# Patient Record
Sex: Female | Born: 1965 | Race: White | Hispanic: No | Smoking: Never smoker
Health system: Southern US, Community
[De-identification: ages and names within clinical notes are randomized; demographics above are authoritative.]

## PROBLEM LIST (undated history)

## (undated) ENCOUNTER — Ambulatory Visit

## (undated) DIAGNOSIS — E119 Type 2 diabetes mellitus without complications: Secondary | ICD-10-CM

## (undated) DIAGNOSIS — E079 Disorder of thyroid, unspecified: Secondary | ICD-10-CM

## (undated) DIAGNOSIS — D649 Anemia, unspecified: Secondary | ICD-10-CM

## (undated) DIAGNOSIS — K5900 Constipation, unspecified: Secondary | ICD-10-CM

## (undated) DIAGNOSIS — R232 Flushing: Secondary | ICD-10-CM

## (undated) DIAGNOSIS — E039 Hypothyroidism, unspecified: Secondary | ICD-10-CM

## (undated) DIAGNOSIS — F4310 Post-traumatic stress disorder, unspecified: Secondary | ICD-10-CM

## (undated) DIAGNOSIS — D251 Intramural leiomyoma of uterus: Secondary | ICD-10-CM

## (undated) DIAGNOSIS — K859 Acute pancreatitis without necrosis or infection, unspecified: Secondary | ICD-10-CM

## (undated) DIAGNOSIS — I10 Essential (primary) hypertension: Secondary | ICD-10-CM

## (undated) DIAGNOSIS — F32A Depression, unspecified: Secondary | ICD-10-CM

## (undated) DIAGNOSIS — R12 Heartburn: Secondary | ICD-10-CM

## (undated) DIAGNOSIS — N3941 Urge incontinence: Secondary | ICD-10-CM

## (undated) DIAGNOSIS — K219 Gastro-esophageal reflux disease without esophagitis: Secondary | ICD-10-CM

## (undated) DIAGNOSIS — R112 Nausea with vomiting, unspecified: Secondary | ICD-10-CM

## (undated) DIAGNOSIS — Z9889 Other specified postprocedural states: Secondary | ICD-10-CM

## (undated) DIAGNOSIS — F419 Anxiety disorder, unspecified: Secondary | ICD-10-CM

## (undated) DIAGNOSIS — G35 Multiple sclerosis: Secondary | ICD-10-CM

## (undated) DIAGNOSIS — N3946 Mixed incontinence: Secondary | ICD-10-CM

## (undated) DIAGNOSIS — N939 Abnormal uterine and vaginal bleeding, unspecified: Secondary | ICD-10-CM

## (undated) DIAGNOSIS — T1490XA Injury, unspecified, initial encounter: Secondary | ICD-10-CM

## (undated) DIAGNOSIS — F329 Major depressive disorder, single episode, unspecified: Secondary | ICD-10-CM

## (undated) DIAGNOSIS — R7303 Prediabetes: Secondary | ICD-10-CM

## (undated) HISTORY — PX: TUBAL LIGATION: SHX77

## (undated) HISTORY — DX: Heartburn: R12

## (undated) HISTORY — DX: Depression, unspecified: F32.A

## (undated) HISTORY — DX: Type 2 diabetes mellitus without complications: E11.9

## (undated) HISTORY — DX: Mixed incontinence: N39.46

## (undated) HISTORY — DX: Anxiety disorder, unspecified: F41.9

## (undated) HISTORY — DX: Flushing: R23.2

## (undated) HISTORY — DX: Gastro-esophageal reflux disease without esophagitis: K21.9

## (undated) HISTORY — DX: Acute pancreatitis without necrosis or infection, unspecified: K85.90

## (undated) HISTORY — DX: Injury, unspecified, initial encounter: T14.90XA

## (undated) HISTORY — DX: Abnormal uterine and vaginal bleeding, unspecified: N93.9

## (undated) HISTORY — DX: Disorder of thyroid, unspecified: E07.9

## (undated) HISTORY — DX: Major depressive disorder, single episode, unspecified: F32.9

## (undated) HISTORY — PX: ENDOMETRIAL ABLATION: SHX621

## (undated) HISTORY — DX: Urge incontinence: N39.41

## (undated) HISTORY — DX: Post-traumatic stress disorder, unspecified: F43.10

## (undated) HISTORY — DX: Constipation, unspecified: K59.00

## (undated) HISTORY — PX: INCONTINENCE SURGERY: SHX676

## (undated) HISTORY — DX: Intramural leiomyoma of uterus: D25.1

## (undated) HISTORY — DX: Multiple sclerosis: G35

---

## 2008-04-03 ENCOUNTER — Other Ambulatory Visit: Admission: RE | Admit: 2008-04-03 | Discharge: 2008-04-03 | Payer: Self-pay | Admitting: Obstetrics & Gynecology

## 2008-05-04 ENCOUNTER — Ambulatory Visit (HOSPITAL_COMMUNITY): Admission: RE | Admit: 2008-05-04 | Discharge: 2008-05-04 | Payer: Self-pay | Admitting: Internal Medicine

## 2008-06-17 ENCOUNTER — Ambulatory Visit (HOSPITAL_COMMUNITY): Admission: RE | Admit: 2008-06-17 | Discharge: 2008-06-17 | Payer: Self-pay | Admitting: Obstetrics & Gynecology

## 2008-06-17 ENCOUNTER — Encounter: Payer: Self-pay | Admitting: Obstetrics & Gynecology

## 2008-10-13 ENCOUNTER — Ambulatory Visit (HOSPITAL_COMMUNITY): Admission: RE | Admit: 2008-10-13 | Discharge: 2008-10-13 | Payer: Self-pay | Admitting: Urology

## 2008-10-13 ENCOUNTER — Encounter (INDEPENDENT_AMBULATORY_CARE_PROVIDER_SITE_OTHER): Payer: Self-pay | Admitting: Urology

## 2009-01-14 ENCOUNTER — Ambulatory Visit (HOSPITAL_COMMUNITY): Admission: RE | Admit: 2009-01-14 | Discharge: 2009-01-14 | Payer: Self-pay | Admitting: Internal Medicine

## 2009-02-05 ENCOUNTER — Emergency Department (HOSPITAL_COMMUNITY): Admission: EM | Admit: 2009-02-05 | Discharge: 2009-02-05 | Payer: Self-pay | Admitting: Emergency Medicine

## 2009-02-24 ENCOUNTER — Ambulatory Visit (HOSPITAL_COMMUNITY): Admission: RE | Admit: 2009-02-24 | Discharge: 2009-02-24 | Payer: Self-pay | Admitting: Internal Medicine

## 2010-02-22 ENCOUNTER — Ambulatory Visit (HOSPITAL_COMMUNITY): Admission: RE | Admit: 2010-02-22 | Discharge: 2010-02-22 | Payer: Self-pay | Admitting: Urology

## 2010-03-03 ENCOUNTER — Emergency Department (HOSPITAL_COMMUNITY): Admission: EM | Admit: 2010-03-03 | Discharge: 2010-03-03 | Payer: Self-pay | Admitting: Emergency Medicine

## 2010-06-10 ENCOUNTER — Other Ambulatory Visit: Admission: RE | Admit: 2010-06-10 | Discharge: 2010-06-10 | Payer: Self-pay | Admitting: Obstetrics & Gynecology

## 2010-06-17 ENCOUNTER — Ambulatory Visit (HOSPITAL_COMMUNITY): Admission: RE | Admit: 2010-06-17 | Discharge: 2010-06-17 | Payer: Self-pay | Admitting: Internal Medicine

## 2010-10-14 LAB — DIFFERENTIAL
Lymphocytes Relative: 27 % (ref 12–46)
Lymphs Abs: 1.8 10*3/uL (ref 0.7–4.0)
Monocytes Absolute: 0.5 10*3/uL (ref 0.1–1.0)
Monocytes Relative: 7 % (ref 3–12)
Neutro Abs: 4.4 10*3/uL (ref 1.7–7.7)
Neutrophils Relative %: 64 % (ref 43–77)

## 2010-10-14 LAB — BASIC METABOLIC PANEL
Calcium: 8.8 mg/dL (ref 8.4–10.5)
GFR calc Af Amer: >60 mL/min (ref >60)
GFR calc non Af Amer: >60 mL/min (ref >60)
Glucose, Bld: 97 mg/dL (ref 70–99)
Potassium: 3.5 mEq/L (ref 3.5–5.1)
Sodium: 139 mEq/L (ref 135–145)

## 2010-10-14 LAB — URINALYSIS, ROUTINE W REFLEX MICROSCOPIC
Glucose, UA: NEGATIVE mg/dL
Leukocytes, UA: NEGATIVE
Specific Gravity, Urine: >1.03 — ABNORMAL HIGH (ref 1.005–1.030)
Urobilinogen, UA: 0.2 mg/dL (ref 0.0–1.0)

## 2010-10-14 LAB — CBC
HCT: 31.9 % — ABNORMAL LOW (ref 36.0–46.0)
Hemoglobin: 10.9 g/dL — ABNORMAL LOW (ref 12.0–15.0)
RBC: 3.57 MIL/uL — ABNORMAL LOW (ref 3.87–5.11)
WBC: 6.8 10*3/uL (ref 4.0–10.5)

## 2010-10-14 LAB — URINE CULTURE

## 2010-10-14 LAB — URINE MICROSCOPIC-ADD ON

## 2010-10-15 LAB — CBC
HCT: 33.8 % — ABNORMAL LOW (ref 36.0–46.0)
Hemoglobin: 11.4 g/dL — ABNORMAL LOW (ref 12.0–15.0)
MCHC: 33.6 g/dL (ref 30.0–36.0)
MCV: 89.6 fL (ref 78.0–100.0)
RDW: 14.1 % (ref 11.5–15.5)

## 2010-10-15 LAB — BASIC METABOLIC PANEL
BUN: 11 mg/dL (ref 6–23)
Chloride: 107 mEq/L (ref 96–112)
GFR calc non Af Amer: >60 mL/min (ref >60)
Glucose, Bld: 105 mg/dL — ABNORMAL HIGH (ref 70–99)
Potassium: 4 mEq/L (ref 3.5–5.1)
Sodium: 139 mEq/L (ref 135–145)

## 2010-10-15 LAB — SURGICAL PCR SCREEN: MRSA, PCR: NEGATIVE

## 2010-11-06 LAB — CBC
Platelets: 224 10*3/uL (ref 150–400)
RDW: 12.8 % (ref 11.5–15.5)
WBC: 6.4 10*3/uL (ref 4.0–10.5)

## 2010-11-06 LAB — URINE MICROSCOPIC-ADD ON

## 2010-11-06 LAB — URINALYSIS, ROUTINE W REFLEX MICROSCOPIC
Bilirubin Urine: NEGATIVE
Glucose, UA: NEGATIVE mg/dL
Ketones, ur: NEGATIVE mg/dL
Nitrite: NEGATIVE
Specific Gravity, Urine: >1.03 — ABNORMAL HIGH (ref 1.005–1.030)
pH: 5.5 (ref 5.0–8.0)

## 2010-11-06 LAB — BASIC METABOLIC PANEL
BUN: 8 mg/dL (ref 6–23)
Calcium: 9.5 mg/dL (ref 8.4–10.5)
Creatinine, Ser: 0.75 mg/dL (ref 0.4–1.2)
GFR calc non Af Amer: >60 mL/min (ref >60)
Glucose, Bld: 90 mg/dL (ref 70–99)

## 2010-11-06 LAB — DIFFERENTIAL
Basophils Absolute: 0 10*3/uL (ref 0.0–0.1)
Eosinophils Relative: 1 % (ref 0–5)
Lymphocytes Relative: 26 % (ref 12–46)
Lymphs Abs: 1.7 10*3/uL (ref 0.7–4.0)
Neutro Abs: 4.2 10*3/uL (ref 1.7–7.7)
Neutrophils Relative %: 66 % (ref 43–77)

## 2010-11-06 LAB — URINE CULTURE: Colony Count: 70000

## 2010-11-06 LAB — GLUCOSE, CAPILLARY: Glucose-Capillary: 89 mg/dL (ref 70–99)

## 2010-11-10 LAB — BASIC METABOLIC PANEL
BUN: 12 mg/dL (ref 6–23)
CO2: 28 mEq/L (ref 19–32)
Calcium: 9.5 mg/dL (ref 8.4–10.5)
Glucose, Bld: 87 mg/dL (ref 70–99)
Sodium: 137 mEq/L (ref 135–145)

## 2010-12-13 NOTE — Op Note (Signed)
Dawn Barnes, Dawn Barnes                  ACCOUNT NO.:  [id number]   MEDICAL RECORD NO.:  [id number]          PATIENT TYPE:  AMB   LOCATION:  DAY                           FACILITY:  [facility]   PHYSICIAN:  Ky Barban, M.D.DATE OF BIRTH:  1966/05/11   DATE OF PROCEDURE:  10/13/2008  DATE OF DISCHARGE:                               OPERATIVE REPORT   PREOPERATIVE DIAGNOSES:  Periurethral cyst and stress urinary  incontinence.   POSTOPERATIVE DIAGNOSES:  Periurethral cyst and stress urinary  incontinence.   PROCEDURE:  Excision of periurethral cyst and MiniArc.   ANESTHESIA:  General.   PROCEDURE IN DETAIL:  The patient under general endotracheal anesthesia  in lithotomy position after usual prep and drape, #20 Foley catheter  introduced into the bladder and the periurethral cyst, which was about 1  cm in size was located alongside the distal urethra next to the meatus  on the right side and a small 0.5-cm incision on top of the cyst is  made.  The cyst was dissected with blunt and sharp dissection, was  removed from the tissues.  The cyst was removed and then the bleeders  were coagulated and the periurethral tissue was closed with a couple of  stitches of 3-0 Vicryl to get hemostasis.  Then the mucosa was closed  with a couple of stitches of 3-0 Vicryl.  Then I proceeded to the  MiniArc procedure.  A 1-cm incision on top of the mid urethra was made  and periurethral tissues developed on the right and left side of the  urethra and through this periurethral area, first I introduced the  special curved needle with MiniArc, tape on it on the right side and  pushed it down alongside the pubic ramus going up into the upper part  near the obturator fossa and pushed into the obturator fossa just  hugging the lower end of the fossa.  The tape was in place and the  needle was removed and the other end of the tape was introduced on the  left side in the same fashion, nicely snugged tape to the  urethra was  obtained.  At this point, the wound was irrigated and the urethral  incision was closed with a couple of stitches of 3-0 Vicryl.  Foley  catheter was removed.  The patient left the operating room in  satisfactory condition.      Ky Barban, M.D.  Electronically Signed     MIJ/MEDQ  D:  10/13/2008  T:  10/14/2008  Job:  [id number]

## 2010-12-13 NOTE — H&P (Signed)
NAMEMORENIKE, CUFF                  ACCOUNT NO.:  [id number]   MEDICAL RECORD NO.:  [id number]          PATIENT TYPE:  AMB   LOCATION:  DAY                           FACILITY:  [facility]   PHYSICIAN:  Ky Barban, M.D.DATE OF BIRTH:  [birth year]   DATE OF ADMISSION:  DATE OF DISCHARGE:  LH                              HISTORY & PHYSICAL   CHIEF COMPLAINT:  Stress urinary incontinence.   A 45 year old female has been followed by me since 2008.  She was having  mostly urge incontinence treated well with anticholinergic drugs but  lately she has developed worsening stress incontinence.  Recently I  worked her up.  She has marked stress incontinence which can go away  with the elevation of the bladder neck.  She also has a small  periurethral cyst so I told her that we need to remove the cyst and do a  MiniArc procedure.  It will correct her stress incontinence but not urge  incontinence for which she will keep taking anticholinergic drugs okay  and I described the procedure limitation complications, no guarantees  about the results.  She understands and wanted me to go and proceed.  She is coming as outpatient.  We will do the procedure tomorrow as  outpatient.   PAST MEDICAL HISTORY:  History of having tubal ligation several years  ago.  No diabetes or hypertension.  She takes medication for  hypothyroidism and GERD.   PERSONAL HISTORY:  She does not smoke or drink.   REVIEW OF SYSTEMS:  Unremarkable.   EXAMINATION:  Blood pressure 120/80.  Temperature is normal.  CENTRAL NERVOUS SYSTEM:  Negative.  HEAD, NECK, ENT:  Negative.  CHEST:  Symmetrical.  HEART:  Regular sinus rhythm.  No murmur.  ABDOMEN:  Soft, flat.  Liver, spleen, kidneys are not palpable.  No CVA  tenderness.  PELVIC EXAM:  No adnexal mass.  She has a small cyst below the meatus.  It is nontender.  It is not a urethral diverticulum.   IMPRESSION:  1. Periurethral cyst.  2. Stress incontinence.   PLAN:   Removal of periurethral cyst and insertion of MiniArc under  anesthesia as outpatient.  She will be given 200 mg of Cipro IV on  arrival.      Ky Barban, M.D.  Electronically Signed     MIJ/MEDQ  D:  10/12/2008  T:  10/12/2008  Job:  [id number]

## 2010-12-13 NOTE — Op Note (Signed)
[name], Dawn Barnes                  ACCOUNT NO.:  [id number]   MEDICAL RECORD NO.:  [id number]          PATIENT TYPE:  AMB   LOCATION:  DAY                           FACILITY:  [facility]   PHYSICIAN:  Lazaro Arms, M.D.   DATE OF BIRTH:  [birth year]   DATE OF PROCEDURE:  06/17/2008  DATE OF DISCHARGE:                               OPERATIVE REPORT   PREOPERATIVE DIAGNOSES:  1. Menometrorrhagia.  2. Dysmenorrhea.  3. Anemia.   POSTOPERATIVE DIAGNOSES:  1. Menometrorrhagia.  2. Dysmenorrhea.  3. Anemia.   PROCEDURE:  Hysteroscopy, D and C, and endometrial ablation.   SURGEON:  Lazaro Arms, MD   ANESTHESIA:  General endotracheal.   FINDINGS:  The patient had normal endometrium except for what looked  like to be a necrotic degenerating polyp.  It could have just been a  shaggy endometrium.   DESCRIPTION OF OPERATION:  The patient was taken to the operating room,  placed in the supine position, where she underwent general endotracheal  anesthesia placed in dorsal supine position prepped and draped in usual  sterile fashion.  She was noted to have __________her bottom at the end  of the procedure.  Graves speculum was placed.  Cervix was grasped with  a dilated sleeve to allow passes of hysteroscope.  The hysteroscopy was  performed and found to be the above noted findings.  A vigorous uterine  curettage was then performed.  A ThermaChoice III endometrial ablation  balloon was used.  A 26 mL of D5W was required to maintain a pressure of  greater than 200 mmHg.  It was heated to 87 degrees Celsius.  Total  therapy time was 9 minutes and 30 seconds.  All the fluid was returned  at the end of the procedure.  The patient appeared well __________.  She  was awakened from anesthesia and taken to recovery room in good and  stable condition.  All counts were correct.  She received Ancef and  Toradol prophylactically.  __________was placed at the bottom in the  end.     Lazaro Arms, M.D.  Electronically Signed    LHE/MEDQ  D:  06/17/2008  T:  06/17/2008  Job:  [id number]

## 2010-12-30 IMAGING — CR DG TOE GREAT 2+V*R*
2 series · 2 of 2 positions shown · non-contrast
Comparison: none

CLINICAL DATA: Right great toe injury 2 days ago.  Toe pain and
bruising.

RIGHT TOE - 2+ VIEW

[view not recorded (1 of 2)]
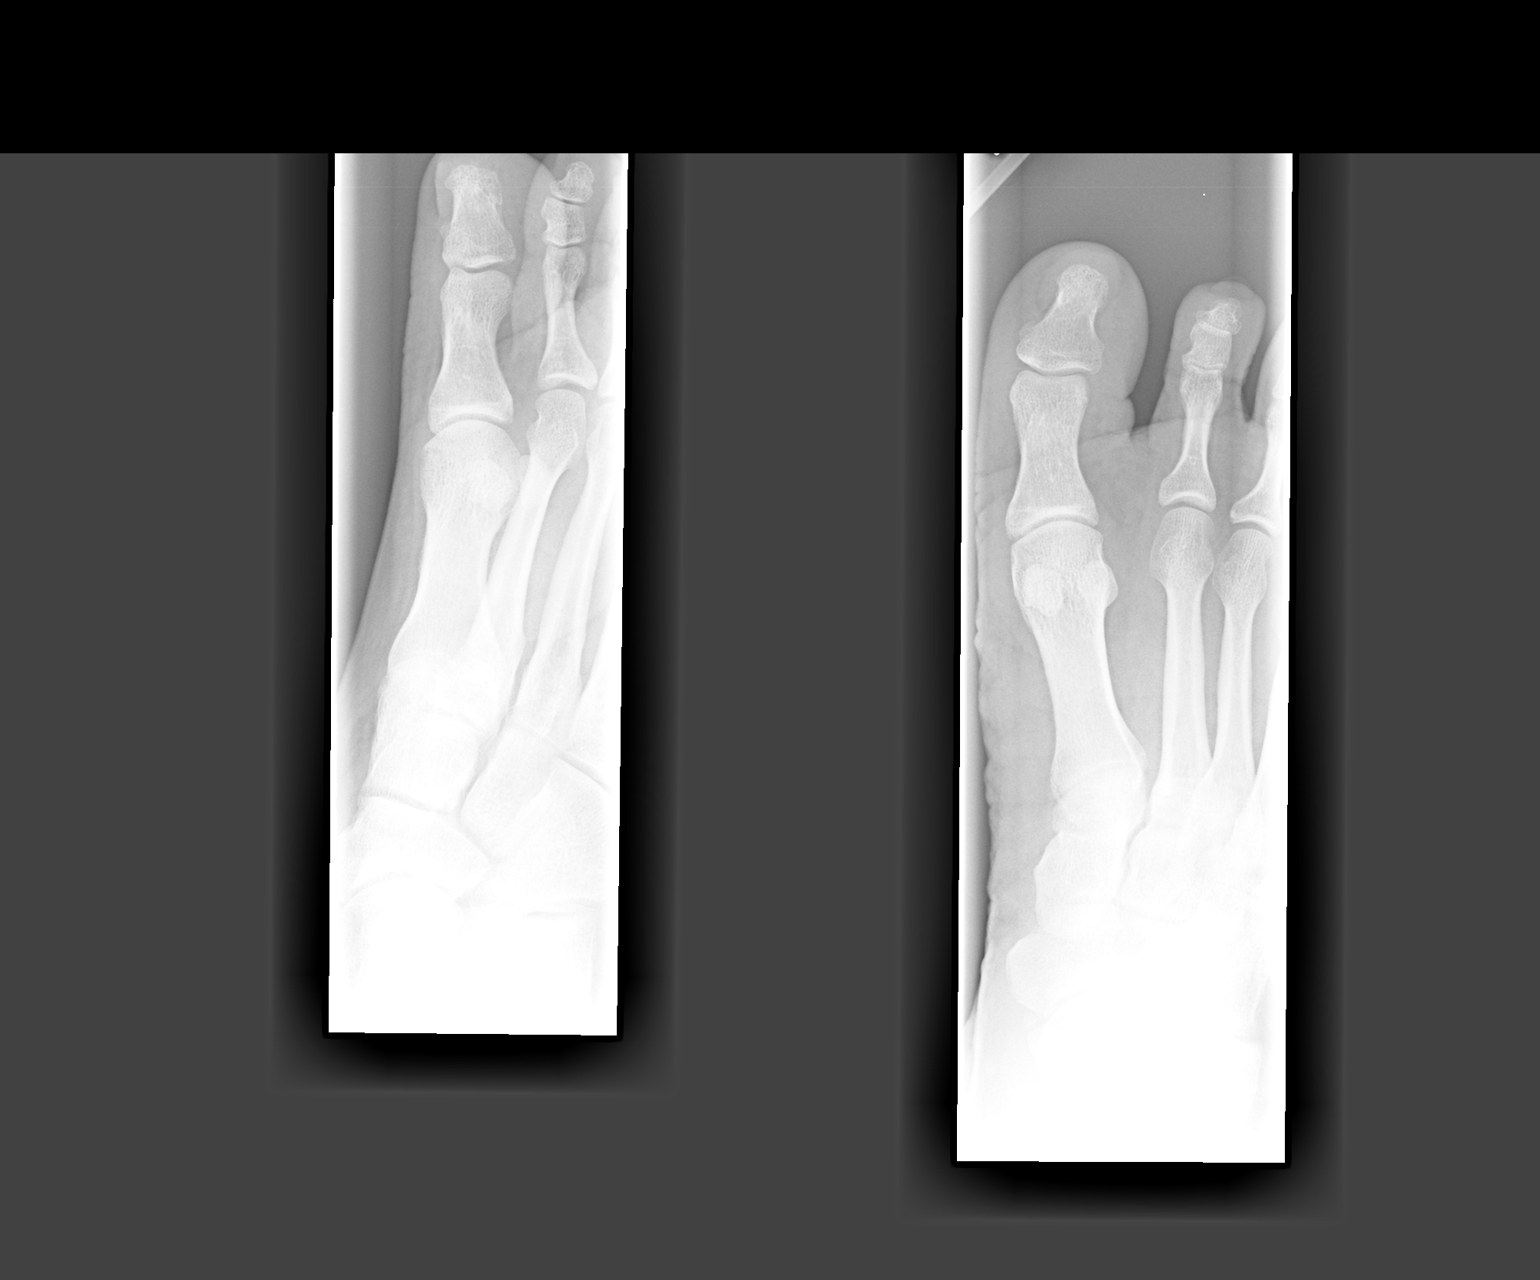

[view not recorded (2 of 2)]
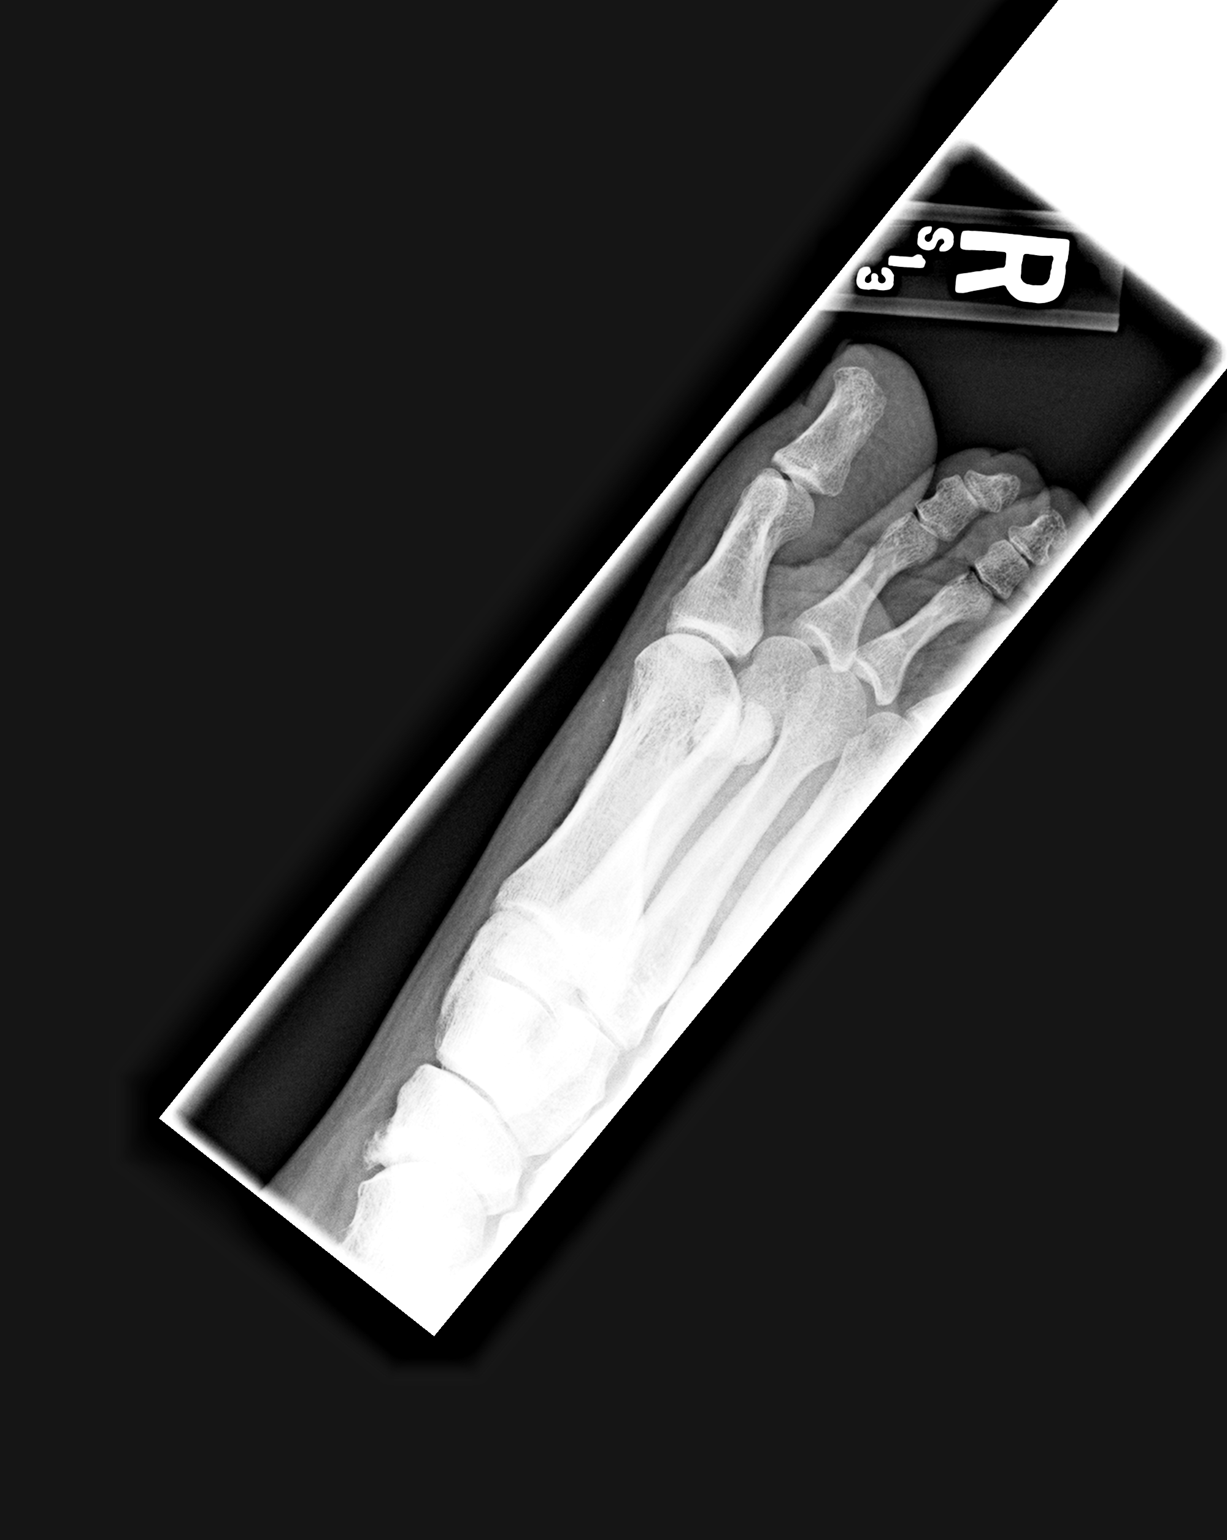

[2 of 2 positions shown; findings below may reference images not displayed]

FINDINGS: No fracture, foreign body, or acute bony findings are
identified. Accessory navicular noted.
IMPRESSION: 1.  No discrete fracture or dislocation is identified.

## 2011-03-21 ENCOUNTER — Other Ambulatory Visit (HOSPITAL_COMMUNITY): Payer: Self-pay | Admitting: Internal Medicine

## 2011-03-21 ENCOUNTER — Ambulatory Visit (HOSPITAL_COMMUNITY)
Admission: RE | Admit: 2011-03-21 | Discharge: 2011-03-21 | Disposition: A | Discharge status: Home / Self Care | Payer: Self-pay | Source: Ambulatory Visit | Attending: Internal Medicine | Admitting: Internal Medicine

## 2011-03-21 DIAGNOSIS — M79672 Pain in left foot: Secondary | ICD-10-CM

## 2011-03-21 DIAGNOSIS — M25579 Pain in unspecified ankle and joints of unspecified foot: Secondary | ICD-10-CM | POA: Insufficient documentation

## 2011-03-21 DIAGNOSIS — M898X9 Other specified disorders of bone, unspecified site: Secondary | ICD-10-CM | POA: Insufficient documentation

## 2011-05-02 LAB — CBC
Hemoglobin: 11.1 — ABNORMAL LOW
MCHC: 34.1
MCV: 86.6
RBC: 3.75 — ABNORMAL LOW
RDW: 14.7

## 2011-05-02 LAB — BASIC METABOLIC PANEL
CO2: 25
Calcium: 9.3
Chloride: 110
Creatinine, Ser: 0.67
GFR calc Af Amer: >60
Glucose, Bld: 97

## 2011-05-02 LAB — URINALYSIS, DIPSTICK ONLY
Bilirubin Urine: NEGATIVE
Nitrite: NEGATIVE
Protein, ur: NEGATIVE
Urobilinogen, UA: 0.2

## 2011-07-06 ENCOUNTER — Other Ambulatory Visit (HOSPITAL_COMMUNITY): Payer: Self-pay | Admitting: Internal Medicine

## 2011-07-06 DIAGNOSIS — Z139 Encounter for screening, unspecified: Secondary | ICD-10-CM

## 2011-07-14 ENCOUNTER — Ambulatory Visit (HOSPITAL_COMMUNITY): Payer: Medicaid Other

## 2011-08-07 ENCOUNTER — Ambulatory Visit (HOSPITAL_COMMUNITY): Payer: Medicaid Other

## 2011-09-25 ENCOUNTER — Ambulatory Visit (HOSPITAL_COMMUNITY): Payer: Medicaid Other

## 2011-10-03 ENCOUNTER — Ambulatory Visit (HOSPITAL_COMMUNITY)
Admission: RE | Admit: 2011-10-03 | Discharge: 2011-10-03 | Disposition: A | Discharge status: Home / Self Care | Payer: Medicaid Other | Source: Ambulatory Visit | Attending: Internal Medicine | Admitting: Internal Medicine

## 2011-10-03 DIAGNOSIS — Z139 Encounter for screening, unspecified: Secondary | ICD-10-CM

## 2011-10-03 DIAGNOSIS — Z1231 Encounter for screening mammogram for malignant neoplasm of breast: Secondary | ICD-10-CM | POA: Insufficient documentation

## 2011-11-09 ENCOUNTER — Other Ambulatory Visit: Payer: Self-pay | Admitting: Adult Health

## 2011-11-09 ENCOUNTER — Other Ambulatory Visit (HOSPITAL_COMMUNITY)
Admission: RE | Admit: 2011-11-09 | Discharge: 2011-11-09 | Disposition: A | Discharge status: Home / Self Care | Payer: Medicaid Other | Source: Ambulatory Visit | Attending: Obstetrics and Gynecology | Admitting: Obstetrics and Gynecology

## 2011-11-09 DIAGNOSIS — Z1159 Encounter for screening for other viral diseases: Secondary | ICD-10-CM | POA: Insufficient documentation

## 2011-11-09 DIAGNOSIS — Z01419 Encounter for gynecological examination (general) (routine) without abnormal findings: Secondary | ICD-10-CM | POA: Insufficient documentation

## 2012-03-12 ENCOUNTER — Other Ambulatory Visit (HOSPITAL_COMMUNITY): Payer: Self-pay | Admitting: Internal Medicine

## 2012-03-12 ENCOUNTER — Ambulatory Visit (HOSPITAL_COMMUNITY)
Admission: RE | Admit: 2012-03-12 | Discharge: 2012-03-12 | Disposition: A | Discharge status: Home / Self Care | Payer: Medicaid Other | Source: Ambulatory Visit | Attending: Internal Medicine | Admitting: Internal Medicine

## 2012-03-12 DIAGNOSIS — M79644 Pain in right finger(s): Secondary | ICD-10-CM

## 2012-03-12 DIAGNOSIS — M79609 Pain in unspecified limb: Secondary | ICD-10-CM | POA: Insufficient documentation

## 2012-12-25 ENCOUNTER — Other Ambulatory Visit (HOSPITAL_COMMUNITY): Payer: Self-pay | Admitting: Internal Medicine

## 2012-12-25 DIAGNOSIS — Z139 Encounter for screening, unspecified: Secondary | ICD-10-CM

## 2013-01-07 ENCOUNTER — Ambulatory Visit (HOSPITAL_COMMUNITY)
Admission: RE | Admit: 2013-01-07 | Discharge: 2013-01-07 | Disposition: A | Discharge status: Home / Self Care | Payer: Medicaid Other | Source: Ambulatory Visit | Attending: Internal Medicine | Admitting: Internal Medicine

## 2013-01-07 DIAGNOSIS — Z139 Encounter for screening, unspecified: Secondary | ICD-10-CM

## 2013-01-07 DIAGNOSIS — Z1231 Encounter for screening mammogram for malignant neoplasm of breast: Secondary | ICD-10-CM | POA: Insufficient documentation

## 2013-02-20 ENCOUNTER — Other Ambulatory Visit: Payer: Self-pay | Admitting: Adult Health

## 2013-04-25 ENCOUNTER — Other Ambulatory Visit (HOSPITAL_COMMUNITY): Payer: Self-pay | Admitting: Internal Medicine

## 2013-04-25 ENCOUNTER — Ambulatory Visit (HOSPITAL_COMMUNITY)
Admission: RE | Admit: 2013-04-25 | Discharge: 2013-04-25 | Disposition: A | Discharge status: Home / Self Care | Payer: Medicaid Other | Source: Ambulatory Visit | Attending: Internal Medicine | Admitting: Internal Medicine

## 2013-04-25 DIAGNOSIS — M549 Dorsalgia, unspecified: Secondary | ICD-10-CM

## 2013-04-25 DIAGNOSIS — R209 Unspecified disturbances of skin sensation: Secondary | ICD-10-CM | POA: Insufficient documentation

## 2013-04-25 DIAGNOSIS — M545 Low back pain, unspecified: Secondary | ICD-10-CM | POA: Insufficient documentation

## 2013-04-28 ENCOUNTER — Other Ambulatory Visit: Payer: Self-pay | Admitting: Adult Health

## 2013-04-30 ENCOUNTER — Encounter: Payer: Self-pay | Admitting: Adult Health

## 2013-04-30 ENCOUNTER — Ambulatory Visit (INDEPENDENT_AMBULATORY_CARE_PROVIDER_SITE_OTHER): Payer: Medicaid Other | Admitting: Adult Health

## 2013-04-30 VITALS — BP 130/80 | HR 74 | Ht 64.5 in | Wt 196.0 lb

## 2013-04-30 DIAGNOSIS — F32A Depression, unspecified: Secondary | ICD-10-CM | POA: Insufficient documentation

## 2013-04-30 DIAGNOSIS — F329 Major depressive disorder, single episode, unspecified: Secondary | ICD-10-CM | POA: Insufficient documentation

## 2013-04-30 DIAGNOSIS — Z0000 Encounter for general adult medical examination without abnormal findings: Secondary | ICD-10-CM

## 2013-04-30 DIAGNOSIS — E039 Hypothyroidism, unspecified: Secondary | ICD-10-CM | POA: Insufficient documentation

## 2013-04-30 DIAGNOSIS — K5900 Constipation, unspecified: Secondary | ICD-10-CM

## 2013-04-30 DIAGNOSIS — Z1212 Encounter for screening for malignant neoplasm of rectum: Secondary | ICD-10-CM

## 2013-04-30 DIAGNOSIS — Z01419 Encounter for gynecological examination (general) (routine) without abnormal findings: Secondary | ICD-10-CM

## 2013-04-30 HISTORY — DX: Constipation, unspecified: K59.00

## 2013-04-30 LAB — HEMOCCULT GUIAC POC 1CARD (OFFICE)
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
## 2013-04-30 NOTE — Patient Instructions (Addendum)
Physical in 1 year Mammogram yearly Labs with PCP Colonoscopy at 51  Try prune juice Constipation, Adult Constipation is when a person has fewer than 3 bowel movements a week; has difficulty having a bowel movement; or has stools that are dry, hard, or larger than normal. As people grow older, constipation is more common. If you try to fix constipation with medicines that make you have a bowel movement (laxatives), the problem may get worse. Long-term laxative use may cause the muscles of the colon to become weak. A low-fiber diet, not taking in enough fluids, and taking certain medicines may make constipation worse. CAUSES   Certain medicines, such as antidepressants, pain medicine, iron supplements, antacids, and water pills.   Certain diseases, such as diabetes, irritable bowel syndrome (IBS), thyroid disease, or depression.   Not drinking enough water.   Not eating enough fiber-rich foods.   Stress or travel.  Lack of physical activity or exercise.  Not going to the restroom when there is the urge to have a bowel movement.  Ignoring the urge to have a bowel movement.  Using laxatives too much. SYMPTOMS   Having fewer than 3 bowel movements a week.   Straining to have a bowel movement.   Having hard, dry, or larger than normal stools.   Feeling full or bloated.   Pain in the lower abdomen.  Not feeling relief after having a bowel movement. DIAGNOSIS  Your caregiver will take a medical history and perform a physical exam. Further testing may be done for severe constipation. Some tests may include:   A barium enema X-ray to examine your rectum, colon, and sometimes, your small intestine.  A sigmoidoscopy to examine your lower colon.  A colonoscopy to examine your entire colon. TREATMENT  Treatment will depend on the severity of your constipation and what is causing it. Some dietary treatments include drinking more fluids and eating more fiber-rich foods.  Lifestyle treatments may include regular exercise. If these diet and lifestyle recommendations do not help, your caregiver may recommend taking over-the-counter laxative medicines to help you have bowel movements. Prescription medicines may be prescribed if over-the-counter medicines do not work.  HOME CARE INSTRUCTIONS   Increase dietary fiber in your diet, such as fruits, vegetables, whole grains, and beans. Limit high-fat and processed sugars in your diet, such as [place] fries, hamburgers, cookies, candies, and soda.   A fiber supplement may be added to your diet if you cannot get enough fiber from foods.   Drink enough fluids to keep your urine clear or pale yellow.   Exercise regularly or as directed by your caregiver.   Go to the restroom when you have the urge to go. Do not hold it.  Only take medicines as directed by your caregiver. Do not take other medicines for constipation without talking to your caregiver first. SEEK IMMEDIATE MEDICAL CARE IF:   You have bright red blood in your stool.   Your constipation lasts for more than 4 days or gets worse.   You have abdominal or rectal pain.   You have thin, pencil-like stools.  You have unexplained weight loss. MAKE SURE YOU:   Understand these instructions.  Will watch your condition.  Will get help right away if you are not doing well or get worse. Document Released: 04/14/2004 Document Revised: 10/09/2011 Document Reviewed: 06/20/2011 [facility] Patient Information 2014 [place], [place].

## 2013-10-14 ENCOUNTER — Ambulatory Visit (HOSPITAL_COMMUNITY): Payer: Medicaid Other | Attending: *Deleted | Admitting: Physical Therapy

## 2013-10-23 ENCOUNTER — Ambulatory Visit (HOSPITAL_COMMUNITY)
Admission: RE | Admit: 2013-10-23 | Discharge: 2013-10-23 | Disposition: A | Discharge status: Home / Self Care | Payer: Medicaid Other | Source: Ambulatory Visit | Attending: Internal Medicine | Admitting: Internal Medicine

## 2013-10-23 DIAGNOSIS — M545 Low back pain, unspecified: Secondary | ICD-10-CM | POA: Insufficient documentation

## 2013-10-23 DIAGNOSIS — M6281 Muscle weakness (generalized): Secondary | ICD-10-CM | POA: Insufficient documentation

## 2013-10-23 DIAGNOSIS — IMO0001 Reserved for inherently not codable concepts without codable children: Secondary | ICD-10-CM | POA: Insufficient documentation

## 2013-10-23 DIAGNOSIS — M5410 Radiculopathy, site unspecified: Secondary | ICD-10-CM | POA: Insufficient documentation

## 2013-10-23 NOTE — Evaluation (Signed)
Physical Therapy Evaluation  Patient Details  Name: Dawn Barnes MRN: [id number] Date of Birth: Nov 01, 1965  Today's Date: 10/23/2013 Time: 0940-1010 PT Time Calculation (min): 30 min Charge:  evaluation             Visit#: 1 of 1  Re-eval:   Assessment Diagnosis: back pain  Authorization: medicaid    Past Medical History:  Past Medical History  Diagnosis Date  . Depression   . Anxiety   . Thyroid disease   . Heartburn   . Mixed incontinence   . Constipation 04/30/2013   Past Surgical History:  Past Surgical History  Procedure Laterality Date  . Incontinence surgery    . Tubal ligation    . Endometrial ablation      Subjective Symptoms/Limitations Symptoms: Pt has a one year hx of back pain that radiates to the Rt LE  How long can you sit comfortably?: able to sit an hour How long can you stand comfortably?: able to stand for 10-15 minutes How long can you walk comfortably?: walking helps Pain Assessment Currently in Pain?: Yes Pain Score: 3  Pain Location: Back Pain Orientation: Right Flexibility 90/90: Negative  Assessment RLE Strength Right Hip Flexion: 3+/5 Right Hip Extension: 3+/5 Right Hip ABduction: 3+/5 Right Knee Flexion: 3+/5 Right Knee Extension: 3+/5 Right Ankle Dorsiflexion: 5/5 LLE Strength Left Hip Flexion: 3+/5 Left Hip Extension: 3+/5 Left Hip ABduction: 3+/5 Left Knee Flexion: 5/5 Left Knee Extension: 5/5 Left Ankle Dorsiflexion: 5/5 Lumbar AROM Lumbar Flexion: wnl Lumbar Extension: wnl reps no change Lumbar - Right Side Bend: wnl Lumbar - Left Side Bend: wnl Lumbar - Right Rotation: wnl Lumbar - Left Rotation: wnl  Exercise/Treatments      Stretches Single Knee to Chest Stretch: 3 reps;30 seconds Press Ups: 5 reps Supine Ab Set: 5 reps Bent Knee Raise: 5 reps Bridge: 5 reps Sidelying Hip Abduction: 5 reps Prone  Straight Leg Raise: 5 reps     Physical Therapy Assessment and Plan PT Assessment and Plan Clinical  Impression Statement: Pt referred for Low back pain with radiculopathy.  Pt has decreased core and lower extremity strength B.  Pt was seen for a one time treatment due to insurance to educate in core strengthening and body mechanics.  PT Plan: D/C to [facility]    Goals Home Exercise Program Pt/caregiver will Perform Home Exercise Program: For increased strengthening PT Goal: Perform Home Exercise Program - Progress: Goal set today  Problem List Patient Active Problem List   Diagnosis Date Noted  . Radicular low back pain 10/23/2013  . Depression 04/30/2013  . Hypothyroid 04/30/2013  . Constipation 04/30/2013    PT Plan of Care PT Home Exercise Plan: given  GP    RUSSELL,CINDY 10/23/2013, 10:16 AM  Physician Documentation Your signature is required to indicate approval of the treatment plan as stated above.  Please sign and either send electronically or make a copy of this report for your files and return this physician signed original.   Please mark one 1.__approve of plan  2. ___approve of plan with the following conditions.   ______________________________                                                          _____________________ Physician Signature  Date  

## 2014-01-21 ENCOUNTER — Other Ambulatory Visit (HOSPITAL_COMMUNITY): Payer: Self-pay | Admitting: Internal Medicine

## 2014-01-21 DIAGNOSIS — Z1231 Encounter for screening mammogram for malignant neoplasm of breast: Secondary | ICD-10-CM

## 2014-02-02 ENCOUNTER — Ambulatory Visit (HOSPITAL_COMMUNITY)
Admission: RE | Admit: 2014-02-02 | Discharge: 2014-02-02 | Disposition: A | Discharge status: Home / Self Care | Payer: Medicaid Other | Source: Ambulatory Visit | Attending: Internal Medicine | Admitting: Internal Medicine

## 2014-02-02 DIAGNOSIS — Z1231 Encounter for screening mammogram for malignant neoplasm of breast: Secondary | ICD-10-CM | POA: Diagnosis not present

## 2014-05-04 ENCOUNTER — Encounter: Payer: Self-pay | Admitting: Adult Health

## 2014-05-04 ENCOUNTER — Other Ambulatory Visit (HOSPITAL_COMMUNITY)
Admission: RE | Admit: 2014-05-04 | Discharge: 2014-05-04 | Disposition: A | Discharge status: Home / Self Care | Payer: Medicaid Other | Source: Ambulatory Visit | Attending: Adult Health | Admitting: Adult Health

## 2014-05-04 ENCOUNTER — Ambulatory Visit (INDEPENDENT_AMBULATORY_CARE_PROVIDER_SITE_OTHER): Payer: Medicaid Other | Admitting: Adult Health

## 2014-05-04 VITALS — BP 116/74 | HR 76 | Ht 64.0 in | Wt 188.0 lb

## 2014-05-04 DIAGNOSIS — Z01419 Encounter for gynecological examination (general) (routine) without abnormal findings: Secondary | ICD-10-CM | POA: Insufficient documentation

## 2014-05-04 DIAGNOSIS — Z1212 Encounter for screening for malignant neoplasm of rectum: Secondary | ICD-10-CM

## 2014-05-04 DIAGNOSIS — Z1151 Encounter for screening for human papillomavirus (HPV): Secondary | ICD-10-CM | POA: Insufficient documentation

## 2014-05-04 DIAGNOSIS — Z0000 Encounter for general adult medical examination without abnormal findings: Secondary | ICD-10-CM

## 2014-05-04 LAB — HEMOCCULT GUIAC POC 1CARD (OFFICE): Fecal Occult Blood, POC: NEGATIVE

## 2014-05-04 NOTE — Patient Instructions (Signed)
Physical in 1 year Mammogram yearly  Colonoscopy at 25 Labs PCP
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
## 2014-05-05 LAB — CYTOLOGY - PAP

## 2014-06-01 ENCOUNTER — Encounter: Payer: Self-pay | Admitting: Adult Health

## 2014-09-16 ENCOUNTER — Ambulatory Visit (HOSPITAL_COMMUNITY)
Admission: RE | Admit: 2014-09-16 | Discharge: 2014-09-16 | Disposition: A | Discharge status: Home / Self Care | Payer: Medicaid Other | Source: Ambulatory Visit | Attending: Internal Medicine | Admitting: Internal Medicine

## 2014-09-16 ENCOUNTER — Other Ambulatory Visit (HOSPITAL_COMMUNITY): Payer: Self-pay | Admitting: Internal Medicine

## 2014-09-16 DIAGNOSIS — M542 Cervicalgia: Secondary | ICD-10-CM

## 2014-09-16 DIAGNOSIS — M79602 Pain in left arm: Secondary | ICD-10-CM | POA: Diagnosis not present

## 2014-09-16 DIAGNOSIS — M5032 Other cervical disc degeneration, mid-cervical region: Secondary | ICD-10-CM | POA: Insufficient documentation

## 2014-12-07 ENCOUNTER — Other Ambulatory Visit: Payer: Self-pay | Admitting: Neurology

## 2014-12-07 DIAGNOSIS — G35 Multiple sclerosis: Secondary | ICD-10-CM

## 2014-12-21 ENCOUNTER — Ambulatory Visit (HOSPITAL_COMMUNITY)
Admission: RE | Admit: 2014-12-21 | Discharge: 2014-12-21 | Disposition: A | Discharge status: Home / Self Care | Payer: Medicaid Other | Source: Ambulatory Visit | Attending: Neurology | Admitting: Neurology

## 2014-12-21 DIAGNOSIS — R9089 Other abnormal findings on diagnostic imaging of central nervous system: Secondary | ICD-10-CM | POA: Diagnosis not present

## 2014-12-21 DIAGNOSIS — R200 Anesthesia of skin: Secondary | ICD-10-CM | POA: Diagnosis present

## 2014-12-21 DIAGNOSIS — R51 Headache: Secondary | ICD-10-CM | POA: Insufficient documentation

## 2014-12-21 DIAGNOSIS — G35 Multiple sclerosis: Secondary | ICD-10-CM

## 2014-12-21 MED ORDER — GADOBENATE DIMEGLUMINE 529 MG/ML IV SOLN
18.0000 mL | Freq: Once | INTRAVENOUS | Status: AC | PRN
  Administered 2014-12-21: 18 mL via INTRAVENOUS

## 2015-01-06 ENCOUNTER — Other Ambulatory Visit (HOSPITAL_COMMUNITY): Payer: Self-pay | Admitting: Internal Medicine

## 2015-01-06 DIAGNOSIS — Z1231 Encounter for screening mammogram for malignant neoplasm of breast: Secondary | ICD-10-CM

## 2015-01-18 ENCOUNTER — Encounter (HOSPITAL_COMMUNITY)
Admission: RE | Admit: 2015-01-18 | Discharge: 2015-01-18 | Disposition: A | Discharge status: Home / Self Care | Payer: Medicaid Other | Source: Ambulatory Visit | Attending: Neurology | Admitting: Neurology

## 2015-01-18 DIAGNOSIS — G35 Multiple sclerosis: Secondary | ICD-10-CM | POA: Insufficient documentation

## 2015-01-18 MED ORDER — SODIUM CHLORIDE 0.9 % IV SOLN
Freq: Once | INTRAVENOUS | Status: DC
  Filled 2015-01-18: qty 16

## 2015-01-18 MED ORDER — METHYLPREDNISOLONE SODIUM SUCC 125 MG IJ SOLR: 1000.0000 mg | Freq: Once | INTRAMUSCULAR | Status: DC

## 2015-01-18 MED ORDER — METHYLPREDNISOLONE SODIUM SUCC 1000 MG IJ SOLR
1000.0000 mg | Freq: Once | INTRAMUSCULAR | Status: DC
  Filled 2015-01-18: qty 8

## 2015-01-19 ENCOUNTER — Encounter (HOSPITAL_COMMUNITY)
Admission: RE | Admit: 2015-01-19 | Discharge: 2015-01-19 | Disposition: A | Discharge status: Home / Self Care | Payer: Medicaid Other | Source: Ambulatory Visit | Attending: Neurology | Admitting: Neurology

## 2015-01-19 DIAGNOSIS — G35 Multiple sclerosis: Secondary | ICD-10-CM | POA: Diagnosis not present

## 2015-01-19 MED ORDER — METHYLPREDNISOLONE SODIUM SUCC 125 MG IJ SOLR: 1000.0000 mg | Freq: Once | INTRAMUSCULAR | Status: DC

## 2015-01-19 MED ORDER — METHYLPREDNISOLONE SODIUM SUCC 1000 MG IJ SOLR
1000.0000 mg | Freq: Once | INTRAMUSCULAR | Status: AC
  Administered 2015-01-19: 1000 mg via INTRAVENOUS
  Filled 2015-01-19: qty 8

## 2015-01-20 ENCOUNTER — Encounter (HOSPITAL_COMMUNITY)
Admission: RE | Admit: 2015-01-20 | Discharge: 2015-01-20 | Disposition: A | Discharge status: Home / Self Care | Payer: Medicaid Other | Source: Ambulatory Visit | Attending: Neurology | Admitting: Neurology

## 2015-01-20 DIAGNOSIS — G35 Multiple sclerosis: Secondary | ICD-10-CM | POA: Diagnosis not present

## 2015-01-20 MED ORDER — SODIUM CHLORIDE 0.9 % IV SOLN
1000.0000 mg | Freq: Once | INTRAVENOUS | Status: AC
  Administered 2015-01-20: 1000 mg via INTRAVENOUS
  Filled 2015-01-20: qty 8

## 2015-01-20 MED ORDER — SODIUM CHLORIDE 0.9 % IV SOLN
INTRAVENOUS | Status: DC
  Administered 2015-01-20: 200 mL via INTRAVENOUS

## 2015-02-08 ENCOUNTER — Ambulatory Visit (HOSPITAL_COMMUNITY): Payer: Medicaid Other

## 2015-02-12 ENCOUNTER — Ambulatory Visit (HOSPITAL_COMMUNITY)
Admission: RE | Admit: 2015-02-12 | Discharge: 2015-02-12 | Disposition: A | Discharge status: Home / Self Care | Payer: Medicaid Other | Source: Ambulatory Visit | Attending: Internal Medicine | Admitting: Internal Medicine

## 2015-02-12 DIAGNOSIS — Z1231 Encounter for screening mammogram for malignant neoplasm of breast: Secondary | ICD-10-CM | POA: Diagnosis present

## 2015-05-10 ENCOUNTER — Ambulatory Visit (INDEPENDENT_AMBULATORY_CARE_PROVIDER_SITE_OTHER): Payer: Medicaid Other | Admitting: Adult Health

## 2015-05-10 ENCOUNTER — Encounter: Payer: Self-pay | Admitting: Adult Health

## 2015-05-10 VITALS — BP 108/60 | HR 68 | Ht 64.0 in | Wt 193.5 lb

## 2015-05-10 DIAGNOSIS — Z01419 Encounter for gynecological examination (general) (routine) without abnormal findings: Secondary | ICD-10-CM

## 2015-05-10 DIAGNOSIS — Z1212 Encounter for screening for malignant neoplasm of rectum: Secondary | ICD-10-CM | POA: Diagnosis not present

## 2015-05-10 DIAGNOSIS — Z0000 Encounter for general adult medical examination without abnormal findings: Secondary | ICD-10-CM | POA: Diagnosis not present

## 2015-05-10 DIAGNOSIS — N3941 Urge incontinence: Secondary | ICD-10-CM

## 2015-05-10 HISTORY — DX: Urge incontinence: N39.41

## 2015-05-10 LAB — HEMOCCULT GUIAC POC 1CARD (OFFICE): Fecal Occult Blood, POC: NEGATIVE
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
## 2015-05-10 NOTE — Patient Instructions (Signed)
Physical in 1 year Mammogram yearly Labs with PCP 

## 2015-05-30 ENCOUNTER — Encounter (HOSPITAL_COMMUNITY): Payer: Self-pay | Admitting: Emergency Medicine

## 2015-05-30 ENCOUNTER — Emergency Department (HOSPITAL_COMMUNITY)
Admission: EM | Admit: 2015-05-30 | Discharge: 2015-05-30 | Disposition: A | Discharge status: Home / Self Care | Payer: Medicaid Other | Attending: Emergency Medicine | Admitting: Emergency Medicine

## 2015-05-30 DIAGNOSIS — Z792 Long term (current) use of antibiotics: Secondary | ICD-10-CM | POA: Diagnosis not present

## 2015-05-30 DIAGNOSIS — R143 Flatulence: Secondary | ICD-10-CM | POA: Diagnosis not present

## 2015-05-30 DIAGNOSIS — R109 Unspecified abdominal pain: Secondary | ICD-10-CM | POA: Diagnosis not present

## 2015-05-30 DIAGNOSIS — N3941 Urge incontinence: Secondary | ICD-10-CM | POA: Insufficient documentation

## 2015-05-30 DIAGNOSIS — Z8719 Personal history of other diseases of the digestive system: Secondary | ICD-10-CM | POA: Diagnosis not present

## 2015-05-30 DIAGNOSIS — F329 Major depressive disorder, single episode, unspecified: Secondary | ICD-10-CM | POA: Diagnosis not present

## 2015-05-30 DIAGNOSIS — E039 Hypothyroidism, unspecified: Secondary | ICD-10-CM | POA: Insufficient documentation

## 2015-05-30 DIAGNOSIS — Z79899 Other long term (current) drug therapy: Secondary | ICD-10-CM | POA: Diagnosis not present

## 2015-05-30 DIAGNOSIS — E119 Type 2 diabetes mellitus without complications: Secondary | ICD-10-CM | POA: Diagnosis not present

## 2015-05-30 DIAGNOSIS — G35 Multiple sclerosis: Secondary | ICD-10-CM | POA: Diagnosis not present

## 2015-05-30 DIAGNOSIS — R002 Palpitations: Secondary | ICD-10-CM | POA: Insufficient documentation

## 2015-05-30 DIAGNOSIS — F419 Anxiety disorder, unspecified: Secondary | ICD-10-CM | POA: Insufficient documentation

## 2015-05-30 LAB — URINALYSIS, ROUTINE W REFLEX MICROSCOPIC
BILIRUBIN URINE: NEGATIVE
Glucose, UA: NEGATIVE mg/dL
HGB URINE DIPSTICK: NEGATIVE
KETONES UR: NEGATIVE mg/dL
Leukocytes, UA: NEGATIVE
NITRITE: NEGATIVE
Protein, ur: NEGATIVE mg/dL
Specific Gravity, Urine: 1.01 (ref 1.005–1.030)
Urobilinogen, UA: 0.2 mg/dL (ref 0.0–1.0)
pH: 6.5 (ref 5.0–8.0)

## 2015-05-30 LAB — I-STAT CHEM 8, ED
BUN: 8 mg/dL (ref 6–20)
CALCIUM ION: 1.26 mmol/L — AB (ref 1.12–1.23)
Chloride: 102 mmol/L (ref 101–111)
Creatinine, Ser: 0.7 mg/dL (ref 0.44–1.00)
Glucose, Bld: 88 mg/dL (ref 65–99)
HCT: 34 % — ABNORMAL LOW (ref 36.0–46.0)
HEMOGLOBIN: 11.6 g/dL — AB (ref 12.0–15.0)
Potassium: 4.2 mmol/L (ref 3.5–5.1)
SODIUM: 139 mmol/L (ref 135–145)
TCO2: 25 mmol/L (ref 0–100)

## 2015-05-30 NOTE — ED Notes (Addendum)
Patient states "I just don't feel well. I've been having fluttering in my chest that started this evening." Denies pain. Patient also complaining of "some shortness of breath" that started same time as palpitations.

## 2015-05-30 NOTE — ED Notes (Signed)
Pt states feels like heart is fluttering, having pain in her feet also.

## 2015-05-30 NOTE — Discharge Instructions (Signed)
If you were given medicines take as directed.  If you are on coumadin or contraceptives realize their levels and effectiveness is altered by many different medicines.  If you have any reaction (rash, tongues swelling, other) to the medicines stop taking and see a physician.    If your blood pressure was elevated in the ER make sure you follow up for management with a primary doctor or return for chest pain, shortness of breath or stroke symptoms.  Please follow up as directed and return to the ER or see a physician for new or worsening symptoms.  Thank you. Filed Vitals:   05/30/15 2001 05/30/15 2130  BP: 142/86   Pulse: 91 74  Temp: 98 F (36.7 C)   TempSrc: Oral   Resp: 20 20  Height: 5\' 4"  (1.626 m)   Weight: 193 lb (87.544 kg)   SpO2: 100% 99%

## 2015-05-30 NOTE — ED Notes (Signed)
Pt alert & oriented x4, stable gait. Patient given discharge instructions, paperwork & prescription(s). Patient  instructed to stop at the registration desk to finish any additional paperwork. Patient verbalized understanding. Pt left department w/ no further questions. 

## 2015-05-30 NOTE — ED Provider Notes (Signed)
CSN: [id number]     Arrival date & time 05/30/15  1947 History  By signing my name below, I, Arianna Nassar, attest that this documentation has been prepared under the direction and in the presence of Elnora Morrison, MD. Electronically Signed: Julien Nordmann, ED Scribe. 05/30/2015. 9:47 PM.    Chief Complaint  Patient presents with  . Palpitations      The history is provided by the patient. No language interpreter was used.   HPI Comments: Dawn Barnes is a 49 y.o. female who has a hx of thyroid disease, GERD, MS, and DM presents to the Emergency Department complaining of constant, gradual worsening palpitations onset this evening. Pt has been having associated abdominal pain with gas. Pt began taking her new medication Nuvigil last week. She reports that her palpitations occur with movement. Pt denies abdominal surgeries, hematemesis, hematuria, and dysuria.  Past Medical History  Diagnosis Date  . Depression   . Anxiety   . Thyroid disease   . Heartburn   . Mixed incontinence   . Constipation 04/30/2013  . MS (multiple sclerosis) ([place])     comes and goes  . Diabetes mellitus without complication (HCC)     borderline  . Urge urinary incontinence 05/10/2015   Past Surgical History  Procedure Laterality Date  . Incontinence surgery    . Tubal ligation    . Endometrial ablation     Family History  Problem Relation Age of Onset  . Diabetes Father   . Heart disease Father   . Hypertension Paternal Aunt   . Heart disease Paternal Aunt   . Seizures Paternal Aunt   . Heart disease Paternal Grandmother   . Depression Daughter   . Hypertension Sister   . Heart disease Sister   . Depression Son   . ADD / ADHD Son   . Depression Daughter    Social History  Substance Use Topics  . Smoking status: Never Smoker   . Smokeless tobacco: Never Used  . Alcohol Use: No   OB History    Gravida Para Term Preterm AB TAB SAB Ectopic Multiple Living   3 3        3      Review of  Systems  Cardiovascular: Positive for palpitations. Negative for chest pain.  Gastrointestinal: Positive for abdominal pain. Negative for vomiting.  Genitourinary: Negative for dysuria and hematuria.  All other systems reviewed and are negative.     Allergies  Tuberculin tests  Home Medications   Prior to Admission medications   Medication Sig Start Date End Date Taking? Authorizing Provider  Armodafinil (NUVIGIL) 150 MG tablet Take 150 mg by mouth daily.   Yes Historical Provider, MD  cetirizine (ZYRTEC) 10 MG tablet Take 10 mg by mouth daily.   Yes Historical Provider, MD  escitalopram (LEXAPRO) 20 MG tablet Take 20 mg by mouth daily.   Yes Historical Provider, MD  fesoterodine (TOVIAZ) 4 MG TB24 tablet Take 4 mg by mouth daily.   Yes Historical Provider, MD  ibuprofen (ADVIL,MOTRIN) 800 MG tablet Take 800 mg by mouth every 8 (eight) hours as needed for mild pain or moderate pain.    Yes Historical Provider, MD  Interferon Beta-1a (REBIF REBIDOSE) 44 MCG/0.5ML SOAJ Inject into the skin every Monday, Wednesday, and Friday. Before bedtime   Yes Historical Provider, MD  levothyroxine (SYNTHROID, LEVOTHROID) 125 MCG tablet Take 125 mcg by mouth daily before breakfast.   Yes Historical Provider, MD  naproxen (NAPROSYN) 500 MG tablet  Take 500 mg by mouth 2 (two) times daily with a meal.   Yes Historical Provider, MD  omeprazole (PRILOSEC) 20 MG capsule Take 40 mg by mouth daily.    Yes Historical Provider, MD  acetaminophen-codeine (TYLENOL #3) 300-30 MG per tablet Take 1 tablet by mouth every 6 (six) hours as needed for moderate pain.    Historical Provider, MD  penicillin v potassium (VEETID) 500 MG tablet Take 500 mg by mouth 4 (four) times daily.    Historical Provider, MD   Triage vitals: BP 142/86 mmHg  Pulse 91  Temp(Src) 98 F (36.7 C) (Oral)  Resp 20  Ht 5\' 4"  (1.626 m)  Wt 193 lb (87.544 kg)  BMI 33.11 kg/m2  SpO2 100% Physical Exam  Constitutional: She is oriented to  person, place, and time. She appears well-developed and well-nourished. No distress.  HENT:  Head: Normocephalic and atraumatic.  Eyes: EOM are normal.  Neck: Normal range of motion.  Cardiovascular: Normal rate, regular rhythm and normal heart sounds.   No murmur heard. Pulmonary/Chest: Effort normal and breath sounds normal.  Lungs are clear  Abdominal: Soft. She exhibits no distension. There is no tenderness.  No focal tenderness  Musculoskeletal: Normal range of motion.  Neurological: She is alert and oriented to person, place, and time.  Skin: Skin is warm and dry.  Psychiatric: She has a normal mood and affect. Judgment normal.  Nursing note and vitals reviewed.   ED Course  Procedures  DIAGNOSTIC STUDIES: Oxygen Saturation is 100% on RA, normal by my interpretation.  COORDINATION OF CARE:  9:44 PM Discussed treatment plan which includes lab work, follow up with PCP with pt at bedside and pt agreed to plan.  Labs Review Labs Reviewed  I-STAT CHEM 8, ED - Abnormal; Notable for the following:    Calcium, Ion 1.26 (*)    Hemoglobin 11.6 (*)    HCT 34.0 (*)    All other components within normal limits  URINALYSIS, ROUTINE W REFLEX MICROSCOPIC (NOT AT [facility])    Imaging Review No results found. I have personally reviewed and evaluated these images and lab results as part of my medical decision-making.   EKG Interpretation   Date/Time:  Sunday May 30 2015 20:03:31 EDT Ventricular Rate:  82 PR Interval:  142 QRS Duration: 72 QT Interval:  374 QTC Calculation: 436 R Axis:   34 Text Interpretation:  Normal sinus rhythm Normal ECG Confirmed by Sherma Vanmetre   MD, Jonathon Castelo ([id number]) on 05/30/2015 8:50:46 PM      MDM   Final diagnoses:  Palpitations   Patient presents with primarily palpitations. EKG unremarkable, vitals unremarkable. Possibly side effect from new medication reviewed medication side effects with patient. Patient will follow-up with primary doctor for  reassessment. Patient taking thyroid medications.  Labs reviewed.  Results and differential diagnosis were discussed with the patient/parent/guardian. Xrays were independently reviewed by myself.  Close follow up outpatient was discussed, comfortable with the plan.   Medications - No data to display  Filed Vitals:   05/30/15 2001 05/30/15 2130  BP: 142/86   Pulse: 91 74  Temp: 98 F (36.7 C)   TempSrc: Oral   Resp: 20 20  Height: 5\' 4"  (1.626 m)   Weight: 193 lb (87.544 kg)   SpO2: 100% 99%    Final diagnoses:  Palpitations      Elnora Morrison, MD 05/30/15 2244

## 2015-10-12 ENCOUNTER — Encounter: Payer: Self-pay | Admitting: Gastroenterology

## 2015-10-21 ENCOUNTER — Ambulatory Visit (INDEPENDENT_AMBULATORY_CARE_PROVIDER_SITE_OTHER): Payer: Medicaid Other | Admitting: Orthopaedic Surgery

## 2015-10-21 VITALS — BP 117/68 | HR 71 | Temp 97.9°F | Ht 64.0 in | Wt 204.0 lb

## 2015-10-21 DIAGNOSIS — M25562 Pain in left knee: Secondary | ICD-10-CM

## 2015-10-22 NOTE — Progress Notes (Signed)
Patient [id number] Dawn Barnes, female DOB:01/04/1966, 50 y.o. [id number]  Chief Complaint  Patient presents with  . Follow-up    Bilateral knee pain    HPI  Dawn Barnes is a 50 y.o. female who has chronic left knee pain.  She has no new trauma.  She has no giving way.  She has no locking.  She has swelling and popping.  She has more pain with activity and cold weather.  She is taking her medicine which helps.   Knee Pain  The pain is present in the left knee. The quality of the pain is described as aching. The pain is at a severity of 3/10. The pain is mild. The pain has been fluctuating since onset. Associated symptoms include a loss of motion. Pertinent negatives include no numbness or tingling. She has tried ice, heat, immobilization, acetaminophen, non-weight bearing, NSAIDs and rest for the symptoms. The treatment provided moderate relief.    Body mass index is 35 kg/(m^2).  Review of Systems  Patient does not have Diabetes Mellitus. Patient does not have hypertension. Patient does not have COPD or shortness of breath. Patient does not have BMI > 35. Patient does not have current smoking history.  Review of Systems  HENT: Negative for congestion.   Respiratory: Negative for cough and shortness of breath.   Cardiovascular: Negative for chest pain.  Endocrine: Positive for cold intolerance.  Musculoskeletal: Positive for myalgias, joint swelling, arthralgias and gait problem.  Allergic/Immunologic: Positive for environmental allergies.  Neurological: Negative for tingling and numbness.    Past Medical History  Diagnosis Date  . Depression   . Anxiety   . Thyroid disease   . Heartburn   . Mixed incontinence   . Constipation 04/30/2013  . MS (multiple sclerosis) ([place])     comes and goes  . Diabetes mellitus without complication (HCC)     borderline  . Urge urinary incontinence 05/10/2015    Past Surgical History  Procedure Laterality Date  . Incontinence surgery     . Tubal ligation    . Endometrial ablation      Family History  Problem Relation Age of Onset  . Diabetes Father   . Heart disease Father   . Hypertension Paternal Aunt   . Heart disease Paternal Aunt   . Seizures Paternal Aunt   . Heart disease Paternal Grandmother   . Depression Daughter   . Hypertension Sister   . Heart disease Sister   . Depression Son   . ADD / ADHD Son   . Depression Daughter     Social History Social History  Substance Use Topics  . Smoking status: Never Smoker   . Smokeless tobacco: Never Used  . Alcohol Use: No    Allergies  Allergen Reactions  . Tuberculin Tests Dermatitis    Current Outpatient Prescriptions  Medication Sig Dispense Refill  . acetaminophen-codeine (TYLENOL #3) 300-30 MG per tablet Take 1 tablet by mouth every 6 (six) hours as needed for moderate pain.    . Armodafinil (NUVIGIL) 150 MG tablet Take 150 mg by mouth daily.    . cetirizine (ZYRTEC) 10 MG tablet Take 10 mg by mouth daily.    Marland Kitchen escitalopram (LEXAPRO) 20 MG tablet Take 20 mg by mouth daily.    . fesoterodine (TOVIAZ) 4 MG TB24 tablet Take 4 mg by mouth daily.    Marland Kitchen ibuprofen (ADVIL,MOTRIN) 800 MG tablet Take 800 mg by mouth every 8 (eight) hours as needed for mild pain or  moderate pain.     . Interferon Beta-1a (REBIF REBIDOSE) 44 MCG/0.5ML SOAJ Inject into the skin every Monday, Wednesday, and Friday. Before bedtime    . levothyroxine (SYNTHROID, LEVOTHROID) 125 MCG tablet Take 125 mcg by mouth daily before breakfast.    . naproxen (NAPROSYN) 500 MG tablet Take 500 mg by mouth 2 (two) times daily with a meal.    . omeprazole (PRILOSEC) 20 MG capsule Take 40 mg by mouth daily.     . penicillin v potassium (VEETID) 500 MG tablet Take 500 mg by mouth 4 (four) times daily.     No current facility-administered medications for this visit.     Physical Exam  Blood pressure 117/68, pulse 71, temperature 97.9 F (36.6 C), height 5\' 4"  (1.626 m), weight 204 lb (92.534  kg).  Constitutional: overall normal hygiene, normal nutrition, well developed, normal grooming, normal body habitus. Assistive device:none  Musculoskeletal: gait and station Limp left, muscle tone and strength are normal, no tremors or atrophy is present.  .  Neurological: coordination overall normal.  Deep tendon reflex/nerve stretch intact.  Sensation normal.  Cranial nerves II-XII intact.   Skin:   normal overall no scars, lesions, ulcers or rashes. No psoriasis.  Psychiatric: Alert and oriented x 3.  Recent memory intact, remote memory unclear.  Normal mood and affect. Well groomed.  Good eye contact.  Cardiovascular: overall no swelling, no varicosities, no edema bilaterally, normal temperatures of the legs and arms, no clubbing, cyanosis and good capillary refill.  Lymphatic: palpation is normal.  The left lower extremity is examined:  Inspection:  Thigh:  Non-tender and no defects  Knee has swelling 1+ effusion.                        Joint tenderness is present                        Patient is tender over the medial joint line  Lower Leg:  Has normal appearance and no tenderness or defects  Ankle:  Non-tender and no defects  Foot:  Non-tender and no defects Range of Motion:  Knee:  Range of motion is: 0-105                        Crepitus is  present  Ankle:  Range of motion is normal. Strength and Tone:  The left lower extremity has normal strength and tone. Stability:  Knee:  The knee is stable.  Ankle:  The ankle is stable.   The patient has been educated about the nature of the problem(s) and counseled on treatment options.  The patient appeared to understand what I have discussed and is in agreement with it.  Encounter Diagnosis  Name Primary?  . Left knee pain Yes    PLAN Call if any problems.  Precautions discussed.  Continue current medications.   Return to clinic 3 months

## 2015-10-25 ENCOUNTER — Ambulatory Visit (INDEPENDENT_AMBULATORY_CARE_PROVIDER_SITE_OTHER): Payer: Medicaid Other | Admitting: "Endocrinology

## 2015-10-25 ENCOUNTER — Encounter: Payer: Self-pay | Admitting: "Endocrinology

## 2015-10-25 VITALS — BP 134/87 | HR 74 | Ht 64.0 in | Wt 205.0 lb

## 2015-10-25 DIAGNOSIS — R7303 Prediabetes: Secondary | ICD-10-CM | POA: Insufficient documentation

## 2015-10-25 DIAGNOSIS — E669 Obesity, unspecified: Secondary | ICD-10-CM | POA: Insufficient documentation

## 2015-10-25 DIAGNOSIS — E039 Hypothyroidism, unspecified: Secondary | ICD-10-CM | POA: Diagnosis not present

## 2015-10-25 DIAGNOSIS — E559 Vitamin D deficiency, unspecified: Secondary | ICD-10-CM | POA: Diagnosis not present

## 2015-10-25 MED ORDER — LEVOTHYROXINE SODIUM 137 MCG PO TABS: 125.0000 ug | ORAL_TABLET | Freq: Every day | ORAL | Status: DC

## 2015-10-25 MED ORDER — VITAMIN D (ERGOCALCIFEROL) 1.25 MG (50000 UNIT) PO CAPS: 50000.0000 [IU] | ORAL_CAPSULE | ORAL | Status: DC

## 2015-10-25 NOTE — Progress Notes (Signed)
Subjective:    Patient ID: Dawn Barnes, female    DOB: May 03, 1966, PCP Rosita Fire, MD   Past Medical History  Diagnosis Date  . Depression   . Anxiety   . Thyroid disease   . Heartburn   . Mixed incontinence   . Constipation 04/30/2013  . MS (multiple sclerosis) ([place])     comes and goes  . Diabetes mellitus without complication (HCC)     borderline  . Urge urinary incontinence 05/10/2015   Past Surgical History  Procedure Laterality Date  . Incontinence surgery    . Tubal ligation    . Endometrial ablation     Social History   Social History  . Marital Status: Legally Separated    Spouse Name: N/A  . Number of Children: N/A  . Years of Education: N/A   Social History Main Topics  . Smoking status: Never Smoker   . Smokeless tobacco: Never Used  . Alcohol Use: No  . Drug Use: No  . Sexual Activity: No     Comment: tubal   Other Topics Concern  . None   Social History Narrative   Outpatient Encounter Prescriptions as of 10/25/2015  Medication Sig  . acetaminophen-codeine (TYLENOL #3) 300-30 MG per tablet Take 1 tablet by mouth every 6 (six) hours as needed for moderate pain.  . cetirizine (ZYRTEC) 10 MG tablet Take 10 mg by mouth daily.  Marland Kitchen escitalopram (LEXAPRO) 20 MG tablet Take 20 mg by mouth daily.  . fesoterodine (TOVIAZ) 4 MG TB24 tablet Take 8 mg by mouth daily.   Marland Kitchen ibuprofen (ADVIL,MOTRIN) 800 MG tablet Take 800 mg by mouth every 8 (eight) hours as needed for mild pain or moderate pain.   Marland Kitchen levothyroxine (SYNTHROID, LEVOTHROID) 137 MCG tablet Take 1 tablet (137 mcg total) by mouth daily before breakfast.  . naproxen (NAPROSYN) 500 MG tablet Take 500 mg by mouth 2 (two) times daily with a meal.  . omeprazole (PRILOSEC) 20 MG capsule Take 40 mg by mouth daily.   . penicillin v potassium (VEETID) 500 MG tablet Take 500 mg by mouth 4 (four) times daily.  . Vitamin D, Ergocalciferol, (DRISDOL) 50000 units CAPS capsule Take 1 capsule (50,000 Units  total) by mouth every 7 (seven) days.  . [DISCONTINUED] Armodafinil (NUVIGIL) 150 MG tablet Take 150 mg by mouth daily.  . [DISCONTINUED] Interferon Beta-1a (REBIF REBIDOSE) 44 MCG/0.5ML SOAJ Inject into the skin every Monday, Wednesday, and Friday. Before bedtime  . [DISCONTINUED] levothyroxine (SYNTHROID, LEVOTHROID) 125 MCG tablet Take 125 mcg by mouth daily before breakfast.   No facility-administered encounter medications on file as of 10/25/2015.   ALLERGIES: Allergies  Allergen Reactions  . Tuberculin Tests Dermatitis   VACCINATION STATUS: Immunization History  Administered Date(s) Administered  . Influenza-Unspecified 04/30/2014, 03/15/2015    HPI  50 year old female patient with medical history of hypothyroidism from Hashimoto's thyroiditis. She is on levothyroxine 125 g by mouth every morning. She has reasonable compliance. She has gained a few pounds since last visit. She complains of cold intolerance, fatigue, and dry skin. She also has history of prediabetes on exercise and diet.   Review of Systems Constitutional: +weight gain, + fatigue, no subjective hyperthermia/hypothermia Eyes: no blurry vision, no xerophthalmia ENT: no sore throat, no nodules palpated in throat, no dysphagia/odynophagia, no hoarseness Cardiovascular: no CP/SOB/palpitations/leg swelling Respiratory: no cough/SOB Gastrointestinal: no N/V/D/C Musculoskeletal: no muscle/joint aches Skin: no rashes Neurological: no tremors/numbness/tingling/dizziness Psychiatric: no depression/anxiety  Objective:    BP 134/87  mmHg  Pulse 74  Ht '5\' 4"'  (1.626 m)  Wt 205 lb (92.987 kg)  BMI 35.17 kg/m2  SpO2 97%  Wt Readings from Last 3 Encounters:  10/25/15 205 lb (92.987 kg)  10/21/15 204 lb (92.534 kg)  05/30/15 193 lb (87.544 kg)    Physical Exam Constitutional: overweight, in NAD Eyes: PERRLA, EOMI, no exophthalmos ENT: moist mucous membranes, no thyromegaly, no cervical  lymphadenopathy Cardiovascular: RRR, No MRG Respiratory: CTA B Gastrointestinal: abdomen soft, NT, ND, BS+ Musculoskeletal: no deformities, strength intact in all 4 Skin: moist, warm, no rashes Neurological: no tremor with outstretched hands, DTR normal in all 4   CMP     Component Value Date/Time   NA 139 05/30/2015 2203   K 4.2 05/30/2015 2203   CL 102 05/30/2015 2203   CO2 26 03/03/2010 2015   GLUCOSE 88 05/30/2015 2203   BUN 8 05/30/2015 2203   CREATININE 0.70 05/30/2015 2203   CALCIUM 8.8 03/03/2010 2015   GFRNONAA >60 03/03/2010 2015   GFRAA  03/03/2010 2015    >60        The eGFR has been calculated using the MDRD equation. This calculation has not been validated in all clinical situations. eGFR's persistently <60 mL/min signify possible Chronic Kidney Disease.   Heart thyroid function tests are consistent with underage replacement.     Assessment & Plan:   1. Hypothyroidism, unspecified hypothyroidism type -I'll proceed to increase her levothyroxine to 137 g by mouth every morning. We discussed the correct way of thyroid hormone intake. She understands the need for lifelong thyroid hormone replacement.  2. Pre-diabetes -Her repeat A1c is 5.8%. She would not need medications for now. She is encouraged to stay active and avoid simple/processed carbohydrates.  3. Vitamin D deficiency -This is a new diagnosis for her. I would initiate vitamin D replacement 50,000 units weekly for the next 12 weeks.  4. Morbid obesity due to excess calories ([place]) -She has gained weight since last visit, partly due to thyroid hormone under replacement. I have given her detailed carbohydrate and exercise regimen. - 25 minutes of time was spent on the care of this patient , 50% of which was applied for counseling on diabetes complications and their preventions.  - I advised patient to maintain close follow up with [facility], MD for primary care needs. Follow up plan: Return  in about 3 months (around 01/25/2016) for underactive thyroid, Vitamin D deficiency, prediabetes, follow up with pre-visit labs.  Glade Lloyd, MD Phone: [phone]  Fax: [phone]   10/25/2015, 3:57 PM

## 2015-10-27 ENCOUNTER — Ambulatory Visit: Payer: Medicaid Other | Admitting: Gastroenterology

## 2015-11-03 ENCOUNTER — Encounter: Payer: Self-pay | Admitting: Gastroenterology

## 2015-11-03 ENCOUNTER — Ambulatory Visit (INDEPENDENT_AMBULATORY_CARE_PROVIDER_SITE_OTHER): Payer: Medicaid Other | Admitting: Gastroenterology

## 2015-11-03 ENCOUNTER — Other Ambulatory Visit: Payer: Self-pay

## 2015-11-03 VITALS — BP 132/90 | HR 65 | Temp 98.3°F | Ht 64.0 in | Wt 203.2 lb

## 2015-11-03 DIAGNOSIS — K625 Hemorrhage of anus and rectum: Secondary | ICD-10-CM | POA: Diagnosis not present

## 2015-11-03 DIAGNOSIS — R1310 Dysphagia, unspecified: Secondary | ICD-10-CM | POA: Insufficient documentation

## 2015-11-03 MED ORDER — NA SULFATE-K SULFATE-MG SULF 17.5-3.13-1.6 GM/177ML PO SOLN
1.0000 | Freq: Once | ORAL | Status: AC
Start: 2015-11-03 — End: 2015-12-03

## 2015-11-03 NOTE — Patient Instructions (Addendum)
We have scheduled you for a colonoscopy, upper endoscopy and dilation with Dr. Oneida Alar.   Further recommendations to follow.

## 2015-11-03 NOTE — Assessment & Plan Note (Signed)
50 year old female with low-volume hematochezia recently, now resolved, without any family history of colon cancer or concerning lower GI symptoms. No prior colonoscopy. Likely benign anorectal source. Will proceed with colonoscopy for direct visualization.   Proceed with colonoscopy with Dr. Oneida Alar in the near future. The risks, benefits, and alternatives have been discussed in detail with the patient. They state understanding and desire to proceed.  PROPOFOL due to polypharmacy

## 2015-11-03 NOTE — Progress Notes (Signed)
Primary Care Physician:  Rosita Fire, MD  Referring Physician: Dr. Merlene Laughter  Primary Gastroenterologist:  Dr. Oneida Alar   Chief Complaint  Patient presents with  . Blood In Stools    HPI:   Dawn Barnes is a 50 y.o. female presenting today at the request of Dr. Merlene Laughter secondary to rectal bleeding. States she saw some bright red blood per rectum a few weeks ago and got worried. Denies straining. Taking stool softener and has no constipation. No abdominal pain. Has had some lower back discomfort around time of rectal bleeding. Has bladder trouble and feels bloated at times. Lately has noted some esophageal dysphagia. States she only has about 9 teeth and has to be careful when she eats.  Prilosec 40 mg each day.   Past Medical History  Diagnosis Date  . Depression   . Anxiety   . Thyroid disease   . Heartburn   . Mixed incontinence   . Constipation 04/30/2013  . MS (multiple sclerosis) ([place])   . Diabetes mellitus without complication (HCC)     borderline  . Urge urinary incontinence 05/10/2015  . GERD (gastroesophageal reflux disease)   . PTSD (post-traumatic stress disorder)     Past Surgical History  Procedure Laterality Date  . Incontinence surgery    . Tubal ligation    . Endometrial ablation      Current Outpatient Prescriptions  Medication Sig Dispense Refill  . acetaminophen-codeine (TYLENOL #3) 300-30 MG per tablet Take 1 tablet by mouth every 6 (six) hours as needed for moderate pain.    . cetirizine (ZYRTEC) 10 MG tablet Take 10 mg by mouth daily.    . Dimethyl Fumarate (TECFIDERA) 240 MG CPDR Take 240 mg by mouth 2 (two) times daily.    Marland Kitchen escitalopram (LEXAPRO) 20 MG tablet Take 20 mg by mouth daily.    . fesoterodine (TOVIAZ) 4 MG TB24 tablet Take 8 mg by mouth daily.     Marland Kitchen ibuprofen (ADVIL,MOTRIN) 800 MG tablet Take 800 mg by mouth every 8 (eight) hours as needed for mild pain or moderate pain.     Marland Kitchen levothyroxine (SYNTHROID, LEVOTHROID) 137 MCG tablet Take  1 tablet (137 mcg total) by mouth daily before breakfast. 30 tablet 3  . naproxen (NAPROSYN) 500 MG tablet Take 500 mg by mouth 2 (two) times daily with a meal.    . omeprazole (PRILOSEC) 20 MG capsule Take 40 mg by mouth daily.     . penicillin v potassium (VEETID) 500 MG tablet Take 500 mg by mouth 4 (four) times daily.    . Vitamin D, Ergocalciferol, (DRISDOL) 50000 units CAPS capsule Take 1 capsule (50,000 Units total) by mouth every 7 (seven) days. 12 capsule 0   No current facility-administered medications for this visit.    Allergies as of 11/03/2015 - Review Complete 10/25/2015  Allergen Reaction Noted  . Tuberculin tests Dermatitis 01/18/2015    Family History  Problem Relation Age of Onset  . Diabetes Father   . Heart disease Father   . Hypertension Paternal Aunt   . Heart disease Paternal Aunt   . Seizures Paternal Aunt   . Heart disease Paternal Grandmother   . Depression Daughter   . Hypertension Sister   . Heart disease Sister   . Depression Son   . ADD / ADHD Son   . Depression Daughter   . Colon cancer Neg Hx     Social History   Social History  . Marital Status: Legally Separated  Spouse Name: N/A  . Number of Children: N/A  . Years of Education: N/A   Occupational History  . disability    Social History Main Topics  . Smoking status: Never Smoker   . Smokeless tobacco: Never Used  . Alcohol Use: No  . Drug Use: No  . Sexual Activity: No     Comment: tubal   Other Topics Concern  . Not on file   Social History Narrative    Review of Systems: Gen: Denies any fever, chills, fatigue, weight loss, lack of appetite.  CV: Denies chest pain, heart palpitations, peripheral edema, syncope.  Resp: Denies shortness of breath at rest or with exertion. Denies wheezing or cough.  GI: see HPI  GU : +urinary frequency  MS: +joint pain, muscle weakness  Derm: Denies rash, itching, dry skin Psych: +depression Heme: see HPI .  Physical Exam: BP  132/90 mmHg  Pulse 65  Temp(Src) 98.3 F (36.8 C) (Oral)  Ht 5\' 4"  (1.626 m)  Wt 203 lb 3.2 oz (92.171 kg)  BMI 34.86 kg/m2 General:   Alert and oriented. Pleasant and cooperative. Well-nourished and well-developed.  Head:  Normocephalic and atraumatic. Eyes:  Without icterus, sclera clear and conjunctiva pink.  Ears:  Normal auditory acuity. Nose:  No deformity, discharge,  or lesions. Mouth:  No deformity or lesions, oral mucosa pink.  Lungs:  Clear to auscultation bilaterally. No wheezes, rales, or rhonchi. No distress.  Heart:  S1, S2 present without murmurs appreciated.  Abdomen:  +BS, soft, non-tender and non-distended. No HSM noted. No guarding or rebound. No masses appreciated.  Rectal:  Deferred  Msk:  Symmetrical without gross deformities. Normal posture. Extremities:  Without edema. Neurologic:  Alert and  oriented x4;  grossly normal neurologically. Psych:  Alert and cooperative. Normal mood and affect.

## 2015-11-03 NOTE — Assessment & Plan Note (Signed)
Vague esophageal dysphagia in setting of chronic GERD. Limited dentition may be contributing. Unable to rule out web, ring, stricture.   Proceed with upper endoscopy./dilation in the near future with Dr. Oneida Alar. The risks, benefits, and alternatives have been discussed in detail with patient. They have stated understanding and desire to proceed.  PROPOFOL due to polypharmacy

## 2015-11-04 NOTE — Progress Notes (Signed)
CC'ED TO REFERRING °

## 2015-11-08 ENCOUNTER — Encounter: Payer: Self-pay | Admitting: "Endocrinology

## 2015-11-22 ENCOUNTER — Other Ambulatory Visit: Payer: Self-pay | Admitting: "Endocrinology

## 2015-11-30 NOTE — Patient Instructions (Signed)
Dawn Barnes  11/30/2015     @PREFPERIOPPHARMACY @   Your procedure is scheduled on   12/07/2015   Report to [facility] at  12  A.M.  Call this number if you have problems the morning of surgery:  [phone]   Remember:  Do not eat food or drink liquids after midnight.  Take these medicines the morning of surgery with A SIP OF WATER  Wellbutrin, zyrtec, tecfidera, lexapro, toviaz, levothyroxine, prilosec.   Do not wear jewelry, make-up or nail polish.  Do not wear lotions, powders, or perfumes.  You may wear deodorant.  Do not shave 48 hours prior to surgery.  Men may shave face and neck.  Do not bring valuables to the hospital.  [facility] is not responsible for any belongings or valuables.  Contacts, dentures or bridgework may not be worn into surgery.  Leave your suitcase in the car.  After surgery it may be brought to your room.  For patients admitted to the hospital, discharge time will be determined by your treatment team.  Patients discharged the day of surgery will not be allowed to drive home.   Name and phone number of your driver:   family Special instructions:  Follow the diet and prep instructions given to you by Dr Nona Dell office.  Please read over the following fact sheets that you were given. Coughing and Deep Breathing, Surgical Site Infection Prevention, Anesthesia Post-op Instructions and Care and Recovery After Surgery      Esophagogastroduodenoscopy Esophagogastroduodenoscopy (EGD) is a procedure that is used to examine the lining of the esophagus, stomach, and first part of the small intestine (duodenum). A long, flexible, lighted tube with a camera attached (endoscope) is inserted down the throat to view these organs. This procedure is done to detect problems or abnormalities, such as inflammation, bleeding, ulcers, or growths, in order to treat them. The procedure lasts 5-20 minutes. It is usually an outpatient procedure, but it  may need to be performed in a hospital in emergency cases. LET [facility] CARE PROVIDER KNOW ABOUT:  Any allergies you have.  All medicines you are taking, including vitamins, herbs, eye drops, creams, and over-the-counter medicines.  Previous problems you or members of your family have had with the use of anesthetics.  Any blood disorders you have.  Previous surgeries you have had.  Medical conditions you have. RISKS AND COMPLICATIONS Generally, this is a safe procedure. However, problems can occur and include:  Infection.  Bleeding.  Tearing (perforation) of the esophagus, stomach, or duodenum.  Difficulty breathing or not being able to breathe.  Excessive sweating.  Spasms of the larynx.  Slowed heartbeat.  Low blood pressure. BEFORE THE PROCEDURE  Do not eat or drink anything after midnight on the night before the procedure or as directed by your health care provider.  Do not take your regular medicines before the procedure if your health care provider asks you not to. Ask your health care provider about changing or stopping those medicines.  If you wear dentures, be prepared to remove them before the procedure.  Arrange for someone to drive you home after the procedure. PROCEDURE  A numbing medicine (local anesthetic) may be sprayed in your throat for comfort and to stop you from gagging or coughing.  You will have an IV tube inserted in a vein in your hand or arm. You will receive medicines and fluids through this  tube.  You will be given a medicine to relax you (sedative).  A pain reliever will be given through the IV tube.  A mouth guard may be placed in your mouth to protect your teeth and to keep you from biting on the endoscope.  You will be asked to lie on your left side.  The endoscope will be inserted down your throat and into your esophagus, stomach, and duodenum.  Air will be put through the endoscope to allow your health care provider to  clearly view the lining of your esophagus.  The lining of your esophagus, stomach, and duodenum will be examined. During the exam, your health care provider may:  Remove tissue to be examined under a microscope (biopsy) for inflammation, infection, or other medical problems.  Remove growths.  Remove objects (foreign bodies) that are stuck.  Treat any bleeding with medicines or other devices that stop tissues from bleeding (hot cautery, clipping devices).  Widen (dilate) or stretch narrowed areas of your esophagus and stomach.  The endoscope will be withdrawn. AFTER THE PROCEDURE  You will be taken to a recovery area for observation. Your blood pressure, heart rate, breathing rate, and blood oxygen level will be monitored often until the medicines you were given have worn off.  Do not eat or drink anything until the numbing medicine has worn off and your gag reflex has returned. You may choke.  Your health care provider should be able to discuss his or her findings with you. It will take longer to discuss the test results if any biopsies were taken.   This information is not intended to replace advice given to you by your health care provider. Make sure you discuss any questions you have with your health care provider.   Document Released: 11/17/2004 Document Revised: 08/07/2014 Document Reviewed: 06/19/2012 Elsevier Interactive Patient Education 2016 [employer]. Esophagogastroduodenoscopy, Care After Refer to this sheet in the next few weeks. These instructions provide you with information about caring for yourself after your procedure. Your health care provider may also give you more specific instructions. Your treatment has been planned according to current medical practices, but problems sometimes occur. Call your health care provider if you have any problems or questions after your procedure. WHAT TO EXPECT AFTER THE PROCEDURE After your procedure, it is typical to  feel:  Soreness in your throat.  Pain with swallowing.  Sick to your stomach (nauseous).  Bloated.  Dizzy.  Fatigued. HOME CARE INSTRUCTIONS  Do not eat or drink anything until the numbing medicine (local anesthetic) has worn off and your gag reflex has returned. You will know that the local anesthetic has worn off when you can swallow comfortably.  Do not drive or operate machinery until directed by your health care provider.  Take medicines only as directed by your health care provider. SEEK MEDICAL CARE IF:   You cannot stop coughing.  You are not urinating at all or less than usual. SEEK IMMEDIATE MEDICAL CARE IF:  You have difficulty swallowing.  You cannot eat or drink.  You have worsening throat or chest pain.  You have dizziness or lightheadedness or you faint.  You have nausea or vomiting.  You have chills.  You have a fever.  You have severe abdominal pain.  You have black, tarry, or bloody stools.   This information is not intended to replace advice given to you by your health care provider. Make sure you discuss any questions you have with your health care provider.  Document Released: 07/03/2012 Document Revised: 08/07/2014 Document Reviewed: 07/03/2012 Elsevier Interactive Patient Education 2016 [employer] Esophageal Dilatation Esophageal dilatation is a procedure to open a blocked or narrowed part of the esophagus. The esophagus is the long tube in your throat that carries food and liquid from your mouth to your stomach. The procedure is also called esophageal dilation.  You may need this procedure if you have a buildup of scar tissue in your esophagus that makes it difficult, painful, or even impossible to swallow. This can be caused by gastroesophageal reflux disease (GERD). In rare cases, people need this procedure because they have cancer of the esophagus or a problem with the way food moves through the esophagus. Sometimes you may need to  have another dilatation to enlarge the opening of the esophagus gradually. LET [facility] CARE PROVIDER KNOW ABOUT:   Any allergies you have.  All medicines you are taking, including vitamins, herbs, eye drops, creams, and over-the-counter medicines.  Previous problems you or members of your family have had with the use of anesthetics.  Any blood disorders you have.  Previous surgeries you have had.  Medical conditions you have.  Any antibiotic medicines you are required to take before dental procedures. RISKS AND COMPLICATIONS Generally, this is a safe procedure. However, problems can occur and include:  Bleeding from a tear in the lining of the esophagus.  A hole (perforation) in the esophagus. BEFORE THE PROCEDURE  Do not eat or drink anything after midnight on the night before the procedure or as directed by your health care provider.  Ask your health care provider about changing or stopping your regular medicines. This is especially important if you are taking diabetes medicines or blood thinners.  Plan to have someone take you home after the procedure. PROCEDURE   You will be given a medicine that makes you relaxed and sleepy (sedative).  A medicine may be sprayed or gargled to numb the back of the throat.  Your health care provider can use various instruments to do an esophageal dilatation. During the procedure, the instrument used will be placed in your mouth and passed down into your esophagus. Options include:  Simple dilators. This instrument is carefully placed in the esophagus to stretch it.  Guided wire bougies. In this method, a flexible tube (endoscope) is used to insert a wire into the esophagus. The dilator is passed over this wire to enlarge the esophagus. Then the wire is removed.  Balloon dilators. An endoscope with a small balloon at the end is passed down into the esophagus. Inflating the balloon gently stretches the esophagus and opens it up. AFTER  THE PROCEDURE  Your blood pressure, heart rate, breathing rate, and blood oxygen level will be monitored often until the medicines you were given have worn off.  Your throat may feel slightly sore and will probably still feel numb. This will improve slowly over time.  You will not be allowed to eat or drink until the throat numbness has resolved.  If this is a same-day procedure, you may be allowed to go home once you have been able to drink, urinate, and sit on the edge of the bed without nausea or dizziness.  If this is a same-day procedure, you should have a friend or family member with you for the next 24 hours after the procedure.   This information is not intended to replace advice given to you by your health care provider. Make sure you discuss any questions you have  with your health care provider.   Document Released: 09/07/2005 Document Revised: 08/07/2014 Document Reviewed: 11/26/2013 Elsevier Interactive Patient Education 2016 [employer]. Colonoscopy A colonoscopy is an exam to look at the entire large intestine (colon). This exam can help find problems such as tumors, polyps, inflammation, and areas of bleeding. The exam takes about 1 hour.  LET [facility] CARE PROVIDER KNOW ABOUT:   Any allergies you have.  All medicines you are taking, including vitamins, herbs, eye drops, creams, and over-the-counter medicines.  Previous problems you or members of your family have had with the use of anesthetics.  Any blood disorders you have.  Previous surgeries you have had.  Medical conditions you have. RISKS AND COMPLICATIONS  Generally, this is a safe procedure. However, as with any procedure, complications can occur. Possible complications include:  Bleeding.  Tearing or rupture of the colon wall.  Reaction to medicines given during the exam.  Infection (rare). BEFORE THE PROCEDURE   Ask your health care provider about changing or stopping your regular  medicines.  You may be prescribed an oral bowel prep. This involves drinking a large amount of medicated liquid, starting the day before your procedure. The liquid will cause you to have multiple loose stools until your stool is almost clear or light green. This cleans out your colon in preparation for the procedure.  Do not eat or drink anything else once you have started the bowel prep, unless your health care provider tells you it is safe to do so.  Arrange for someone to drive you home after the procedure. PROCEDURE   You will be given medicine to help you relax (sedative).  You will lie on your side with your knees bent.  A long, flexible tube with a light and camera on the end (colonoscope) will be inserted through the rectum and into the colon. The camera sends video back to a computer screen as it moves through the colon. The colonoscope also releases carbon dioxide gas to inflate the colon. This helps your health care provider see the area better.  During the exam, your health care provider may take a small tissue sample (biopsy) to be examined under a microscope if any abnormalities are found.  The exam is finished when the entire colon has been viewed. AFTER THE PROCEDURE   Do not drive for 24 hours after the exam.  You may have a small amount of blood in your stool.  You may pass moderate amounts of gas and have mild abdominal cramping or bloating. This is caused by the gas used to inflate your colon during the exam.  Ask when your test results will be ready and how you will get your results. Make sure you get your test results.   This information is not intended to replace advice given to you by your health care provider. Make sure you discuss any questions you have with your health care provider.   Document Released: 07/14/2000 Document Revised: 05/07/2013 Document Reviewed: 03/24/2013 Elsevier Interactive Patient Education 2016 Elsevier Inc. Colonoscopy, Care  After Refer to this sheet in the next few weeks. These instructions provide you with information on caring for yourself after your procedure. Your health care provider may also give you more specific instructions. Your treatment has been planned according to current medical practices, but problems sometimes occur. Call your health care provider if you have any problems or questions after your procedure. WHAT TO EXPECT AFTER THE PROCEDURE  After your procedure, it is typical  to have the following:  A small amount of blood in your stool.  Moderate amounts of gas and mild abdominal cramping or bloating. HOME CARE INSTRUCTIONS  Do not drive, operate machinery, or sign important documents for 24 hours.  You may shower and resume your regular physical activities, but move at a slower pace for the first 24 hours.  Take frequent rest periods for the first 24 hours.  Walk around or put a warm pack on your abdomen to help reduce abdominal cramping and bloating.  Drink enough fluids to keep your urine clear or pale yellow.  You may resume your normal diet as instructed by your health care provider. Avoid heavy or fried foods that are hard to digest.  Avoid drinking alcohol for 24 hours or as instructed by your health care provider.  Only take over-the-counter or prescription medicines as directed by your health care provider.  If a tissue sample (biopsy) was taken during your procedure:  Do not take aspirin or blood thinners for 7 days, or as instructed by your health care provider.  Do not drink alcohol for 7 days, or as instructed by your health care provider.  Eat soft foods for the first 24 hours. SEEK MEDICAL CARE IF: You have persistent spotting of blood in your stool 2-3 days after the procedure. SEEK IMMEDIATE MEDICAL CARE IF:  You have more than a small spotting of blood in your stool.  You pass large blood clots in your stool.  Your abdomen is swollen (distended).  You have  nausea or vomiting.  You have a fever.  You have increasing abdominal pain that is not relieved with medicine.   This information is not intended to replace advice given to you by your health care provider. Make sure you discuss any questions you have with your health care provider.   Document Released: 02/29/2004 Document Revised: 05/07/2013 Document Reviewed: 03/24/2013 Elsevier Interactive Patient Education 2016 Elsevier Inc. PATIENT INSTRUCTIONS POST-ANESTHESIA  IMMEDIATELY FOLLOWING SURGERY:  Do not drive or operate machinery for the first twenty four hours after surgery.  Do not make any important decisions for twenty four hours after surgery or while taking narcotic pain medications or sedatives.  If you develop intractable nausea and vomiting or a severe headache please notify your doctor immediately.  FOLLOW-UP:  Please make an appointment with your surgeon as instructed. You do not need to follow up with anesthesia unless specifically instructed to do so.  WOUND CARE INSTRUCTIONS (if applicable):  Keep a dry clean dressing on the anesthesia/puncture wound site if there is drainage.  Once the wound has quit draining you may leave it open to air.  Generally you should leave the bandage intact for twenty four hours unless there is drainage.  If the epidural site drains for more than 36-48 hours please call the anesthesia department.  QUESTIONS?:  Please feel free to call your physician or the hospital operator if you have any questions, and they will be happy to assist you.

## 2015-12-02 ENCOUNTER — Encounter (HOSPITAL_COMMUNITY): Payer: Self-pay

## 2015-12-02 ENCOUNTER — Encounter (HOSPITAL_COMMUNITY)
Admission: RE | Admit: 2015-12-02 | Discharge: 2015-12-02 | Disposition: A | Discharge status: Home / Self Care | Payer: Medicaid Other | Source: Ambulatory Visit | Attending: Gastroenterology | Admitting: Gastroenterology

## 2015-12-02 DIAGNOSIS — R1310 Dysphagia, unspecified: Secondary | ICD-10-CM | POA: Insufficient documentation

## 2015-12-02 DIAGNOSIS — Z01812 Encounter for preprocedural laboratory examination: Secondary | ICD-10-CM | POA: Diagnosis not present

## 2015-12-02 DIAGNOSIS — K625 Hemorrhage of anus and rectum: Secondary | ICD-10-CM | POA: Insufficient documentation

## 2015-12-02 HISTORY — DX: Nausea with vomiting, unspecified: R11.2

## 2015-12-02 HISTORY — DX: Other specified postprocedural states: Z98.890

## 2015-12-02 LAB — BASIC METABOLIC PANEL
Anion gap: 7 (ref 5–15)
BUN: 20 mg/dL (ref 6–20)
CALCIUM: 9.5 mg/dL (ref 8.9–10.3)
CHLORIDE: 105 mmol/L (ref 101–111)
CO2: 28 mmol/L (ref 22–32)
CREATININE: 0.68 mg/dL (ref 0.44–1.00)
GFR calc non Af Amer: >60 mL/min (ref >60)
GLUCOSE: 89 mg/dL (ref 65–99)
Potassium: 4.5 mmol/L (ref 3.5–5.1)
Sodium: 140 mmol/L (ref 135–145)

## 2015-12-02 LAB — CBC
HCT: 37.1 % (ref 36.0–46.0)
Hemoglobin: 12.1 g/dL (ref 12.0–15.0)
MCH: 30.8 pg (ref 26.0–34.0)
MCHC: 32.6 g/dL (ref 30.0–36.0)
MCV: 94.4 fL (ref 78.0–100.0)
Platelets: 220 10*3/uL (ref 150–400)
RBC: 3.93 MIL/uL (ref 3.87–5.11)
RDW: 13.2 % (ref 11.5–15.5)
WBC: 4.4 10*3/uL (ref 4.0–10.5)

## 2015-12-03 ENCOUNTER — Telehealth: Payer: Self-pay

## 2015-12-03 NOTE — Telephone Encounter (Signed)
Spoke with pt. No changes noted H&P updated

## 2015-12-03 NOTE — Telephone Encounter (Signed)
REVIEWED-NO ADDITIONAL RECOMMENDATIONS. 

## 2015-12-07 ENCOUNTER — Encounter (HOSPITAL_COMMUNITY)
Admission: RE | Disposition: A | Discharge status: Home / Self Care | Payer: Self-pay | Source: Ambulatory Visit | Attending: Gastroenterology

## 2015-12-07 ENCOUNTER — Encounter (HOSPITAL_COMMUNITY): Payer: Self-pay | Admitting: *Deleted

## 2015-12-07 ENCOUNTER — Ambulatory Visit (HOSPITAL_COMMUNITY)
Admission: RE | Admit: 2015-12-07 | Discharge: 2015-12-07 | Disposition: A | Discharge status: Home / Self Care | Payer: Medicaid Other | Source: Ambulatory Visit | Attending: Gastroenterology | Admitting: Gastroenterology

## 2015-12-07 ENCOUNTER — Ambulatory Visit (HOSPITAL_COMMUNITY): Payer: Medicaid Other | Admitting: Anesthesiology

## 2015-12-07 DIAGNOSIS — G35 Multiple sclerosis: Secondary | ICD-10-CM | POA: Diagnosis not present

## 2015-12-07 DIAGNOSIS — K625 Hemorrhage of anus and rectum: Secondary | ICD-10-CM

## 2015-12-07 DIAGNOSIS — Q394 Esophageal web: Secondary | ICD-10-CM | POA: Insufficient documentation

## 2015-12-07 DIAGNOSIS — E119 Type 2 diabetes mellitus without complications: Secondary | ICD-10-CM | POA: Diagnosis not present

## 2015-12-07 DIAGNOSIS — K921 Melena: Secondary | ICD-10-CM | POA: Diagnosis not present

## 2015-12-07 DIAGNOSIS — K259 Gastric ulcer, unspecified as acute or chronic, without hemorrhage or perforation: Secondary | ICD-10-CM | POA: Insufficient documentation

## 2015-12-07 DIAGNOSIS — K6289 Other specified diseases of anus and rectum: Secondary | ICD-10-CM | POA: Diagnosis not present

## 2015-12-07 DIAGNOSIS — K2960 Other gastritis without bleeding: Secondary | ICD-10-CM | POA: Diagnosis not present

## 2015-12-07 DIAGNOSIS — F4310 Post-traumatic stress disorder, unspecified: Secondary | ICD-10-CM | POA: Diagnosis not present

## 2015-12-07 DIAGNOSIS — K2970 Gastritis, unspecified, without bleeding: Secondary | ICD-10-CM | POA: Diagnosis not present

## 2015-12-07 DIAGNOSIS — K219 Gastro-esophageal reflux disease without esophagitis: Secondary | ICD-10-CM | POA: Insufficient documentation

## 2015-12-07 DIAGNOSIS — K449 Diaphragmatic hernia without obstruction or gangrene: Secondary | ICD-10-CM | POA: Insufficient documentation

## 2015-12-07 DIAGNOSIS — F419 Anxiety disorder, unspecified: Secondary | ICD-10-CM | POA: Insufficient documentation

## 2015-12-07 DIAGNOSIS — R1310 Dysphagia, unspecified: Secondary | ICD-10-CM | POA: Insufficient documentation

## 2015-12-07 DIAGNOSIS — E079 Disorder of thyroid, unspecified: Secondary | ICD-10-CM | POA: Diagnosis not present

## 2015-12-07 DIAGNOSIS — F329 Major depressive disorder, single episode, unspecified: Secondary | ICD-10-CM | POA: Diagnosis not present

## 2015-12-07 DIAGNOSIS — Z79899 Other long term (current) drug therapy: Secondary | ICD-10-CM | POA: Diagnosis not present

## 2015-12-07 HISTORY — PX: SAVORY DILATION: SHX5439

## 2015-12-07 HISTORY — PX: BIOPSY: SHX5522

## 2015-12-07 HISTORY — PX: ESOPHAGOGASTRODUODENOSCOPY (EGD) WITH PROPOFOL: SHX5813

## 2015-12-07 HISTORY — PX: COLONOSCOPY WITH PROPOFOL: SHX5780

## 2015-12-07 SURGERY — COLONOSCOPY WITH PROPOFOL
Anesthesia: Monitor Anesthesia Care

## 2015-12-07 MED ORDER — MIDAZOLAM HCL 2 MG/2ML IJ SOLN
1.0000 mg | INTRAMUSCULAR | Status: DC | PRN
  Administered 2015-12-07: 2 mg via INTRAVENOUS

## 2015-12-07 MED ORDER — LIDOCAINE VISCOUS 2 % MT SOLN
15.0000 mL | Freq: Once | OROMUCOSAL | Status: AC
  Administered 2015-12-07: 5 mL via OROMUCOSAL

## 2015-12-07 MED ORDER — LACTATED RINGERS IV SOLN
INTRAVENOUS | Status: DC
  Administered 2015-12-07: 14:00:00 via INTRAVENOUS

## 2015-12-07 MED ORDER — PROPOFOL 10 MG/ML IV BOLUS
INTRAVENOUS | Status: AC
  Filled 2015-12-07: qty 20

## 2015-12-07 MED ORDER — SCOPOLAMINE 1 MG/3DAYS TD PT72
MEDICATED_PATCH | TRANSDERMAL | Status: AC
  Filled 2015-12-07: qty 1

## 2015-12-07 MED ORDER — SCOPOLAMINE 1 MG/3DAYS TD PT72
1.0000 | MEDICATED_PATCH | Freq: Once | TRANSDERMAL | Status: DC
  Administered 2015-12-07: 1.5 mg via TRANSDERMAL

## 2015-12-07 MED ORDER — ONDANSETRON HCL 4 MG/2ML IJ SOLN: 4.0000 mg | Freq: Once | INTRAMUSCULAR | Status: DC | PRN

## 2015-12-07 MED ORDER — MIDAZOLAM HCL 2 MG/2ML IJ SOLN
INTRAMUSCULAR | Status: AC
  Filled 2015-12-07: qty 2

## 2015-12-07 MED ORDER — PROPOFOL 500 MG/50ML IV EMUL
INTRAVENOUS | Status: DC | PRN
  Administered 2015-12-07: 15:00:00 via INTRAVENOUS
  Administered 2015-12-07: 125 ug/kg/min via INTRAVENOUS
  Administered 2015-12-07 (×2): via INTRAVENOUS

## 2015-12-07 MED ORDER — FENTANYL CITRATE (PF) 100 MCG/2ML IJ SOLN: 25.0000 ug | INTRAMUSCULAR | Status: DC | PRN

## 2015-12-07 MED ORDER — MIDAZOLAM HCL 5 MG/5ML IJ SOLN
INTRAMUSCULAR | Status: DC | PRN
  Administered 2015-12-07: 2 mg via INTRAVENOUS

## 2015-12-07 MED ORDER — LIDOCAINE VISCOUS 2 % MT SOLN
OROMUCOSAL | Status: AC
  Filled 2015-12-07: qty 15

## 2015-12-07 NOTE — Anesthesia Postprocedure Evaluation (Signed)
Anesthesia Post Note  Patient: KHRISTIE ARGOMANIZ  Procedure(s) Performed: Procedure(s) (LRB): COLONOSCOPY WITH PROPOFOL (N/A) ESOPHAGOGASTRODUODENOSCOPY (EGD) WITH PROPOFOL (N/A) SAVORY DILATION (N/A) BIOPSY  Patient location during evaluation: PACU Anesthesia Type: MAC Level of consciousness: awake, oriented and patient cooperative Pain management: pain level controlled Vital Signs Assessment: post-procedure vital signs reviewed and stable Respiratory status: spontaneous breathing, nonlabored ventilation and respiratory function stable Cardiovascular status: blood pressure returned to baseline and stable Postop Assessment: no signs of nausea or vomiting Anesthetic complications: no    Last Vitals:  Filed Vitals:   12/07/15 1410 12/07/15 1415  BP: 107/66 112/68  Temp:    Resp: 19 19    Last Pain: There were no vitals filed for this visit.               Kashawn Manzano J

## 2015-12-07 NOTE — Op Note (Addendum)
[facility] Patient Name: Dawn Barnes Procedure Date: 12/07/2015 3:16 PM MRN: [id number] Date of Birth: 11-26-1965 Attending MD: Barney Drain , MD CSN: [id number] Age: 50 Admit Type: Outpatient Procedure:                Upper GI endoscopy WITH ESOPHAGEAL DILATION AND                            COLD FORCEPS BIOPSY Indications:              Dysphagia Providers:                Barney Drain, MD, Lurline Del, RN, Randa Spike,                            Technician Referring MD:             Rosita Fire Medicines:                Propofol per Anesthesia Complications:            No immediate complications. Estimated Blood Loss:     Estimated blood loss was minimal. Procedure:                Pre-Anesthesia Assessment:                           - Prior to the procedure, a History and Physical                            was performed, and patient medications and                            allergies were reviewed. The patient's tolerance of                            previous anesthesia was also reviewed. The risks                            and benefits of the procedure and the sedation                            options and risks were discussed with the patient.                            All questions were answered, and informed consent                            was obtained. Prior Anticoagulants: The patient has                            taken ibuprofen, last dose was day of procedure.                            ASA Grade Assessment: II - A patient with mild  systemic disease. After reviewing the risks and                            benefits, the patient was deemed in satisfactory                            condition to undergo the procedure.                           After obtaining informed consent, the endoscope was                            passed under direct vision. Throughout the                            procedure, the patient's blood pressure,  pulse, and                            oxygen saturations were monitored continuously. The                            [id number]) scope was introduced through the                            mouth, and advanced to the second part of duodenum.                            The upper GI endoscopy was accomplished with ease.                            The patient tolerated the procedure well. Scope In: 3:22:31 PM Scope Out: 3:30:31 PM Total Procedure Duration: 0 hours 8 minutes 0 seconds  Findings:      A web was found in the proximal esophagus. A guidewire was placed and       the scope was withdrawn. Dilation was performed with a Savary dilator       with no resistance at 16 mm and 17 mm. GE JUNCTION WIDE OPEN.      Diffuse moderate inflammation characterized by congestion (edema),       erosions and erythema was found in the gastric body and in the gastric       antrum. Biopsies were taken with a cold forceps for Helicobacter pylori       testing.      The examined duodenum was normal.      A small hiatal hernia was present. 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 .                           - Gastritis, EROSIVE.                           - Small hiatal hernia.                           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                           - Await pathology results.                           - Return to my office in 3 months.                           - Patient has a contact number available for                            emergencies. The signs and symptoms of potential                            delayed complications were discussed with the                            patient. Return to normal activities tomorrow.                            Written discharge instructions were  provided to the                            patient.                           FOLLOW A SOFT MECHANICAL DIET. MEATS SHOULD BE                            CHOPPED OR GROUND ONLY. DO NOT EAT CHUNKS OF                            ANYTHING.                           CONTINUE OMEPRAZOLE once EVERY MORNING.                           Follow up in Aug 9th at 9:00 am with Laban Emperor NP                           Next colonoscopy in 10 years.                           - Soft diet. Procedure Code(s):        --- Professional ---                           [phone], Esophagogastroduodenoscopy, flexible,  transoral; with insertion of guide wire followed by                            passage of dilator(s) through esophagus over guide                            wire                           [id number], Esophagogastroduodenoscopy, flexible,                            transoral; with biopsy, single or multiple Diagnosis Code(s):        --- Professional ---                           Q39.4, Esophageal web                           K29.70, Gastritis, unspecified, without bleeding                           K44.9, Diaphragmatic hernia without obstruction or                            gangrene                           R13.10, Dysphagia, unspecified CPT copyright 2016 [employer]. All rights reserved. The codes documented in this report are preliminary and upon coder review may  be revised to meet current compliance requirements. Barney Drain, MD Barney Drain, MD 12/07/2015 6:08:06 PM This report has been signed electronically. Number of Addenda: 0

## 2015-12-07 NOTE — Progress Notes (Signed)
Swallowing without difficulty.

## 2015-12-07 NOTE — Progress Notes (Signed)
Sprite given to drink. Tolerated well.

## 2015-12-07 NOTE — Progress Notes (Signed)
REVIEWED-NO ADDITIONAL RECOMMENDATIONS. 

## 2015-12-07 NOTE — Progress Notes (Signed)
Eyeglasses returned to pt.

## 2015-12-07 NOTE — H&P (Signed)
Primary Care Physician:  Rosita Fire, MD Primary Gastroenterologist:  Dr. Oneida Alar  Pre-Procedure History & Physical: HPI:  Dawn Barnes is a 49 y.o. female here for DYSPHAGIA/brbpr.  Past Medical History  Diagnosis Date  . Depression   . Anxiety   . Thyroid disease   . Heartburn   . Mixed incontinence   . Constipation 04/30/2013  . MS (multiple sclerosis) ([place])   . Diabetes mellitus without complication (HCC)     borderline  . Urge urinary incontinence 05/10/2015  . GERD (gastroesophageal reflux disease)   . PTSD (post-traumatic stress disorder)   . PONV (postoperative nausea and vomiting)     Past Surgical History  Procedure Laterality Date  . Incontinence surgery    . Tubal ligation    . Endometrial ablation      Prior to Admission medications   Medication Sig Start Date End Date Taking? Authorizing Provider  buPROPion (WELLBUTRIN) 100 MG tablet Take 100 mg by mouth 2 (two) times daily.   Yes Historical Provider, MD  cetirizine (ZYRTEC) 10 MG tablet Take 10 mg by mouth daily.   Yes Historical Provider, MD  Dimethyl Fumarate (TECFIDERA) 240 MG CPDR Take 240 mg by mouth 2 (two) times daily.   Yes Historical Provider, MD  escitalopram (LEXAPRO) 20 MG tablet Take 20 mg by mouth daily.   Yes Historical Provider, MD  fesoterodine (TOVIAZ) 4 MG TB24 tablet Take 8 mg by mouth daily.    Yes Historical Provider, MD  ibuprofen (ADVIL,MOTRIN) 800 MG tablet Take 800 mg by mouth every 8 (eight) hours as needed for mild pain or moderate pain.    Yes Historical Provider, MD  levothyroxine (SYNTHROID, LEVOTHROID) 137 MCG tablet Take 1 tablet (137 mcg total) by mouth daily before breakfast. 10/25/15  Yes Cassandria Anger, MD  naproxen (NAPROSYN) 500 MG tablet Take 500 mg by mouth 2 (two) times daily with a meal.   Yes Historical Provider, MD  omeprazole (PRILOSEC) 20 MG capsule Take 40 mg by mouth daily.    Yes Historical Provider, MD  Vitamin D, Ergocalciferol, (DRISDOL) 50000 units  CAPS capsule TAKE 1 CAPSULE BY MOUTH EVERY 7 DAYS. 11/22/15  Yes Cassandria Anger, MD    Allergies as of 11/03/2015 - Review Complete 10/25/2015  Allergen Reaction Noted  . Tuberculin tests Dermatitis 01/18/2015    Family History  Problem Relation Age of Onset  . Diabetes Father   . Heart disease Father   . Hypertension Paternal Aunt   . Heart disease Paternal Aunt   . Seizures Paternal Aunt   . Heart disease Paternal Grandmother   . Depression Daughter   . Hypertension Sister   . Heart disease Sister   . Depression Son   . ADD / ADHD Son   . Depression Daughter   . Colon cancer Neg Hx     Social History   Social History  . Marital Status: Legally Separated    Spouse Name: N/A  . Number of Children: N/A  . Years of Education: N/A   Occupational History  . disability    Social History Main Topics  . Smoking status: Never Smoker   . Smokeless tobacco: Never Used  . Alcohol Use: No  . Drug Use: No  . Sexual Activity: No     Comment: tubal   Other Topics Concern  . Not on file   Social History Narrative    Review of Systems: See HPI, otherwise negative ROS   Physical Exam: BP 112/68 mmHg  Temp(Src) 98.6 F (37 C) (Oral)  Resp 19  SpO2 100% General:   Alert,  pleasant and cooperative in NAD Head:  Normocephalic and atraumatic. Neck:  Supple; Lungs:  Clear throughout to auscultation.    Heart:  Regular rate and rhythm. Abdomen:  Soft, nontender and nondistended. Normal bowel sounds, without guarding, and without rebound.   Neurologic:  Alert and  oriented x4;  grossly normal neurologically.  Impression/Plan:    DYSPHAGIA/brbpr  PLAN:  TCS/EGD/DIL TODAY

## 2015-12-07 NOTE — Transfer of Care (Signed)
Immediate Anesthesia Transfer of Care Note  Patient: Dawn Barnes  Procedure(s) Performed: Procedure(s) with comments: COLONOSCOPY WITH PROPOFOL (N/A) - 1215 ESOPHAGOGASTRODUODENOSCOPY (EGD) WITH PROPOFOL (N/A) SAVORY DILATION (N/A) BIOPSY - right and left colon biopsies, gastric bx's   Patient Location: PACU  Anesthesia Type:MAC  Level of Consciousness: awake and patient cooperative  Airway & Oxygen Therapy: Patient Spontanous Breathing and Patient connected to face mask oxygen  Post-op Assessment: Report given to RN, Post -op Vital signs reviewed and stable and Patient moving all extremities  Post vital signs: Reviewed and stable  Last Vitals:  Filed Vitals:   12/07/15 1410 12/07/15 1415  BP: 107/66 112/68  Temp:    Resp: 19 19    Last Pain: There were no vitals filed for this visit.    Patients Stated Pain Goal: 5 ([id number])  Complications: No apparent anesthesia complications

## 2015-12-07 NOTE — Discharge Instructions (Signed)
YOU HAVE EROSIVE GASTRITIS DUE TO IBUPROFEN. I STRETCHED YOUR ESOPHAGUS DUE YOUR PROBLEMS SWALLOWING. YOUR RECTAL BLEEDING IS DUE TO MILD PROCTITIS.  I BIOPSIED YOUR RIGHT AND LEFT COLON, AND RECTUM. THE LAST PART OF YOU SMALL BOWEL IS NORMAL.    FOLLOW A SOFT MECHANICAL DIET UNTIL YOU REPAIR YOUR TEETH.  MEATS SHOULD BE CHOPPED OR GROUND ONLY. DO NOT EAT CHUNKS OF ANYTHING. SEE INFO BELOW.   CONTINUE OMEPRAZOLE once EVERY MORNING.  YOUR BIOPSY RESULTS WILL BE AVAILABLE IN MY CHART MAY 12 AND MY OFFICE WILL CONTACT YOU IN 10-14 DAYS WITH YOUR RESULTS.   Follow up in 3 mos.  Aug 9th at 9:00 am with Laban Emperor NP  Next colonoscopy in 10 years.   ENDOSCOPY Care After Read the instructions outlined below and refer to this sheet in the next week. These discharge instructions provide you with general information on caring for yourself after you leave the hospital. While your treatment has been planned according to the most current medical practices available, unavoidable complications occasionally occur. If you have any problems or questions after discharge, call DR. FIELDS, [phone].  ACTIVITY  You may resume your regular activity, but move at a slower pace for the next 24 hours.   Take frequent rest periods for the next 24 hours.   Walking will help get rid of the air and reduce the bloated feeling in your belly (abdomen).   No driving for 24 hours (because of the medicine (anesthesia) used during the test).   You may shower.   Do not sign any important legal documents or operate any machinery for 24 hours (because of the anesthesia used during the test).    NUTRITION  Drink plenty of fluids.   You may resume your normal diet as instructed by your doctor.   Begin with a light meal and progress to your normal diet. Heavy or fried foods are harder to digest and may make you feel sick to your stomach (nauseated).   Avoid alcoholic beverages for 24 hours or as instructed.     MEDICATIONS  You may resume your normal medications.   WHAT YOU CAN EXPECT TODAY  Some feelings of bloating in the abdomen.   Passage of more gas than usual.   Spotting of blood in your stool or on the toilet paper  .  IF YOU HAD POLYPS REMOVED DURING THE ENDOSCOPY:  Eat a soft diet IF YOU HAVE NAUSEA, BLOATING, ABDOMINAL PAIN, OR VOMITING.    FINDING OUT THE RESULTS OF YOUR TEST Not all test results are available during your visit. DR. Oneida Alar WILL CALL YOU WITHIN 14 DAYS OF YOUR PROCEDUE WITH YOUR RESULTS. Do not assume everything is normal if you have not heard from DR. FIELDS, CALL HER OFFICE AT [phone].  SEEK IMMEDIATE MEDICAL ATTENTION AND CALL THE OFFICE: [phone] IF:  You have more than a spotting of blood in your stool.   Your belly is swollen (abdominal distention).   You are nauseated or vomiting.   You have a temperature over 101F.   You have abdominal pain or discomfort that is severe or gets worse throughout the day.    PROCTITIS Proctitis is an inflammation of the lining of the rectum. The rectum is a muscular tube that's connected to the end of your colon. Stool passes through the rectum on its way out of the body. Proctitis can cause rectal pain and the continuous sensation that you need to have a bowel movement. Proctitis symptoms can  be short-lived, or they can become chronic.  Proctitis signs and symptoms may include: A frequent or continuous feeling that you need to have a bowel movement  Rectal bleeding  The passing of mucus through your rectum  Rectal pain  Pain on the left side of your abdomen  A feeling of fullness in your rectum  Diarrhea  Pain with bowel movements   SOFT MECHANICAL DIET This SOFT MECHANICAL DIET is restricted to:  Foods that are moist, soft-textured, and easy to chew and swallow.   Meats that are ground or are minced no larger than one-quarter inch pieces. Meats are moist with gravy or sauce added.    Foods that do not include bread or bread-like textures except soft pancakes, well-moistened with syrup or sauce.   Textures with some chewing ability required.   Casseroles without rice.   Cooked vegetables that are less than half an inch in size and easily mashed with a fork. No cooked corn, peas, broccoli, cauliflower, cabbage, Brussels sprouts, asparagus, or other fibrous, non-tender or rubbery cooked vegetables.   Canned fruit except for pineapple. Fruit must be cut into pieces no larger than half an inch in size.   Foods that do not include nuts, seeds, coconut, or sticky textures.   FOOD TEXTURES FOR DYSPHAGIA DIET LEVEL 2 -SOFT MECHANICAL DIET (includes all foods on Dysphagia Diet Level 1 - Pureed, in addition to the foods listed below)  FOOD GROUP: Breads. RECOMMENDED: Soft pancakes, well-moistened with syrup or sauce.  AVOID: All others.  FOOD GROUP: Cereals.  RECOMMENDED: Cooked cereals with little texture, including oatmeal. Unprocessed wheat bran stirred into cereals for bulk. Note: If thin liquids are restricted, it is important that all of the liquid is absorbed into the cereal.  AVOID: All dry cereals and any cooked cereals that may contain flax seeds or other seeds or nuts. Whole-grain, dry, or coarse cereals. Cereals with nuts, seeds, dried fruit, and/or coconut.  FOOD GROUP: Desserts. RECOMMENDED: Pudding, custard. Soft fruit pies with bottom crust only. Canned fruit (excluding pineapple). Soft, moist cakes with icing.Frozen malts, milk shakes, frozen yogurt, eggnog, nutritional supplements, ice cream, sherbet, regular or sugar-free gelatin, or any foods that become thin liquid at either room (70 F) or body temperature (98 F).  AVOID: Dry, coarse cakes and cookies. Anything with nuts, seeds, coconut, pineapple, or dried fruit. Breakfast yogurt with nuts. Rice or bread pudding.  FOOD GROUP: Fats. RECOMMENDED: Butter, margarine, cream for cereal (depending on  liquid consistency recommendations), gravy, cream sauces, sour cream, sour cream dips with soft additives, mayonnaise, salad dressings, cream cheese, cream cheese spreads with soft additives, whipped toppings.  AVOID: All fats with coarse or chunky additives.  FOOD GROUP: Fruits. RECOMMENDED: Soft drained, canned, or cooked fruits without seeds or skin. Fresh soft and ripe banana. Fruit juices with a small amount of pulp. If thin liquids are restricted, fruit juices should be thickened to appropriate consistency.  AVOID: Fresh or frozen fruits. Cooked fruit with skin or seeds. Dried fruits. Fresh, canned, or cooked pineapple.  FOOD GROUP: Meats and Meat Substitutes. (Meat pieces should not exceed 1/4 of an inch cube and should be tender.) RECOMMENDED: Moistened ground or cooked meat, poultry, or fish. Moist ground or tender meat may be served with gravy or sauce. Casseroles without rice. Moist macaroni and cheese, well-cooked pasta with meat sauce, tuna noodle casserole, soft, moist lasagna. Moist meatballs, meatloaf, or fish loaf. Protein salads, such as tuna or egg without large chunks, celery, or onion.  Cottage cheese, smooth quiche without large chunks. Poached, scrambled, or soft-cooked eggs (egg yolks should not be runny but should be moist and able to be mashed with butter, margarine, or other moisture added to them). (Cook eggs to 160 F or use pasteurized eggs for safety.) Souffls may have small, soft chunks. Tofu. Well-cooked, slightly mashed, moist legumes, such as baked beans. All meats or protein substitutes should be served with sauces or moistened to help maintain cohesiveness in the oral cavity.  AVOID: Dry meats, tough meats (such as bacon, sausage, hot dogs, bratwurst). Dry casseroles or casseroles with rice or large chunks. Peanut butter. Cheese slices and cubes. Hard-cooked or crisp fried eggs. Sandwiches.Pizza.  FOOD GROUP: Potatoes and Starches. RECOMMENDED: Well-cooked,  moistened, boiled, baked, or mashed potatoes. Well-cooked shredded hash brown potatoes that are not crisp. (All potatoes need to be moist and in sauces.)Well-cooked noodles in sauce. Spaetzel or soft dumplings that have been moistened with butter or gravy.  AVOID: Potato skins and chips. Fried or French-fried potatoes. Rice.  FOOD GROUP: Soups. RECOMMENDED: Soups with easy-to-chew or easy-to-swallow meats or vegetables: Particle sizes in soups should be less than 1/2 inch. Soups will need to be thickened to appropriate consistency if soup is thinner than prescribed liquid consistency.  AVOID: Soups with large chunks of meat and vegetables. Soups with rice, corn, peas.  FOOD GROUP: Vegetables. RECOMMENDED: All soft, well-cooked vegetables. Vegetables should be less than a half inch. Should be easily mashed with a fork.  AVOID: Cooked corn and peas. Broccoli, cabbage, Brussels sprouts, asparagus, or other fibrous, non-tender or rubbery cooked vegetables.  FOOD GROUP: Miscellaneous. RECOMMENDED: Jams and preserves without seeds, jelly. Sauces, salsas, etc., that may have small tender chunks less than 1/2 inch. Soft, smooth chocolate bars that are easily chewed.  AVOID: Seeds, nuts, coconut, or sticky foods. Chewy candies such as caramels or licorice.    Gastritis  Gastritis is an inflammation (the body's way of reacting to injury and/or infection) of the stomach. It is often caused by viral or bacterial (germ) infections. It can also be caused BY ASPIRIN, BC/GOODY POWDER'S, (IBUPROFEN) MOTRIN, OR ALEVE (NAPROXEN), chemicals (including alcohol), SPICY FOODS, and medications. This illness may be associated with generalized malaise (feeling tired, not well), UPPER ABDOMINAL STOMACH cramps, and fever. One common bacterial cause of gastritis is an organism known as H. Pylori. This can be treated with antibiotics.

## 2015-12-07 NOTE — Anesthesia Preprocedure Evaluation (Signed)
Anesthesia Evaluation  Patient identified by MRN, date of birth, ID band Patient awake    Reviewed: Allergy & Precautions, NPO status , Patient's Chart, lab work & pertinent test results  History of Anesthesia Complications (+) PONV  Airway Mallampati: II  TM Distance: >3 FB Neck ROM: Full    Dental  (+)    Pulmonary    Pulmonary exam normal        Cardiovascular Normal cardiovascular exam     Neuro/Psych Anxiety Depression  Neuromuscular disease    GI/Hepatic GERD  Medicated,  Endo/Other  diabetes, Type 2Hypothyroidism   Renal/GU      Musculoskeletal   Abdominal (+) + obese,   Peds  Hematology   Anesthesia Other Findings   Reproductive/Obstetrics                             Anesthesia Physical Anesthesia Plan  ASA: III  Anesthesia Plan: MAC   Post-op Pain Management:    Induction: Intravenous  Airway Management Planned: Nasal Cannula  Additional Equipment:   Intra-op Plan:   Post-operative Plan:   Informed Consent: I have reviewed the patients History and Physical, chart, labs and discussed the procedure including the risks, benefits and alternatives for the proposed anesthesia with the patient or authorized representative who has indicated his/her understanding and acceptance.   Dental advisory given  Plan Discussed with:   Anesthesia Plan Comments:         Anesthesia Quick Evaluation

## 2015-12-07 NOTE — Op Note (Addendum)
[facility] Patient Name: Zamantha Ziller Procedure Date: 12/07/2015 2:29 PM MRN: [id number] Date of Birth: [birth year] Attending MD: Barney Drain , MD CSN: [id number] Age: 50 Admit Type: Outpatient Procedure:                Colonoscopy WITH COLD FORCEPS BIOPSY Indications:              Hematochezia Providers:                Barney Drain, MD, Lurline Del, RN, Randa Spike,                            Technician Referring MD:             Rosita Fire Medicines:                Propofol per Anesthesia Complications:            No immediate complications. Estimated Blood Loss:     Estimated blood loss was minimal. Procedure:                Pre-Anesthesia Assessment:                           - Prior to the procedure, a History and Physical                            was performed, and patient medications and                            allergies were reviewed. The patient's tolerance of                            previous anesthesia was also reviewed. The risks                            and benefits of the procedure and the sedation                            options and risks were discussed with the patient.                            All questions were answered, and informed consent                            was obtained. Prior Anticoagulants: The patient has                            taken ibuprofen, last dose was day of procedure.                            ASA Grade Assessment: II - A patient with mild                            systemic disease. After reviewing the risks and  benefits, the patient was deemed in satisfactory                            condition to undergo the procedure.                           After obtaining informed consent, the colonoscope                            was passed under direct vision. Throughout the                            procedure, the patient's blood pressure, pulse, and                            oxygen  saturations were monitored continuously. The                            [id number]) scope was introduced through                            the anus and advanced to the the terminal ileum.                            The colonoscopy was somewhat difficult due to a                            tortuous colon. Successful completion of the                            procedure was aided by changing the patient to a                            supine position and using manual pressure. The                            patient tolerated the procedure well. The quality                            of the bowel preparation was good. The terminal                            ileum, ileocecal valve, appendiceal orifice, and                            rectum were photographed. Scope In: 2:55:02 PM Scope Out: 3:16:33 PM Scope Withdrawal Time: 0 hours 17 minutes 5 seconds  Total Procedure Duration: 0 hours 21 minutes 31 seconds  Findings:      The entire examined colon appeared normal. This was biopsied with a cold       forceps for histology IN RIGHT COLON, LEFT COLON, AND RECTUM.Marland Kitchen      The exam was otherwise without abnormality.      A segmental area of mildly erythematous and friable (with contact  bleeding) mucosa was found in the rectum. This was biopsied with a cold       forceps for histology. Impression:               - NORMAL COLON                           - MILD PROCTITIS-RECTAL BLEEDING MOST LIKELY DUE TO                            PROCTITIS Moderate Sedation:      Per Anesthesia Care Recommendation:           - Resume previous diet.                           - Continue present medications.                           - Await pathology results.                           - Repeat colonoscopy in 10 years for surveillance.                           - Patient has a contact number available for                            emergencies. The signs and symptoms of potential                             delayed complications were discussed with the                            patient. Return to normal activities tomorrow.                            Written discharge instructions were provided to the                            patient.                           FOLLOW A SOFT MECHANICAL DIET UNTIL YOU REPAIR YOUR                            TEETH. MEATS SHOULD BE CHOPPED OR GROUND ONLY. DO                            NOT EAT CHUNKS OF ANYTHING.                           CONTINUE OMEPRAZOLE once EVERY MORNING.                           Follow up in Aug 9th at 9:00 am with Laban Emperor NP Procedure Code(s):        ---  Professional ---                           [phone], Colonoscopy, flexible; with biopsy, single                            or multiple Diagnosis Code(s):        --- Professional ---                           K62.89, Other specified diseases of anus and rectum                           K62.5, Hemorrhage of anus and rectum                           K92.1, Melena (includes Hematochezia) CPT copyright 2016 [employer]. All rights reserved. The codes documented in this report are preliminary and upon coder review may  be revised to meet current compliance requirements. Barney Drain, MD Barney Drain, MD 12/07/2015 6:12:50 PM This report has been signed electronically. Number of Addenda: 0

## 2015-12-16 ENCOUNTER — Encounter (HOSPITAL_COMMUNITY): Payer: Self-pay | Admitting: Gastroenterology

## 2015-12-21 ENCOUNTER — Telehealth: Payer: Self-pay | Admitting: Gastroenterology

## 2015-12-21 NOTE — Telephone Encounter (Signed)
[place] PATIENT ABOUT HER TCS BIOPSY RESULTS

## 2015-12-22 NOTE — Telephone Encounter (Signed)
Pt is aware that Dr. Oneida Alar has been out some and I will let her know that pt called for results.

## 2015-12-23 ENCOUNTER — Telehealth: Payer: Self-pay | Admitting: Gastroenterology

## 2015-12-23 NOTE — Telephone Encounter (Signed)
Pt is aware of results. 

## 2015-12-23 NOTE — Telephone Encounter (Signed)
See results note. Pt is aware.  

## 2015-12-23 NOTE — Telephone Encounter (Signed)
Please call pt. HER stomach Bx shows gastritis DUE TO IBUPROFEN AND NAPROXEN.  Her colon Bx are normal. Rectal biopsies show proctitis-inflammation likely due to ibuprofen and naproxen.   FOLLOW A SOFT MECHANICAL DIET UNTIL YOU REPAIR YOUR TEETH. MEATS SHOULD BE CHOPPED OR GROUND ONLY. DO NOT EAT CHUNKS OF ANYTHING.   CONTINUE OMEPRAZOLE once EVERY MORNING.  Follow up in  Aug 9th at 9:00 am.  Next colonoscopy in 10 years.

## 2015-12-23 NOTE — Telephone Encounter (Signed)
Called and LMOM for a return call.  

## 2015-12-23 NOTE — Telephone Encounter (Signed)
Pt was returning a call to DS. Please call her back [phone]

## 2015-12-23 NOTE — Telephone Encounter (Signed)
Reminder in epic °

## 2015-12-23 NOTE — Telephone Encounter (Signed)
LMOM to call.

## 2016-01-11 ENCOUNTER — Encounter: Payer: Self-pay | Admitting: Adult Health

## 2016-01-11 ENCOUNTER — Ambulatory Visit (INDEPENDENT_AMBULATORY_CARE_PROVIDER_SITE_OTHER): Payer: Medicaid Other | Admitting: Adult Health

## 2016-01-11 VITALS — BP 98/68 | HR 72 | Ht 64.0 in | Wt 204.0 lb

## 2016-01-11 DIAGNOSIS — R232 Flushing: Secondary | ICD-10-CM | POA: Insufficient documentation

## 2016-01-11 DIAGNOSIS — N939 Abnormal uterine and vaginal bleeding, unspecified: Secondary | ICD-10-CM | POA: Insufficient documentation

## 2016-01-11 DIAGNOSIS — N951 Menopausal and female climacteric states: Secondary | ICD-10-CM

## 2016-01-11 HISTORY — DX: Abnormal uterine and vaginal bleeding, unspecified: N93.9

## 2016-01-11 HISTORY — DX: Flushing: R23.2

## 2016-01-11 NOTE — Progress Notes (Signed)
Subjective:     Patient ID: Dawn Barnes, female   DOB: 16-Mar-1966, 50 y.o.   MRN: [id number]  HPI Dawn Barnes is a 50 year old white female, divorced in complaining of vaginal spotting and bleeding, she is sp ablation and has some hot flashes and has history of urge urinary incontinence.No bleeding today. PCP is Dr Legrand Rams and she sees Dr Dorris Fetch, Dr Oneida Alar and Dr Prince Rome at Dr Freddie Apley office for her MS.   Review of Systems Patient denies any headaches, hearing loss, fatigue, blurred vision, shortness of breath, chest pain, abdominal pain, problems with bowel movements, or intercourse(not having sex. No joint pain or mood swings.See HPI for positives. Reviewed past medical,surgical, social and family history. Reviewed medications and allergies.     Objective:   Physical Exam BP 98/68 mmHg  Pulse 72  Ht 5\' 4"  (1.626 m)  Wt 204 lb (92.534 kg)  BMI 35.00 kg/m2  LMP  Skin warm and dry.Pelvic: external genitalia is normal in appearance no lesions, vagina: pale with loss of rugae and moisture,urethra has no lesions or masses noted, cervix:smooth and bulbous, uterus: normal size, shape and contour, non tender, no masses felt, adnexa: no masses or tenderness noted. Bladder is non tender and no masses felt. Abdomen is soft and non tender.   Discussed getting [place] to assess uterus and check FSH, could be menopause  Face time 15 minutes with 50 % counseling.  Assessment:     Vaginal bleeding Hot flashes    Plan:     Check FSH Return in 1 week for gyn [place] Will talk when results in

## 2016-01-11 NOTE — Patient Instructions (Signed)
Return in 1 week for [place] Will talk when labs back

## 2016-01-12 ENCOUNTER — Telehealth: Payer: Self-pay | Admitting: Adult Health

## 2016-01-12 LAB — FOLLICLE STIMULATING HORMONE: FSH: 4.1 m[IU]/mL

## 2016-01-12 NOTE — Telephone Encounter (Signed)
Pt aware FSH 4.1, not menopausal yet

## 2016-01-17 ENCOUNTER — Encounter (HOSPITAL_COMMUNITY)
Admission: RE | Admit: 2016-01-17 | Discharge: 2016-01-17 | Disposition: A | Discharge status: Home / Self Care | Payer: Medicaid Other | Source: Ambulatory Visit | Attending: Neurology | Admitting: Neurology

## 2016-01-17 ENCOUNTER — Other Ambulatory Visit: Payer: Self-pay | Admitting: "Endocrinology

## 2016-01-17 DIAGNOSIS — G35 Multiple sclerosis: Secondary | ICD-10-CM | POA: Insufficient documentation

## 2016-01-17 MED ORDER — SODIUM CHLORIDE 0.9 % IV SOLN
INTRAVENOUS | Status: DC
  Administered 2016-01-17: 14:00:00 via INTRAVENOUS

## 2016-01-17 MED ORDER — SODIUM CHLORIDE 0.9 % IV SOLN
1000.0000 mg | Freq: Every day | INTRAVENOUS | Status: DC
  Administered 2016-01-17: 1000 mg via INTRAVENOUS
  Filled 2016-01-17: qty 8

## 2016-01-18 ENCOUNTER — Encounter (HOSPITAL_COMMUNITY)
Admission: RE | Admit: 2016-01-18 | Discharge: 2016-01-18 | Disposition: A | Discharge status: Home / Self Care | Payer: Medicaid Other | Source: Ambulatory Visit | Attending: Neurology | Admitting: Neurology

## 2016-01-18 DIAGNOSIS — G35 Multiple sclerosis: Secondary | ICD-10-CM | POA: Diagnosis not present

## 2016-01-18 LAB — T4, FREE: FREE T4: 1.3 ng/dL (ref 0.8–1.8)

## 2016-01-18 LAB — TSH: TSH: 0.04 m[IU]/L — AB

## 2016-01-18 MED ORDER — SODIUM CHLORIDE 0.9 % IV SOLN
INTRAVENOUS | Status: DC
  Administered 2016-01-18: 14:00:00 via INTRAVENOUS

## 2016-01-18 MED ORDER — SODIUM CHLORIDE 0.9 % IV SOLN
1000.0000 mg | Freq: Every day | INTRAVENOUS | Status: DC
  Administered 2016-01-18: 1000 mg via INTRAVENOUS
  Filled 2016-01-18: qty 8

## 2016-01-19 ENCOUNTER — Encounter (HOSPITAL_COMMUNITY)
Admission: RE | Admit: 2016-01-19 | Discharge: 2016-01-19 | Disposition: A | Discharge status: Home / Self Care | Payer: Medicaid Other | Source: Ambulatory Visit | Attending: Neurology | Admitting: Neurology

## 2016-01-19 ENCOUNTER — Other Ambulatory Visit: Payer: Medicaid Other

## 2016-01-19 ENCOUNTER — Ambulatory Visit (INDEPENDENT_AMBULATORY_CARE_PROVIDER_SITE_OTHER): Payer: Medicaid Other

## 2016-01-19 DIAGNOSIS — R319 Hematuria, unspecified: Secondary | ICD-10-CM

## 2016-01-19 DIAGNOSIS — D251 Intramural leiomyoma of uterus: Secondary | ICD-10-CM | POA: Diagnosis not present

## 2016-01-19 DIAGNOSIS — B854 Mixed pediculosis and phthiriasis: Secondary | ICD-10-CM | POA: Diagnosis not present

## 2016-01-19 DIAGNOSIS — G35 Multiple sclerosis: Secondary | ICD-10-CM | POA: Diagnosis not present

## 2016-01-19 DIAGNOSIS — N939 Abnormal uterine and vaginal bleeding, unspecified: Secondary | ICD-10-CM | POA: Diagnosis not present

## 2016-01-19 DIAGNOSIS — D250 Submucous leiomyoma of uterus: Secondary | ICD-10-CM | POA: Diagnosis not present

## 2016-01-19 MED ORDER — SODIUM CHLORIDE 0.9 % IV SOLN
Freq: Once | INTRAVENOUS | Status: AC
  Administered 2016-01-19: 250 mL via INTRAVENOUS

## 2016-01-19 MED ORDER — SODIUM CHLORIDE 0.9 % IV SOLN
1000.0000 mg | Freq: Every day | INTRAVENOUS | Status: AC
  Administered 2016-01-19: 1000 mg via INTRAVENOUS
  Filled 2016-01-19: qty 8

## 2016-01-19 NOTE — Progress Notes (Signed)
PELVIC US TA/TV: heterogenous anteverted uterus w/mult fibroids,(#1) submucosal fibroid post rt 6 x 4 x 4.8cm,(#2) intramural fibroid post lt 3.3 x 3.9 x 2.5 cm,EEC 5.7 mm,endometrium appears to be distorted by the post right fibroid,normal ov's bilat (mobile),no free fluid,no pain during ultrasound

## 2016-01-19 NOTE — Progress Notes (Signed)
Pt here for [place] and told Amber she was having back pain and urinary frequency. I spoke with JAG and she advised to send urine for UA and culture. Urine was sent to lab. [place]

## 2016-01-20 ENCOUNTER — Ambulatory Visit (INDEPENDENT_AMBULATORY_CARE_PROVIDER_SITE_OTHER): Payer: Medicaid Other | Admitting: Orthopaedic Surgery

## 2016-01-20 ENCOUNTER — Encounter: Payer: Self-pay | Admitting: Orthopaedic Surgery

## 2016-01-20 ENCOUNTER — Other Ambulatory Visit: Payer: Self-pay | Admitting: "Endocrinology

## 2016-01-20 VITALS — BP 133/82 | HR 70 | Temp 97.9°F | Ht 64.0 in | Wt 202.4 lb

## 2016-01-20 DIAGNOSIS — M25562 Pain in left knee: Secondary | ICD-10-CM | POA: Diagnosis not present

## 2016-01-20 LAB — MICROSCOPIC EXAMINATION
CASTS: NONE SEEN /LPF
Epithelial Cells (non renal): >10 /hpf — AB (ref 0–10)

## 2016-01-20 LAB — URINALYSIS, ROUTINE W REFLEX MICROSCOPIC
Bilirubin, UA: NEGATIVE
Glucose, UA: NEGATIVE
Ketones, UA: NEGATIVE
LEUKOCYTES UA: NEGATIVE
Nitrite, UA: NEGATIVE
PH UA: 6 (ref 5.0–7.5)
Specific Gravity, UA: >=1.03 — AB (ref 1.005–1.030)
Urobilinogen, Ur: 0.2 mg/dL (ref 0.2–1.0)

## 2016-01-20 NOTE — Progress Notes (Signed)
Patient [id number] Dawn Barnes, female DOB:Sep 22, 1965, 50 y.o. [id number]  Chief Complaint  Patient presents with  . Follow-up    Bilateral knee pain    HPI  Dawn Barnes is a 50 y.o. female who has bilateral knee pain but more on the left side.  She has no giving way, no locking, no redness.  She has swelling and popping.  She had recent GI workup and has been told to stop all NSAIDs.  She has.  Her knee pain is a little more.   HPI  Body mass index is 34.72 kg/(m^2).  ROS  Review of Systems  HENT: Negative for congestion.   Respiratory: Negative for cough and shortness of breath.   Cardiovascular: Negative for chest pain.  Endocrine: Positive for cold intolerance.  Musculoskeletal: Positive for myalgias, joint swelling, arthralgias and gait problem.  Allergic/Immunologic: Positive for environmental allergies.  Neurological: Negative for numbness.    Past Medical History  Diagnosis Date  . Depression   . Anxiety   . Thyroid disease   . Heartburn   . Mixed incontinence   . Constipation 04/30/2013  . MS (multiple sclerosis) ([place])   . Diabetes mellitus without complication (HCC)     borderline  . Urge urinary incontinence 05/10/2015  . GERD (gastroesophageal reflux disease)   . PTSD (post-traumatic stress disorder)   . PONV (postoperative nausea and vomiting)   . Pancreatitis   . Vaginal bleeding 01/11/2016  . Hot flashes 01/11/2016    Past Surgical History  Procedure Laterality Date  . Incontinence surgery    . Tubal ligation    . Endometrial ablation    . Colonoscopy with propofol N/A 12/07/2015    Procedure: COLONOSCOPY WITH PROPOFOL;  Surgeon: Danie Binder, MD;  [facility];  Service: Endoscopy;  Laterality: N/A;  1215  . Esophagogastroduodenoscopy (egd) with propofol N/A 12/07/2015    Procedure: ESOPHAGOGASTRODUODENOSCOPY (EGD) WITH PROPOFOL;  Surgeon: Danie Binder, MD;  [facility];  Service: Endoscopy;  Laterality: N/A;  . Savory  dilation N/A 12/07/2015    Procedure: SAVORY DILATION;  Surgeon: Danie Binder, MD;  [facility];  Service: Endoscopy;  Laterality: N/A;  . Biopsy  12/07/2015    Procedure: BIOPSY;  Surgeon: Danie Binder, MD;  [facility];  Service: Endoscopy;;  right and left colon biopsies, gastric bx's     Family History  Problem Relation Age of Onset  . Diabetes Father   . Heart disease Father   . Hypertension Paternal Aunt   . Heart disease Paternal Aunt   . Seizures Paternal Aunt   . Heart disease Paternal Grandmother   . Depression Daughter   . Hypertension Sister   . Heart disease Sister   . Depression Son   . ADD / ADHD Son   . Depression Daughter   . Colon cancer Neg Hx     Social History Social History  Substance Use Topics  . Smoking status: Never Smoker   . Smokeless tobacco: Never Used  . Alcohol Use: No    Allergies  Allergen Reactions  . Nsaids Other (See Comments)    Pt states MD told her not to take due to stomach irritation  . Tuberculin Tests Dermatitis    Current Outpatient Prescriptions  Medication Sig Dispense Refill  . cetirizine (ZYRTEC) 10 MG tablet Take 10 mg by mouth daily.    . Dimethyl Fumarate (TECFIDERA) 240 MG CPDR Take 240 mg by mouth 2 (two)  times daily.    Mariane Baumgarten Sodium (COLACE PO) Take by mouth as needed.    Marland Kitchen escitalopram (LEXAPRO) 20 MG tablet Take 20 mg by mouth daily.    . fesoterodine (TOVIAZ) 4 MG TB24 tablet Take 8 mg by mouth daily.     Marland Kitchen levothyroxine (SYNTHROID, LEVOTHROID) 137 MCG tablet Take 1 tablet (137 mcg total) by mouth daily before breakfast. 30 tablet 3  . omeprazole (PRILOSEC) 20 MG capsule Take 40 mg by mouth daily.     Marland Kitchen UNABLE TO FIND Vit B 12 injection-every other month    . Vitamin D, Ergocalciferol, (DRISDOL) 50000 units CAPS capsule TAKE 1 CAPSULE BY MOUTH EVERY 7 DAYS. 4 capsule 2   No current facility-administered medications for this visit.     Physical Exam  Blood pressure 133/82,  pulse 70, temperature 97.9 F (36.6 C), height 5\' 4"  (1.626 m), weight 202 lb 6.4 oz (91.808 kg), last menstrual period 01/18/2016.  Constitutional: overall normal hygiene, normal nutrition, well developed, normal grooming, normal body habitus. Assistive device:none  Musculoskeletal: gait and station Limp left, muscle tone and strength are normal, no tremors or atrophy is present.  .  Neurological: coordination overall normal.  Deep tendon reflex/nerve stretch intact.  Sensation normal.  Cranial nerves II-XII intact.   Skin:   normal overall no scars, lesions, ulcers or rashes. No psoriasis.  Psychiatric: Alert and oriented x 3.  Recent memory intact, remote memory unclear.  Normal mood and affect. Well groomed.  Good eye contact.  Cardiovascular: overall no swelling, no varicosities, no edema bilaterally, normal temperatures of the legs and arms, no clubbing, cyanosis and good capillary refill.  Lymphatic: palpation is normal.  The left lower extremity is examined:  Inspection:  Thigh:  Non-tender and no defects  Knee has swelling 1+ effusion.                        Joint tenderness is present                        Patient is tender over the medial joint line  Lower Leg:  Has normal appearance and no tenderness or defects  Ankle:  Non-tender and no defects  Foot:  Non-tender and no defects Range of Motion:  Knee:  Range of motion is: 0-105                        Crepitus is  present  Ankle:  Range of motion is normal. Strength and Tone:  The left lower extremity has normal strength and tone. Stability:  Knee:  The knee is stable.  Ankle:  The ankle is stable.    The patient has been educated about the nature of the problem(s) and counseled on treatment options.  The patient appeared to understand what I have discussed and is in agreement with it.  Encounter Diagnosis  Name Primary?  . Left knee pain Yes    PLAN Call if any problems.  Precautions discussed.  Continue  current medications.   Return to clinic 3 months   Electronically Signed Sanjuana Kava, MD 6/22/20172:10 PM

## 2016-01-21 ENCOUNTER — Telehealth: Payer: Self-pay | Admitting: Adult Health

## 2016-01-21 ENCOUNTER — Encounter: Payer: Self-pay | Admitting: Adult Health

## 2016-01-21 DIAGNOSIS — D251 Intramural leiomyoma of uterus: Secondary | ICD-10-CM | POA: Insufficient documentation

## 2016-01-21 HISTORY — DX: Intramural leiomyoma of uterus: D25.1

## 2016-01-21 LAB — URINE CULTURE

## 2016-01-21 NOTE — Telephone Encounter (Signed)
Left message urine showed no growth and US shows fibroids

## 2016-01-24 ENCOUNTER — Telehealth: Payer: Self-pay | Admitting: *Deleted

## 2016-01-24 NOTE — Telephone Encounter (Signed)
Spoke with pt. Pt states she was supposed to call you back today regarding [place] results. Thanks!! Marcie Bal

## 2016-01-24 NOTE — Telephone Encounter (Signed)
Pt aware of [place], will follow fibroids for now,

## 2016-01-27 ENCOUNTER — Encounter: Payer: Self-pay | Admitting: "Endocrinology

## 2016-01-27 ENCOUNTER — Ambulatory Visit (INDEPENDENT_AMBULATORY_CARE_PROVIDER_SITE_OTHER): Payer: Medicaid Other | Admitting: "Endocrinology

## 2016-01-27 VITALS — BP 132/86 | HR 76 | Ht 64.0 in | Wt 199.0 lb

## 2016-01-27 DIAGNOSIS — E559 Vitamin D deficiency, unspecified: Secondary | ICD-10-CM | POA: Diagnosis not present

## 2016-01-27 DIAGNOSIS — E038 Other specified hypothyroidism: Secondary | ICD-10-CM

## 2016-01-27 DIAGNOSIS — E669 Obesity, unspecified: Secondary | ICD-10-CM | POA: Diagnosis not present

## 2016-01-27 DIAGNOSIS — R7303 Prediabetes: Secondary | ICD-10-CM | POA: Diagnosis not present

## 2016-01-27 NOTE — Progress Notes (Signed)
Subjective:    Patient ID: Dawn Barnes, female    DOB: 1966-03-29, PCP Rosita Fire, MD   Past Medical History  Diagnosis Date  . Depression   . Anxiety   . Thyroid disease   . Heartburn   . Mixed incontinence   . Constipation 04/30/2013  . MS (multiple sclerosis) ([place])   . Diabetes mellitus without complication (HCC)     borderline  . Urge urinary incontinence 05/10/2015  . GERD (gastroesophageal reflux disease)   . PTSD (post-traumatic stress disorder)   . PONV (postoperative nausea and vomiting)   . Pancreatitis   . Vaginal bleeding 01/11/2016  . Hot flashes 01/11/2016  . Fibroids, intramural 01/21/2016   Past Surgical History  Procedure Laterality Date  . Incontinence surgery    . Tubal ligation    . Endometrial ablation    . Colonoscopy with propofol N/A 12/07/2015    Procedure: COLONOSCOPY WITH PROPOFOL;  Surgeon: Danie Binder, MD;  [facility];  Service: Endoscopy;  Laterality: N/A;  1215  . Esophagogastroduodenoscopy (egd) with propofol N/A 12/07/2015    Procedure: ESOPHAGOGASTRODUODENOSCOPY (EGD) WITH PROPOFOL;  Surgeon: Danie Binder, MD;  [facility];  Service: Endoscopy;  Laterality: N/A;  . Savory dilation N/A 12/07/2015    Procedure: SAVORY DILATION;  Surgeon: Danie Binder, MD;  [facility];  Service: Endoscopy;  Laterality: N/A;  . Biopsy  12/07/2015    Procedure: BIOPSY;  Surgeon: Danie Binder, MD;  [facility];  Service: Endoscopy;;  right and left colon biopsies, gastric bx's    Social History   Social History  . Marital Status: Legally Separated    Spouse Name: N/A  . Number of Children: N/A  . Years of Education: N/A   Occupational History  . disability    Social History Main Topics  . Smoking status: Never Smoker   . Smokeless tobacco: Never Used  . Alcohol Use: No  . Drug Use: No  . Sexual Activity: No     Comment: tubal and ablation   Other Topics Concern  . None   Social History  Narrative   Outpatient Encounter Prescriptions as of 01/27/2016  Medication Sig  . cetirizine (ZYRTEC) 10 MG tablet Take 10 mg by mouth daily.  . Dimethyl Fumarate (TECFIDERA) 240 MG CPDR Take 240 mg by mouth 2 (two) times daily.  Mariane Baumgarten Sodium (COLACE PO) Take by mouth as needed.  Marland Kitchen escitalopram (LEXAPRO) 20 MG tablet Take 20 mg by mouth daily.  . fesoterodine (TOVIAZ) 4 MG TB24 tablet Take 8 mg by mouth daily.   Marland Kitchen levothyroxine (SYNTHROID, LEVOTHROID) 137 MCG tablet Take 1 tablet (137 mcg total) by mouth daily before breakfast.  . omeprazole (PRILOSEC) 20 MG capsule Take 40 mg by mouth daily.   Marland Kitchen UNABLE TO FIND Vit B 12 injection-every other month  . Vitamin D, Ergocalciferol, (DRISDOL) 50000 units CAPS capsule TAKE 1 CAPSULE BY MOUTH EVERY 7 DAYS.   No facility-administered encounter medications on file as of 01/27/2016.   ALLERGIES: Allergies  Allergen Reactions  . Nsaids Other (See Comments)    Pt states MD told her not to take due to stomach irritation  . Tuberculin Tests Dermatitis   VACCINATION STATUS: Immunization History  Administered Date(s) Administered  . Influenza-Unspecified 04/30/2014, 03/15/2015    HPI  50 year old female patient with medical history of hypothyroidism from Hashimoto's thyroiditis. She is on levothyroxine 137 g by mouth every morning. She has  reasonable compliance. She has Lost 6 pounds since last visit. She denies heat/cold complaint, still complains of fatigue, and dry skin.  She also has history of prediabetes on exercise and diet.   Review of Systems Constitutional: +weight loss of 6 pounds , + fatigue, no subjective hyperthermia/hypothermia Eyes: no blurry vision, no xerophthalmia ENT: no sore throat, no nodules palpated in throat, no dysphagia/odynophagia, no hoarseness Cardiovascular: no CP/SOB/palpitations/leg swelling Respiratory: no cough/SOB Gastrointestinal: no N/V/D/C Musculoskeletal: no muscle/joint aches Skin: no rashes,  + dry skin . Neurological: no tremors/numbness/tingling/dizziness Psychiatric: no depression/anxiety  Objective:    BP 132/86 mmHg  Pulse 76  Ht '5\' 4"'  (1.626 m)  Wt 199 lb (90.266 kg)  BMI 34.14 kg/m2  SpO2 97%  LMP 01/18/2016 (Exact Date)  Wt Readings from Last 3 Encounters:  01/27/16 199 lb (90.266 kg)  01/20/16 202 lb 6.4 oz (91.808 kg)  01/11/16 204 lb (92.534 kg)    Physical Exam Constitutional: overweight, in NAD Eyes: PERRLA, EOMI, no exophthalmos ENT: moist mucous membranes, no thyromegaly, no cervical lymphadenopathy Cardiovascular: RRR, No MRG Respiratory: CTA B Gastrointestinal: abdomen soft, NT, ND, BS+ Musculoskeletal: no deformities, strength intact in all 4 Skin: moist, warm, no rashes Neurological: no tremor with outstretched hands, DTR normal in all 4  Recent Results (from the past 2160 hour(s))  Basic metabolic panel     Status: None   Collection Time: 12/02/15  1:05 PM  Result Value Ref Range   Sodium 140 135 - 145 mmol/L   Potassium 4.5 3.5 - 5.1 mmol/L   Chloride 105 101 - 111 mmol/L   CO2 28 22 - 32 mmol/L   Glucose, Bld 89 65 - 99 mg/dL   BUN 20 6 - 20 mg/dL   Creatinine, Ser 0.68 0.44 - 1.00 mg/dL   Calcium 9.5 8.9 - 10.3 mg/dL   GFR calc non Af Amer >60 >60 mL/min   GFR calc Af Amer >60 >60 mL/min    Comment: (NOTE) The eGFR has been calculated using the CKD EPI equation. This calculation has not been validated in all clinical situations. eGFR's persistently <60 mL/min signify possible Chronic Kidney Disease.    Anion gap 7 5 - 15  CBC     Status: None   Collection Time: 12/02/15  1:05 PM  Result Value Ref Range   WBC 4.4 4.0 - 10.5 K/uL   RBC 3.93 3.87 - 5.11 MIL/uL   Hemoglobin 12.1 12.0 - 15.0 g/dL   HCT 37.1 36.0 - 46.0 %   MCV 94.4 78.0 - 100.0 fL   MCH 30.8 26.0 - 34.0 pg   MCHC 32.6 30.0 - 36.0 g/dL   RDW 13.2 11.5 - 15.5 %   Platelets 220 740 - 814 K/uL  Follicle stimulating hormone     Status: None   Collection Time:  01/11/16 10:20 AM  Result Value Ref Range   FSH 4.1 mIU/mL    Comment:                     Adult Female:                       Follicular phase      3.5 -  12.5                       Ovulation phase       4.7 -  21.5  Luteal phase          1.7 -   7.7                       Postmenopausal       25.8 - 134.8   TSH     Status: Abnormal   Collection Time: 01/17/16  4:07 PM  Result Value Ref Range   TSH 0.04 (L) mIU/L    Comment:   Reference Range   > or = 20 Years  0.40-4.50   Pregnancy Range First trimester  0.26-2.66 Second trimester 0.55-2.73 Third trimester  0.43-2.91     T4, free     Status: None   Collection Time: 01/17/16  4:07 PM  Result Value Ref Range   Free T4 1.3 0.8 - 1.8 ng/dL  Urine culture     Status: None   Collection Time: 01/19/16  1:20 PM  Result Value Ref Range   Urine Culture, Routine Final report    Urine Culture result 1 Comment     Comment: Culture shows less than 10,000 colony forming units of bacteria per milliliter of urine. This colony count is not generally considered to be clinically significant.   Urinalysis, Routine w reflex microscopic (not at [facility])     Status: Abnormal   Collection Time: 01/19/16  1:20 PM  Result Value Ref Range   Specific Gravity, UA      >=1.030 (A) 1.005 - 1.030   pH, UA 6.0 5.0 - 7.5   Color, UA Yellow Yellow   Appearance Ur Clear Clear   Leukocytes, UA Negative Negative   Protein, UA Trace Negative/Trace   Glucose, UA Negative Negative   Ketones, UA Negative Negative   RBC, UA 3+ (A) Negative   Bilirubin, UA Negative Negative   Urobilinogen, Ur 0.2 0.2 - 1.0 mg/dL   Nitrite, UA Negative Negative   Microscopic Examination See below:     Comment: Microscopic was indicated and was performed.  Microscopic Examination     Status: Abnormal   Collection Time: 01/19/16  1:20 PM  Result Value Ref Range   WBC, UA 0-5 0 -  5 /hpf   RBC, UA 3-10 (A) 0 -  2 /hpf   Epithelial Cells (non renal) >10  (A) 0 - 10 /hpf   Casts None seen None seen /lpf   Mucus, UA Present Not Estab.   Bacteria, UA Few None seen/Few      Assessment & Plan:   1. Hypothyroidism - Her thyroid function tests are constant with appropriate replacement. I will continue levothyroxine 137 g by mouth every morning before breakfast.   - We discussed about correct intake of levothyroxine, at fasting, with water, separated by at least 30 minutes from breakfast, and separated by more than 4 hours from calcium, iron, multivitamins, acid reflux medications (PPIs). -Patient is made aware of the fact that thyroid hormone replacement is needed for life, dose to be adjusted by periodic monitoring of thyroid function tests.  2. Pre-diabetes -Her repeat A1c is 5.8%. She will not need medications for now. She is encouraged to stay active and avoid simple/processed carbohydrates.  3. Vitamin D deficiency -This is a new diagnosis for her. I will continue vitamin D replacement 50,000 units  every 2 weeks.  4. Morbid obesity due to excess calories ([place]) -She has lost 6 lbs since last visit. I have given her detailed carbohydrate and exercise regimen. - 25 minutes of time was spent on  the care of this patient , 50% of which was applied for counseling on diabetes complications and their preventions.  - I advised patient to maintain close follow up with [facility], MD for primary care needs. Follow up plan: Return in about 6 months (around 07/28/2016) for follow up with pre-visit labs.  Glade Lloyd, MD Phone: [phone]  Fax: [phone]   01/27/2016, 2:38 PM

## 2016-02-09 ENCOUNTER — Other Ambulatory Visit (HOSPITAL_COMMUNITY): Payer: Self-pay | Admitting: Internal Medicine

## 2016-02-09 DIAGNOSIS — Z1231 Encounter for screening mammogram for malignant neoplasm of breast: Secondary | ICD-10-CM

## 2016-02-17 ENCOUNTER — Other Ambulatory Visit: Payer: Self-pay | Admitting: "Endocrinology

## 2016-02-18 ENCOUNTER — Ambulatory Visit (HOSPITAL_COMMUNITY)
Admission: RE | Admit: 2016-02-18 | Discharge: 2016-02-18 | Disposition: A | Discharge status: Home / Self Care | Payer: Medicaid Other | Source: Ambulatory Visit | Attending: Internal Medicine | Admitting: Internal Medicine

## 2016-02-18 DIAGNOSIS — Z1231 Encounter for screening mammogram for malignant neoplasm of breast: Secondary | ICD-10-CM | POA: Diagnosis not present

## 2016-03-02 ENCOUNTER — Other Ambulatory Visit (HOSPITAL_COMMUNITY): Payer: Self-pay | Admitting: Neurology

## 2016-03-02 DIAGNOSIS — H538 Other visual disturbances: Secondary | ICD-10-CM

## 2016-03-02 DIAGNOSIS — G35 Multiple sclerosis: Secondary | ICD-10-CM

## 2016-03-08 ENCOUNTER — Ambulatory Visit (INDEPENDENT_AMBULATORY_CARE_PROVIDER_SITE_OTHER): Payer: Medicaid Other | Admitting: Gastroenterology

## 2016-03-08 ENCOUNTER — Encounter: Payer: Self-pay | Admitting: Gastroenterology

## 2016-03-08 VITALS — BP 119/73 | HR 63 | Temp 98.0°F | Ht 64.0 in | Wt 204.2 lb

## 2016-03-08 DIAGNOSIS — R1310 Dysphagia, unspecified: Secondary | ICD-10-CM | POA: Diagnosis not present

## 2016-03-08 DIAGNOSIS — K625 Hemorrhage of anus and rectum: Secondary | ICD-10-CM | POA: Diagnosis not present

## 2016-03-08 MED ORDER — MESALAMINE 1000 MG RE SUPP: 1000.0000 mg | Freq: Every day | RECTAL | 1 refills | Status: DC

## 2016-03-08 NOTE — Assessment & Plan Note (Signed)
Improved with dilation. Poor dentition a contributor. Discussed chewing well, small bites. Continue Prilosec once daily. Return in 6 months.

## 2016-03-08 NOTE — Progress Notes (Signed)
Referring Provider: Rosita Fire, MD Primary Care Physician:  Rosita Fire, MD  Primary GI: Dr. Oneida Alar   Chief Complaint  Patient presents with  . Follow-up    stomach pain @ times, pain in lt leg under abd area    HPI:   Dawn Barnes is a 50 y.o. female presenting today in follow-up after EGD/colonoscopy. 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 Dysphagia likely secondary to web and/or poor dentition. Colonoscopy with mild proctitis in setting of NSAIDs.   Will sometimes notice pain in left groin that radiates down her left leg. Thinks her MS is getting worse. Bladder is getting worse. States her ablation is not lasting like it should. Fibroids hurting. Left groin pain sometimes when sitting down, sometimes when moving. Has a lot of gas. Wonders if with some things she eats.   Dysphagia improved. Still doesn't have teeth all worked on. "I only have 9, and I try to watch what I eat. Seems to be pretty good". Will take a stool softener as needed. Quit taking Ibuprofen and similar things. Now only taking Tylenol. Has seen occasional rectal bleeding.   Past Medical History:  Diagnosis Date  . Anxiety   . Constipation 04/30/2013  . Depression   . Diabetes mellitus without complication (HCC)    borderline  . Fibroids, intramural 01/21/2016  . GERD (gastroesophageal reflux disease)   . Heartburn   . Hot flashes 01/11/2016  . Mixed incontinence   . MS (multiple sclerosis) ([place])   . Pancreatitis   . PONV (postoperative nausea and vomiting)   . PTSD (post-traumatic stress disorder)   . Thyroid disease   . Urge urinary incontinence 05/10/2015  . Vaginal bleeding 01/11/2016    Past Surgical History:  Procedure Laterality Date  . BIOPSY  12/07/2015   Procedure: BIOPSY;  Surgeon: Danie Binder, MD;  [facility];  Service: Endoscopy;;  right and left colon biopsies, gastric bx's   . COLONOSCOPY WITH PROPOFOL N/A 12/07/2015   Procedure: COLONOSCOPY WITH  PROPOFOL;  Surgeon: Danie Binder, MD;  [facility];  Service: Endoscopy;  Laterality: N/A;  1215  . ENDOMETRIAL ABLATION    . ESOPHAGOGASTRODUODENOSCOPY (EGD) WITH PROPOFOL N/A 12/07/2015   Procedure: ESOPHAGOGASTRODUODENOSCOPY (EGD) WITH PROPOFOL;  Surgeon: Danie Binder, MD;  [facility];  Service: Endoscopy;  Laterality: N/A;  . INCONTINENCE SURGERY    . SAVORY DILATION N/A 12/07/2015   Procedure: SAVORY DILATION;  Surgeon: Danie Binder, MD;  [facility];  Service: Endoscopy;  Laterality: N/A;  . TUBAL LIGATION      Current Outpatient Prescriptions  Medication Sig Dispense Refill  . acetaminophen (TYLENOL) 325 MG tablet Take 650 mg by mouth every 6 (six) hours as needed.    . cetirizine (ZYRTEC) 10 MG tablet Take 10 mg by mouth daily.    . Dimethyl Fumarate (TECFIDERA) 240 MG CPDR Take 240 mg by mouth 2 (two) times daily.    Mariane Baumgarten Sodium (COLACE PO) Take by mouth as needed.    Marland Kitchen escitalopram (LEXAPRO) 20 MG tablet Take 20 mg by mouth daily.    . fesoterodine (TOVIAZ) 4 MG TB24 tablet Take 8 mg by mouth daily.     Marland Kitchen levothyroxine (SYNTHROID, LEVOTHROID) 137 MCG tablet TAKE 1 TABLET BY MOUTH DAILY BEFORE BREAKFAST. 30 tablet 4  . omeprazole (PRILOSEC) 20 MG capsule Take 40 mg by mouth daily.     Marland Kitchen UNABLE TO FIND Vit  B 12 injection-every other month    . Vitamin D, Ergocalciferol, (DRISDOL) 50000 units CAPS capsule TAKE 1 CAPSULE BY MOUTH EVERY 7 DAYS. (Patient taking differently: TAKES 1 CAPSULE EVERY OTHER WEEK) 4 capsule 2   No current facility-administered medications for this visit.     Allergies as of 03/08/2016 - Review Complete 03/08/2016  Allergen Reaction Noted  . Nsaids Other (See Comments) 01/17/2016  . Tuberculin tests Dermatitis 01/18/2015    Family History  Problem Relation Age of Onset  . Diabetes Father   . Heart disease Father   . Hypertension Paternal Aunt   . Heart disease Paternal Aunt   . Seizures Paternal Aunt   .  Heart disease Paternal Grandmother   . Depression Daughter   . Hypertension Sister   . Heart disease Sister   . Depression Son   . ADD / ADHD Son   . Depression Daughter   . Colon cancer Neg Hx     Social History   Social History  . Marital status: Legally Separated    Spouse name: N/A  . Number of children: N/A  . Years of education: N/A   Occupational History  . disability    Social History Main Topics  . Smoking status: Never Smoker  . Smokeless tobacco: Never Used  . Alcohol use No  . Drug use: No  . Sexual activity: No     Comment: tubal and ablation   Other Topics Concern  . None   Social History Narrative  . None    Review of Systems: As mentioned in HPI   Physical Exam: BP 119/73   Pulse 63   Temp 98 F (36.7 C) (Oral)   Ht 5\' 4"  (1.626 m)   Wt 204 lb 3.2 oz (92.6 kg)   BMI 35.05 kg/m  General:   Alert and oriented. No distress noted. Pleasant and cooperative.  Head:  Normocephalic and atraumatic. Eyes:  Conjuctiva clear without scleral icterus. Abdomen:  +BS, soft, non-tender and non-distended. No rebound or guarding. No HSM or masses noted. Extremities:  Without edema. Neurologic:  Alert and  oriented x4;  grossly normal neurologically. Psych:  Alert and cooperative. Normal mood and affect.

## 2016-03-08 NOTE — Assessment & Plan Note (Signed)
Colonoscopy on file. Secondary to proctitis in setting of NSAIDs. Low-volume, scant amount. Short course of [place] suppositories. Return in 6 months.

## 2016-03-08 NOTE — Patient Instructions (Addendum)
Continue Prilosec once each morning, 30 minutes before breakfast.  I have sent in suppository to take each evening per rectum just for 2 weeks.   We will see you in 6 months.

## 2016-03-08 NOTE — Progress Notes (Signed)
cc'ed to pcp °

## 2016-03-14 ENCOUNTER — Ambulatory Visit (HOSPITAL_COMMUNITY)
Admission: RE | Admit: 2016-03-14 | Discharge: 2016-03-14 | Disposition: A | Discharge status: Home / Self Care | Payer: Medicaid Other | Source: Ambulatory Visit | Attending: Neurology | Admitting: Neurology

## 2016-03-14 DIAGNOSIS — G35 Multiple sclerosis: Secondary | ICD-10-CM | POA: Insufficient documentation

## 2016-03-14 DIAGNOSIS — H538 Other visual disturbances: Secondary | ICD-10-CM | POA: Diagnosis present

## 2016-03-14 MED ORDER — GADOBENATE DIMEGLUMINE 529 MG/ML IV SOLN
19.0000 mL | Freq: Once | INTRAVENOUS | Status: AC | PRN
Start: 2016-03-14 — End: 2016-03-14
  Administered 2016-03-14: 19 mL via INTRAVENOUS

## 2016-04-20 ENCOUNTER — Ambulatory Visit (INDEPENDENT_AMBULATORY_CARE_PROVIDER_SITE_OTHER): Payer: Medicaid Other | Admitting: Orthopaedic Surgery

## 2016-04-20 ENCOUNTER — Encounter: Payer: Self-pay | Admitting: Orthopaedic Surgery

## 2016-04-20 VITALS — BP 133/78 | HR 75 | Temp 97.9°F | Ht 64.0 in | Wt 205.0 lb

## 2016-04-20 DIAGNOSIS — M25562 Pain in left knee: Secondary | ICD-10-CM | POA: Diagnosis not present

## 2016-04-20 NOTE — Progress Notes (Signed)
Patient [id number] Dawn Barnes, female DOB:1966/01/31, 50 y.o. [id number]  Chief Complaint  Patient presents with  . Knee Pain    left knee pain    HPI  Dawn Barnes is a 50 y.o. female who has chronic pain of the left knee.  She has been doing well this summer with less pain.  She has no new trauma, no giving way, no locking.  She is active.  She has some popping and some swelling. HPI  Body mass index is 35.19 kg/m.  ROS  Review of Systems  HENT: Negative for congestion.   Respiratory: Negative for cough and shortness of breath.   Cardiovascular: Negative for chest pain.  Endocrine: Positive for cold intolerance.  Musculoskeletal: Positive for arthralgias, gait problem, joint swelling and myalgias.  Allergic/Immunologic: Positive for environmental allergies.  Neurological: Negative for numbness.    Past Medical History:  Diagnosis Date  . Anxiety   . Constipation 04/30/2013  . Depression   . Diabetes mellitus without complication (HCC)    borderline  . Fibroids, intramural 01/21/2016  . GERD (gastroesophageal reflux disease)   . Heartburn   . Hot flashes 01/11/2016  . Mixed incontinence   . MS (multiple sclerosis) ([place])   . Pancreatitis   . PONV (postoperative nausea and vomiting)   . PTSD (post-traumatic stress disorder)   . Thyroid disease   . Urge urinary incontinence 05/10/2015  . Vaginal bleeding 01/11/2016    Past Surgical History:  Procedure Laterality Date  . BIOPSY  12/07/2015   Procedure: BIOPSY;  Surgeon: Danie Binder, MD;  [facility];  Service: Endoscopy;;  right and left colon biopsies, gastric bx's   . COLONOSCOPY WITH PROPOFOL N/A 12/07/2015   Dr. Oneida Alar: mild proctitis, hyperplastic polyp   . ENDOMETRIAL ABLATION    . ESOPHAGOGASTRODUODENOSCOPY (EGD) WITH PROPOFOL N/A 12/07/2015   Dr. 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   . INCONTINENCE SURGERY    . SAVORY DILATION N/A 12/07/2015    Procedure: SAVORY DILATION;  Surgeon: Danie Binder, MD;  [facility];  Service: Endoscopy;  Laterality: N/A;  . TUBAL LIGATION      Family History  Problem Relation Age of Onset  . Diabetes Father   . Heart disease Father   . Hypertension Paternal Aunt   . Heart disease Paternal Aunt   . Seizures Paternal Aunt   . Heart disease Paternal Grandmother   . Depression Daughter   . Hypertension Sister   . Heart disease Sister   . Depression Son   . ADD / ADHD Son   . Depression Daughter   . Colon cancer Neg Hx     Social History Social History  Substance Use Topics  . Smoking status: Never Smoker  . Smokeless tobacco: Never Used  . Alcohol use No    Allergies  Allergen Reactions  . Nsaids Other (See Comments)    Pt states MD told her not to take due to stomach irritation  . Tuberculin Tests Dermatitis    Current Outpatient Prescriptions  Medication Sig Dispense Refill  . acetaminophen (TYLENOL) 325 MG tablet Take 650 mg by mouth every 6 (six) hours as needed.    . cetirizine (ZYRTEC) 10 MG tablet Take 10 mg by mouth daily.    . Dimethyl Fumarate (TECFIDERA) 240 MG CPDR Take 240 mg by mouth 2 (two) times daily.    Mariane Baumgarten Sodium (COLACE PO) Take by mouth as needed.    Marland Kitchen  escitalopram (LEXAPRO) 20 MG tablet Take 20 mg by mouth daily.    . fesoterodine (TOVIAZ) 4 MG TB24 tablet Take 8 mg by mouth daily.     Marland Kitchen levothyroxine (SYNTHROID, LEVOTHROID) 137 MCG tablet TAKE 1 TABLET BY MOUTH DAILY BEFORE BREAKFAST. 30 tablet 4  . omeprazole (PRILOSEC) 20 MG capsule Take 40 mg by mouth daily.     Marland Kitchen UNABLE TO FIND Vit B 12 injection-every other month    . Vitamin D, Ergocalciferol, (DRISDOL) 50000 units CAPS capsule TAKE 1 CAPSULE BY MOUTH EVERY 7 DAYS. (Patient taking differently: TAKES 1 CAPSULE EVERY OTHER WEEK) 4 capsule 2  . mesalamine (CANASA) 1000 MG suppository Place 1 suppository (1,000 mg total) rectally at bedtime. 14 suppository 1   No current  facility-administered medications for this visit.      Physical Exam  Blood pressure 133/78, pulse 75, temperature 97.9 F (36.6 C), height 5\' 4"  (1.626 m), weight 205 lb (93 kg).  Constitutional: overall normal hygiene, normal nutrition, well developed, normal grooming, normal body habitus. Assistive device:none  Musculoskeletal: gait and station Limp left, muscle tone and strength are normal, no tremors or atrophy is present.  .  Neurological: coordination overall normal.  Deep tendon reflex/nerve stretch intact.  Sensation normal.  Cranial nerves II-XII intact.   Skin:   Normal overall no scars, lesions, ulcers or rashes. No psoriasis.  Psychiatric: Alert and oriented x 3.  Recent memory intact, remote memory unclear.  Normal mood and affect. Well groomed.  Good eye contact.  Cardiovascular: overall no swelling, no varicosities, no edema bilaterally, normal temperatures of the legs and arms, no clubbing, cyanosis and good capillary refill.  Lymphatic: palpation is normal.  The left lower extremity is examined:  Inspection:  Thigh:  Non-tender and no defects  Knee does not have swelling 0 effusion.                        Joint tenderness is present                        Patient is tender over the medial joint line  Lower Leg:  Has normal appearance and no tenderness or defects  Ankle:  Non-tender and no defects  Foot:  Non-tender and no defects Range of Motion:  Knee:  Range of motion is: 0-110                        Crepitus is  present  Ankle:  Range of motion is normal. Strength and Tone:  The left lower extremity has normal strength and tone. Stability:  Knee:  The knee is stable.  Ankle:  The ankle is stable.    The patient has been educated about the nature of the problem(s) and counseled on treatment options.  The patient appeared to understand what I have discussed and is in agreement with it.  Encounter Diagnosis  Name Primary?  . Left knee pain Yes     PLAN Call if any problems.  Precautions discussed.  Continue current medications.   Return to clinic 3 months   Electronically Signed Sanjuana Kava, MD 9/21/20172:17 PM

## 2016-05-15 ENCOUNTER — Other Ambulatory Visit (HOSPITAL_COMMUNITY)
Admission: RE | Admit: 2016-05-15 | Discharge: 2016-05-15 | Disposition: A | Discharge status: Home / Self Care | Payer: Medicaid Other | Source: Ambulatory Visit | Attending: Adult Health | Admitting: Adult Health

## 2016-05-15 ENCOUNTER — Encounter: Payer: Self-pay | Admitting: Adult Health

## 2016-05-15 ENCOUNTER — Ambulatory Visit (INDEPENDENT_AMBULATORY_CARE_PROVIDER_SITE_OTHER): Payer: Medicaid Other | Admitting: Adult Health

## 2016-05-15 VITALS — BP 110/72 | HR 64 | Ht 64.0 in | Wt 207.0 lb

## 2016-05-15 DIAGNOSIS — R232 Flushing: Secondary | ICD-10-CM

## 2016-05-15 DIAGNOSIS — Z01419 Encounter for gynecological examination (general) (routine) without abnormal findings: Secondary | ICD-10-CM | POA: Diagnosis present

## 2016-05-15 DIAGNOSIS — F3289 Other specified depressive episodes: Secondary | ICD-10-CM

## 2016-05-15 DIAGNOSIS — Z0000 Encounter for general adult medical examination without abnormal findings: Secondary | ICD-10-CM | POA: Diagnosis not present

## 2016-05-15 DIAGNOSIS — Z1212 Encounter for screening for malignant neoplasm of rectum: Secondary | ICD-10-CM

## 2016-05-15 DIAGNOSIS — Z1151 Encounter for screening for human papillomavirus (HPV): Secondary | ICD-10-CM | POA: Diagnosis not present

## 2016-05-15 DIAGNOSIS — R300 Dysuria: Secondary | ICD-10-CM | POA: Diagnosis not present

## 2016-05-15 DIAGNOSIS — Z8659 Personal history of other mental and behavioral disorders: Secondary | ICD-10-CM | POA: Insufficient documentation

## 2016-05-15 DIAGNOSIS — Z124 Encounter for screening for malignant neoplasm of cervix: Secondary | ICD-10-CM

## 2016-05-15 LAB — POCT URINALYSIS DIPSTICK
Glucose, UA: NEGATIVE
Ketones, UA: NEGATIVE
LEUKOCYTES UA: NEGATIVE
NITRITE UA: NEGATIVE

## 2016-05-15 LAB — HEMOCCULT GUIAC POC 1CARD (OFFICE): FECAL OCCULT BLD: NEGATIVE

## 2016-05-15 MED ORDER — SULFAMETHOXAZOLE-TRIMETHOPRIM 800-160 MG PO TABS: 1.0000 | ORAL_TABLET | Freq: Two times a day (BID) | ORAL | 0 refills | Status: DC

## 2016-05-15 NOTE — Progress Notes (Signed)
Patient ID: Dawn Barnes, female   DOB: May 31, 1966, 50 y.o.   MRN: [id number] History of Present Illness: Dawn Barnes is a 50 year old white female, divorced, in for well woman gyn exam and pap.She is complaining of burning with urination and pain right lower back x 1 day, she has hot flashes some and is constipated on and off, she has meds.She has 9 teeth left and is having them pulled and getting dentures in near future. PCP is Dr Legrand Rams.    Current Medications, Allergies, Past Medical History, Past Surgical History, Family History and Social History were reviewed in [employer] record.     Review of Systems: Patient denies any headaches, hearing loss, fatigue, blurred vision, shortness of breath, chest pain, abdominal pain, problems with or intercourse(not having sex). No joint pain or mood swings.She is sp ablation, and spots occasionally.  See HPI for positives.   Physical Exam:BP 110/72 (BP Location: Left Arm, Patient Position: Sitting, Cuff Size: Large)   Pulse 64   Ht 5\' 4"  (1.626 m)   Wt 207 lb (93.9 kg)   BMI 35.53 kg/m Urine dipstick trace protein and trace blood General:  Well developed, well nourished, no acute distress Skin:  Warm and dry Neck:  Midline trachea, normal thyroid, good ROM, no lymphadenopathy Lungs; Clear to auscultation bilaterally Breast:  No dominant palpable mass, retraction, or nipple discharge Cardiovascular: Regular rate and rhythm Abdomen:  Soft, non tender, no hepatosplenomegaly,No CVAT Pelvic:  External genitalia is normal in appearance, no lesions.  The vagina is normal in appearance. Urethra has no lesions or masses. The cervix is bulbous.Pap with HPV performed.  Uterus is felt to be normal size, shape, and contour.  No adnexal masses or tenderness noted.Bladder is non tender, no masses felt. Rectal: Good sphincter tone, no polyps, or hemorrhoids felt.  Hemoccult negative. Extremities/musculoskeletal:  No swelling or varicosities  noted, no clubbing or cyanosis Psych:  No mood changes, alert and cooperative,seems happy PHQ 9 score 6, is on meds and sees [facility] and also Dr Marciano Sequin has depression and history of PTSD.  Impression: 1. Encounter for routine gynecological examination with Papanicolaou smear of cervix   2. Routine cervical smear   3. Burning with urination   4. Hot flashes   5. Other depression   6. History of posttraumatic stress disorder (PTSD)       Plan: Rx septra ds #14 take 1 bid, and push fluids, if not better when finished with meds call  Physical in 1 year, pap in 3 if normal Mammogram yearly  Labs with Dr Legrand Rams and Dr Dorris Fetch Colonoscopy per Dr Oneida Alar  Get flu shot 10/31 with Dr Legrand Rams Follow up with Dr Hoyle Barr at [facility] and [facility]

## 2016-05-15 NOTE — Patient Instructions (Addendum)
Physical in 1 year, pap in 3 if normal Mammogram yearly  Labs with Dr Legrand Rams and Dr Dorris Fetch Colonoscopy per Dr Oneida Alar  Get flu shot 10/31 with Dr Legrand Rams Follow up with Dr Hoyle Barr at [facility] and [facility]

## 2016-05-16 LAB — CYTOLOGY - PAP
DIAGNOSIS: NEGATIVE
HPV (WINDOPATH): NOT DETECTED

## 2016-05-22 ENCOUNTER — Telehealth (HOSPITAL_COMMUNITY): Payer: Self-pay | Admitting: Physical Therapy

## 2016-05-22 NOTE — Telephone Encounter (Signed)
Called pt back she rescheduled to come to our office for PT. NF 05/22/16

## 2016-05-23 ENCOUNTER — Ambulatory Visit (HOSPITAL_COMMUNITY): Payer: Medicaid Other

## 2016-05-29 ENCOUNTER — Encounter (HOSPITAL_COMMUNITY): Payer: Self-pay | Admitting: Physical Therapy

## 2016-05-29 ENCOUNTER — Ambulatory Visit (HOSPITAL_COMMUNITY): Payer: Medicaid Other | Attending: Neurology | Admitting: Physical Therapy

## 2016-05-29 DIAGNOSIS — R293 Abnormal posture: Secondary | ICD-10-CM | POA: Insufficient documentation

## 2016-05-29 DIAGNOSIS — M6281 Muscle weakness (generalized): Secondary | ICD-10-CM | POA: Diagnosis present

## 2016-05-29 DIAGNOSIS — M545 Low back pain: Secondary | ICD-10-CM | POA: Insufficient documentation

## 2016-05-29 DIAGNOSIS — G8929 Other chronic pain: Secondary | ICD-10-CM | POA: Diagnosis present

## 2016-05-29 NOTE — Therapy (Signed)
[facility] [place], [place], [address] Phone: [phone]   Fax:  [phone]  Physical Therapy Evaluation/Discharge  Patient Details  Name: Dawn Barnes MRN: [id number] Date of Birth: [birth year] Referring Provider: Barton Fanny, NP  Encounter Date: 05/29/2016      PT End of Session - 05/29/16 1244    Visit Number 1   Number of Visits 1   Authorization Type Medicaid   Authorization Time Period 05/29/16   PT Start Time 1032   PT Stop Time 1114   PT Time Calculation (min) 42 min   Activity Tolerance Patient tolerated treatment well   Behavior During Therapy [facility] [place] for tasks assessed/performed      Past Medical History:  Diagnosis Date  . Anxiety   . Constipation 04/30/2013  . Depression   . Diabetes mellitus without complication (HCC)    borderline  . Fibroids, intramural 01/21/2016  . GERD (gastroesophageal reflux disease)   . Heartburn   . Hot flashes 01/11/2016  . Mixed incontinence   . MS (multiple sclerosis) ([place])   . Pancreatitis   . PONV (postoperative nausea and vomiting)   . PTSD (post-traumatic stress disorder)   . Thyroid disease   . Urge urinary incontinence 05/10/2015  . Vaginal bleeding 01/11/2016    Past Surgical History:  Procedure Laterality Date  . BIOPSY  12/07/2015   Procedure: BIOPSY;  Surgeon: Danie Binder, MD;  [facility];  Service: Endoscopy;;  right and left colon biopsies, gastric bx's   . COLONOSCOPY WITH PROPOFOL N/A 12/07/2015   Dr. Oneida Alar: mild proctitis, hyperplastic polyp   . ENDOMETRIAL ABLATION    . ESOPHAGOGASTRODUODENOSCOPY (EGD) WITH PROPOFOL N/A 12/07/2015   Dr. 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   . INCONTINENCE SURGERY    . SAVORY DILATION N/A 12/07/2015   Procedure: SAVORY DILATION;  Surgeon: Danie Binder, MD;  [facility];  Service: Endoscopy;  Laterality: N/A;  . TUBAL LIGATION       There were no vitals filed for this visit.       Subjective Assessment - 05/29/16 1038    Subjective Pt reports back pain that has been going on for "a good while" on and off as well occasional issues with balance.    Pertinent History Anxiety, MS, depression, GERD, PTSD   Limitations House hold activities;Other (comment)  dishes, cleaning; stepping over things    How long can you sit comfortably? unlimited    How long can you stand comfortably? 10 minutes   How long can you walk comfortably? a little longer but slow    Patient Stated Goals to feel better    Currently in Pain? Yes   Pain Orientation Mid;Lower   Pain Descriptors / Indicators Burning   Pain Type Chronic pain   Pain Radiating Towards none; numbness along B hips (Rt>Lt)   Pain Onset More than a month ago   Pain Frequency Once a week   Aggravating Factors  standing for a long time, sitting in the floor or sitting with bad posture    Pain Relieving Factors laying down or sitting up right    Effect of Pain on Daily Activities unsure             [facility] PT Assessment - 05/29/16 0001      Assessment   Medical Diagnosis LBP- gait and balance abnormalities due to MS   Referring Provider Barton Fanny,  NP   Onset Date/Surgical Date --  unsure    Next MD Visit 06/29/16   Prior Therapy 1 visit for LBP     Balance Screen   Has the patient fallen in the past 6 months No   Has the patient had a decrease in activity level because of a fear of falling?  Yes   Is the patient reluctant to leave their home because of a fear of falling?  No     Home Ecologist residence   Living Arrangements Children   Additional Comments 4 STE     Prior Function   Level of Independence Independent     Cognition   Overall Cognitive Status Within Functional Limits for tasks assessed     Sensation   Additional Comments N/T reported along Rt posterior hip, thigh     ROM / Strength   AROM / PROM /  Strength Strength     Strength   Strength Assessment Site Hip;Knee;Ankle   Right/Left Hip Right;Left   Right Hip Flexion 3+/5   Right Hip Extension 3/5   Right Hip ABduction 3+/5   Left Hip Flexion 3+/5   Left Hip Extension 3/5   Left Hip ABduction 4-/5   Right/Left Knee Right;Left   Right Knee Flexion 4-/5   Right Knee Extension 4+/5   Left Knee Flexion 4-/5   Left Knee Extension 4+/5   Right/Left Ankle Right;Left   Right Ankle Dorsiflexion 5/5   Left Ankle Dorsiflexion 5/5     Transfers   Five time sit to stand comments  16.75, no UE      Standardized Balance Assessment   Standardized Balance Assessment Timed Up and Go Test;Berg Balance Test     Berg Balance Test   Sit to Stand Able to stand without using hands and stabilize independently   Standing Unsupported Able to stand safely 2 minutes   Sitting with Back Unsupported but Feet Supported on Floor or Stool Able to sit safely and securely 2 minutes   Stand to Sit Sits safely with minimal use of hands   Transfers Able to transfer safely, minor use of hands   Standing Unsupported with Eyes Closed Able to stand 10 seconds safely   Standing Ubsupported with Feet Together Able to place feet together independently and stand 1 minute safely   From Standing, Reach Forward with Outstretched Arm Can reach confidently >25 cm (10")   From Standing Position, Pick up Object from Floor Able to pick up shoe safely and easily   From Standing Position, Turn to Look Behind Over each Shoulder Looks behind from both sides and weight shifts well   Turn 360 Degrees Able to turn 360 degrees safely in 4 seconds or less   Standing Unsupported, Alternately Place Feet on Step/Stool Able to stand independently and safely and complete 8 steps in 20 seconds   Standing Unsupported, One Foot in Front Able to place foot tandem independently and hold 30 seconds   Standing on One Leg Able to lift leg independently and hold > 10 seconds  20 sec    Total  Score 56     Timed Up and Go Test   TUG Comments 10.5 sec                   [facility] Adult PT Treatment/Exercise - 05/29/16 0001      Exercises   Exercises Knee/Hip     Knee/Hip Exercises: Stretches   Hip Flexor  Stretch 1 rep;30 seconds;Both     Knee/Hip Exercises: Seated   Other Seated Knee/Hip Exercises trunk rotation stretch 3x10 sec each   Other Seated Knee/Hip Exercises lumbar roll for improved sitting posture      Knee/Hip Exercises: Supine   Bridges 1 set;15 reps                PT Education - 05/29/16 1656    Education provided Yes   Education Details eval findings/recommended POC; detail HEP review; importance of upright posture and improved awareness to decrease LBP/fatigue during activity; use of lumbar roll to address sitting posture   Person(s) Educated Patient   Methods Demonstration;Handout;Explanation;Verbal cues   Comprehension Returned demonstration;Verbalized understanding          PT Short Term Goals - 05/29/16 1259      PT SHORT TERM GOAL #1   Title Pt will verbalize and demonstrate understanding of HEP to improve strength and posture.    Time 1   Period Days   Status New           PT Long Term Goals - 05/29/16 1657      PT LONG TERM GOAL #1   Title N/A due to 1 time visit                Plan - 05/29/16 1245    Clinical Impression Statement Pt is a 50yo F referred to OPPT concerning LBP as well as gait/balance disturbances secondary to MS. She presents with postural abnormalities as well as LE strength and flexibility deficits which is leading to muscle fatigue in her low back during prolonged activity. She denies any loss of bowel/bladder function and reports ongoing numbness along her Rt posterior thigh which is reportedly constant and unchanged with movements. She scored 56/56 on the Berg balance test, and less than 11 sec on the TUG, placing her at a low risk of falling at home or in the community, however I  reviewed ways to decrease risk of falls at home by removing floor rugs/clutter, etc. At this time, she feels she cannot afford her sessions and feels comfortable performing her HEP at home on her own. I discussed ways to adjust her posture and encouraged increased awareness to decrease her pain at home. Also reviewed therex to address LE weakness and pt was able to return proper demonstration. She is being discharged from PT at this time due to financial strains and her request to perform HEP at home on her own.    Rehab Potential Fair   Clinical Impairments Affecting Rehab Potential limited number of visits    PT Frequency Other (comment)  due to financial restrictions    PT Treatment/Interventions ADLs/Self Care Home Management;Therapeutic exercise;Therapeutic activities;Patient/family education;Passive range of motion   PT Next Visit Plan no follow up at this time    PT Home Exercise Plan SKTC 5x10 sec; seated trunk rotation stretch 5x10sec each; supine lower trunk rotation; bridge x15 reps; hip flexor stretch 2x30 sec; standing hip flexion x15 each; lumbar roll set up    Recommended Other Services none    Consulted and Agree with Plan of Care Patient      Patient will benefit from skilled therapeutic intervention in order to improve the following deficits and impairments:  Decreased activity tolerance, Pain, Postural dysfunction, Improper body mechanics, Decreased strength  Visit Diagnosis: Chronic bilateral low back pain, with sciatica presence unspecified  Muscle weakness (generalized)  Abnormal posture     Problem List Patient Active  Problem List   Diagnosis Date Noted  . History of posttraumatic stress disorder (PTSD) 05/15/2016  . Burning with urination 05/15/2016  . Encounter for routine gynecological examination with Papanicolaou smear of cervix 05/15/2016  . Fibroids, intramural 01/21/2016  . Vaginal bleeding 01/11/2016  . Hot flashes 01/11/2016  . Rectal bleeding  11/03/2015  . Dysphagia 11/03/2015  . Pre-diabetes 10/25/2015  . Vitamin D deficiency 10/25/2015  . Obesity (BMI 30-39.9) 10/25/2015  . Urge urinary incontinence 05/10/2015  . Radicular low back pain 10/23/2013  . Depression 04/30/2013  . Hypothyroidism 04/30/2013  . Constipation 04/30/2013   5:00 PM,05/29/16 Elly Modena PT, DPT [facility] [place] [address] [place], [place], [address] Phone: [phone]   Fax:  [phone]  Name: Dawn Barnes MRN: [id number] Date of Birth: 1966-05-04

## 2016-06-15 ENCOUNTER — Encounter: Payer: Self-pay | Admitting: "Endocrinology

## 2016-06-28 ENCOUNTER — Other Ambulatory Visit: Payer: Self-pay | Admitting: "Endocrinology

## 2016-07-19 ENCOUNTER — Ambulatory Visit: Payer: Medicaid Other | Admitting: Orthopaedic Surgery

## 2016-07-20 ENCOUNTER — Ambulatory Visit: Payer: Medicaid Other | Admitting: Orthopaedic Surgery

## 2016-07-21 ENCOUNTER — Other Ambulatory Visit: Payer: Self-pay | Admitting: "Endocrinology

## 2016-07-22 LAB — T4, FREE: Free T4: 1.4 ng/dL (ref 0.8–1.8)

## 2016-07-22 LAB — T3, FREE: T3, Free: 3.1 pg/mL (ref 2.3–4.2)

## 2016-07-28 ENCOUNTER — Ambulatory Visit: Payer: Medicaid Other | Admitting: "Endocrinology

## 2016-08-01 ENCOUNTER — Encounter: Payer: Self-pay | Admitting: "Endocrinology

## 2016-08-01 ENCOUNTER — Ambulatory Visit (INDEPENDENT_AMBULATORY_CARE_PROVIDER_SITE_OTHER): Payer: Medicaid Other | Admitting: "Endocrinology

## 2016-08-01 VITALS — BP 132/85 | HR 71 | Ht 64.0 in | Wt 204.0 lb

## 2016-08-01 DIAGNOSIS — E038 Other specified hypothyroidism: Secondary | ICD-10-CM

## 2016-08-01 DIAGNOSIS — R7303 Prediabetes: Secondary | ICD-10-CM | POA: Diagnosis not present

## 2016-08-01 MED ORDER — LEVOTHYROXINE SODIUM 137 MCG PO TABS
ORAL_TABLET | ORAL | 6 refills | Status: DC
Start: 2016-08-01 — End: 2017-03-05

## 2016-08-01 NOTE — Progress Notes (Signed)
Subjective:    Patient ID: Dawn Barnes, female    DOB: 07/11/1966, PCP Rosita Fire, MD   Past Medical History:  Diagnosis Date  . Anxiety   . Constipation 04/30/2013  . Depression   . Diabetes mellitus without complication (HCC)    borderline  . Fibroids, intramural 01/21/2016  . GERD (gastroesophageal reflux disease)   . Heartburn   . Hot flashes 01/11/2016  . Mixed incontinence   . MS (multiple sclerosis) ([place])   . Pancreatitis   . PONV (postoperative nausea and vomiting)   . PTSD (post-traumatic stress disorder)   . Thyroid disease   . Urge urinary incontinence 05/10/2015  . Vaginal bleeding 01/11/2016   Past Surgical History:  Procedure Laterality Date  . BIOPSY  12/07/2015   Procedure: BIOPSY;  Surgeon: Danie Binder, MD;  [facility];  Service: Endoscopy;;  right and left colon biopsies, gastric bx's   . COLONOSCOPY WITH PROPOFOL N/A 12/07/2015   Dr. Oneida Alar: mild proctitis, hyperplastic polyp   . ENDOMETRIAL ABLATION    . ESOPHAGOGASTRODUODENOSCOPY (EGD) WITH PROPOFOL N/A 12/07/2015   Dr. 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   . INCONTINENCE SURGERY    . SAVORY DILATION N/A 12/07/2015   Procedure: SAVORY DILATION;  Surgeon: Danie Binder, MD;  [facility];  Service: Endoscopy;  Laterality: N/A;  . TUBAL LIGATION     Social History   Social History  . Marital status: Legally Separated    Spouse name: N/A  . Number of children: N/A  . Years of education: N/A   Occupational History  . disability    Social History Main Topics  . Smoking status: Never Smoker  . Smokeless tobacco: Never Used  . Alcohol use No  . Drug use: No  . Sexual activity: No     Comment: tubal and ablation   Other Topics Concern  . None   Social History Narrative  . None   Outpatient Encounter Prescriptions as of 08/01/2016  Medication Sig  . acetaminophen (TYLENOL) 325 MG tablet Take 650 mg by mouth  every 6 (six) hours as needed.  . cetirizine (ZYRTEC) 10 MG tablet Take 10 mg by mouth daily.  . Dimethyl Fumarate (TECFIDERA) 240 MG CPDR Take 240 mg by mouth 2 (two) times daily.  Mariane Baumgarten Sodium (COLACE PO) Take by mouth as needed.  Marland Kitchen escitalopram (LEXAPRO) 20 MG tablet Take 20 mg by mouth daily.  . fesoterodine (TOVIAZ) 4 MG TB24 tablet Take 8 mg by mouth daily.   Marland Kitchen levothyroxine (SYNTHROID, LEVOTHROID) 137 MCG tablet TAKE 1 TABLET BY MOUTH DAILY BEFORE BREAKFAST.  Marland Kitchen mesalamine (CANASA) 1000 MG suppository Place 1 suppository (1,000 mg total) rectally at bedtime.  Marland Kitchen omeprazole (PRILOSEC) 20 MG capsule Take 40 mg by mouth daily.   Marland Kitchen sulfamethoxazole-trimethoprim (BACTRIM DS,SEPTRA DS) 800-160 MG tablet Take 1 tablet by mouth 2 (two) times daily.  Marland Kitchen UNABLE TO FIND Vit B 12 injection-every other month  . Vitamin D, Ergocalciferol, (DRISDOL) 50000 units CAPS capsule TAKE 1 CAPSULE BY MOUTH EVERY 7 DAYS.  . [DISCONTINUED] levothyroxine (SYNTHROID, LEVOTHROID) 137 MCG tablet TAKE 1 TABLET BY MOUTH DAILY BEFORE BREAKFAST.   No facility-administered encounter medications on file as of 08/01/2016.    ALLERGIES: Allergies  Allergen Reactions  . Nsaids Other (See Comments)    Pt states MD told her not to take due to stomach irritation  . Tuberculin Tests Dermatitis   VACCINATION  STATUS: Immunization History  Administered Date(s) Administered  . Influenza-Unspecified 04/30/2014, 03/15/2015    HPI  51 year old female patient with medical history of hypothyroidism from Hashimoto's thyroiditis. She is on levothyroxine 137 g by mouth every morning. She has reasonable compliance. She denies heat/cold complaint, still complains of fatigue, and dry skin.  She also has history of prediabetes on exercise and diet.   Review of Systems   Constitutional:  + fatigue, no subjective hyperthermia/hypothermia Eyes: no blurry vision, no xerophthalmia ENT: no sore throat, no nodules palpated in throat,  no dysphagia/odynophagia, no hoarseness Cardiovascular: no CP/SOB/palpitations/leg swelling Respiratory: no cough/SOB Gastrointestinal: no N/V/D/C Musculoskeletal: no muscle/joint aches Skin: no rashes, + dry skin . Neurological: no tremors/numbness/tingling/dizziness Psychiatric: no depression/anxiety  Objective:    BP 132/85   Pulse 71   Ht 5\' 4"  (1.626 m)   Wt 204 lb (92.5 kg)   BMI 35.02 kg/m   Wt Readings from Last 3 Encounters:  08/01/16 204 lb (92.5 kg)  05/15/16 207 lb (93.9 kg)  04/20/16 205 lb (93 kg)    Physical Exam Constitutional: overweight, in NAD Eyes: PERRLA, EOMI, no exophthalmos ENT: moist mucous membranes, no thyromegaly, no cervical lymphadenopathy Cardiovascular: RRR, No MRG Respiratory: CTA B Gastrointestinal: abdomen soft, NT, ND, BS+ Musculoskeletal: no deformities, strength intact in all 4 Skin: moist, warm, no rashes Neurological: no tremor with outstretched hands, DTR normal in all 4  Recent Results (from the past 2160 hour(s))  Cytology - PAP     Status: None   Collection Time: 05/15/16 12:00 AM  Result Value Ref Range   Adequacy      Satisfactory for evaluation  endocervical/transformation zone component PRESENT.   Diagnosis      NEGATIVE FOR INTRAEPITHELIAL LESIONS OR MALIGNANCY.   HPV NOT DETECTED     Comment: Normal Reference Range - NOT Detected   Material Submitted CervicoVaginal Pap [ThinPrep Imaged]    CYTOLOGY - PAP PAP RESULT   POCT Urinalysis Dipstick     Status: None   Collection Time: 05/15/16  9:53 AM  Result Value Ref Range   Color, UA     Clarity, UA     Glucose, UA neg    Bilirubin, UA     Ketones, UA neg    Spec Grav, UA     Blood, UA trace    pH, UA     Protein, UA trace    Urobilinogen, UA     Nitrite, UA neg    Leukocytes, UA Negative Negative  POCT occult blood stool     Status: None   Collection Time: 05/15/16 10:04 AM  Result Value Ref Range   Fecal Occult Blood, POC Negative Negative   Card #1 Date      Card #2 Fecal Occult Blod, POC     Card #2 Date     Card #3 Fecal Occult Blood, POC     Card #3 Date    T4, free     Status: None   Collection Time: 07/21/16  3:30 PM  Result Value Ref Range   Free T4 1.4 0.8 - 1.8 ng/dL  T3, free     Status: None   Collection Time: 07/21/16  3:30 PM  Result Value Ref Range   T3, Free 3.1 2.3 - 4.2 pg/mL      Assessment & Plan:   1. Hypothyroidism - Her thyroid function tests are constant with appropriate replacement. I will continue levothyroxine 137 g by mouth every morning before breakfast.   -  We discussed about correct intake of levothyroxine, at fasting, with water, separated by at least 30 minutes from breakfast, and separated by more than 4 hours from calcium, iron, multivitamins, acid reflux medications (PPIs). -Patient is made aware of the fact that thyroid hormone replacement is needed for life, dose to be adjusted by periodic monitoring of thyroid function tests.  2. Pre-diabetes -Her last  A1c was 5.8%. She will not need medications for now. She is encouraged to stay active and avoid simple/processed carbohydrates.  3. Vitamin D deficiency -This is a new diagnosis for her. I will continue vitamin D replacement 50,000 units  every 2 weeks.  4.  obesity due to excess calories ([place])  I have given her detailed carbohydrate and exercise regimen. - 25 minutes of time was spent on the care of this patient , 50% of which was applied for counseling on diabetes complications and their preventions.  - I advised patient to maintain close follow up with [facility], MD for primary care needs. Follow up plan: Return in about 6 months (around 01/29/2017) for follow up with pre-visit labs.  Glade Lloyd, MD Phone: [phone]  Fax: [phone]   08/01/2016, 2:21 PM

## 2016-08-03 ENCOUNTER — Ambulatory Visit (INDEPENDENT_AMBULATORY_CARE_PROVIDER_SITE_OTHER): Payer: Medicaid Other | Admitting: Orthopaedic Surgery

## 2016-08-03 ENCOUNTER — Encounter: Payer: Self-pay | Admitting: Orthopaedic Surgery

## 2016-08-03 VITALS — BP 126/76 | HR 82 | Temp 97.3°F | Ht 64.0 in | Wt 202.0 lb

## 2016-08-03 DIAGNOSIS — M25562 Pain in left knee: Secondary | ICD-10-CM

## 2016-08-03 DIAGNOSIS — G8929 Other chronic pain: Secondary | ICD-10-CM | POA: Diagnosis not present

## 2016-08-03 NOTE — Progress Notes (Signed)
Patient MP:[id number] Dawn Barnes, female DOB:1965-11-22, 51 y.o. [id number]  Chief Complaint  Patient presents with  . Follow-up    left knee pain    HPI  Dawn Barnes is a 51 y.o. female who has left knee pain chronically.  She has more pain in cold weather like we have now.  She has no new trauma. She has no redness.  She has no giving way.  She is active and taking her medicine. HPI  Body mass index is 34.67 kg/m.  ROS  Review of Systems  HENT: Negative for congestion.   Respiratory: Negative for cough and shortness of breath.   Cardiovascular: Negative for chest pain.  Endocrine: Positive for cold intolerance.  Musculoskeletal: Positive for arthralgias, gait problem, joint swelling and myalgias.  Allergic/Immunologic: Positive for environmental allergies.  Neurological: Negative for numbness.    Past Medical History:  Diagnosis Date  . Anxiety   . Constipation 04/30/2013  . Depression   . Diabetes mellitus without complication (HCC)    borderline  . Fibroids, intramural 01/21/2016  . GERD (gastroesophageal reflux disease)   . Heartburn   . Hot flashes 01/11/2016  . Mixed incontinence   . MS (multiple sclerosis) ([place])   . Pancreatitis   . PONV (postoperative nausea and vomiting)   . PTSD (post-traumatic stress disorder)   . Thyroid disease   . Urge urinary incontinence 05/10/2015  . Vaginal bleeding 01/11/2016    Past Surgical History:  Procedure Laterality Date  . BIOPSY  12/07/2015   Procedure: BIOPSY;  Surgeon: Danie Binder, MD;  [facility];  Service: Endoscopy;;  right and left colon biopsies, gastric bx's   . COLONOSCOPY WITH PROPOFOL N/A 12/07/2015   Dr. Oneida Alar: mild proctitis, hyperplastic polyp   . ENDOMETRIAL ABLATION    . ESOPHAGOGASTRODUODENOSCOPY (EGD) WITH PROPOFOL N/A 12/07/2015   Dr. 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   . INCONTINENCE SURGERY    . SAVORY DILATION N/A 12/07/2015   Procedure: SAVORY DILATION;  Surgeon: Danie Binder, MD;  [facility];  Service: Endoscopy;  Laterality: N/A;  . TUBAL LIGATION      Family History  Problem Relation Age of Onset  . Diabetes Father   . Heart disease Father   . Hypertension Paternal Aunt   . Heart disease Paternal Aunt   . Seizures Paternal Aunt   . Breast cancer Paternal Aunt   . Heart disease Paternal Grandmother   . Depression Daughter   . Hypertension Sister   . Heart disease Sister   . Depression Son   . ADD / ADHD Son   . Depression Daughter   . Colon cancer Neg Hx     Social History Social History  Substance Use Topics  . Smoking status: Never Smoker  . Smokeless tobacco: Never Used  . Alcohol use No    Allergies  Allergen Reactions  . Nsaids Other (See Comments)    Pt states MD told her not to take due to stomach irritation  . Tuberculin Tests Dermatitis    Current Outpatient Prescriptions  Medication Sig Dispense Refill  . acetaminophen (TYLENOL) 325 MG tablet Take 650 mg by mouth every 6 (six) hours as needed.    . cetirizine (ZYRTEC) 10 MG tablet Take 10 mg by mouth daily.    . Dimethyl Fumarate (TECFIDERA) 240 MG CPDR Take 240 mg by mouth 2 (two) times daily.    Mariane Baumgarten Sodium (COLACE PO) Take  by mouth as needed.    Marland Kitchen escitalopram (LEXAPRO) 20 MG tablet Take 20 mg by mouth daily.    . fesoterodine (TOVIAZ) 4 MG TB24 tablet Take 8 mg by mouth daily.     Marland Kitchen levothyroxine (SYNTHROID, LEVOTHROID) 137 MCG tablet TAKE 1 TABLET BY MOUTH DAILY BEFORE BREAKFAST. 30 tablet 6  . mesalamine (CANASA) 1000 MG suppository Place 1 suppository (1,000 mg total) rectally at bedtime. 14 suppository 1  . omeprazole (PRILOSEC) 20 MG capsule Take 40 mg by mouth daily.     Marland Kitchen sulfamethoxazole-trimethoprim (BACTRIM DS,SEPTRA DS) 800-160 MG tablet Take 1 tablet by mouth 2 (two) times daily. 14 tablet 0  . UNABLE TO FIND Vit B 12 injection-every other month    . Vitamin D, Ergocalciferol, (DRISDOL)  50000 units CAPS capsule TAKE 1 CAPSULE BY MOUTH EVERY 7 DAYS. 4 capsule 2   No current facility-administered medications for this visit.      Physical Exam  Blood pressure 126/76, pulse 82, temperature 97.3 F (36.3 C), height 5\' 4"  (1.626 m), weight 202 lb (91.6 kg).  Constitutional: overall normal hygiene, normal nutrition, well developed, normal grooming, normal body habitus. Assistive device:none  Musculoskeletal: gait and station Limp left, muscle tone and strength are normal, no tremors or atrophy is present.  .  Neurological: coordination overall normal.  Deep tendon reflex/nerve stretch intact.  Sensation normal.  Cranial nerves II-XII intact.   Skin:   Normal overall no scars, lesions, ulcers or rashes. No psoriasis.  Psychiatric: Alert and oriented x 3.  Recent memory intact, remote memory unclear.  Normal mood and affect. Well groomed.  Good eye contact.  Cardiovascular: overall no swelling, no varicosities, no edema bilaterally, normal temperatures of the legs and arms, no clubbing, cyanosis and good capillary refill.  Lymphatic: palpation is normal.  The left lower extremity is examined:  Inspection:  Thigh:  Non-tender and no defects  Knee has swelling 1+ effusion.                        Joint tenderness is present                        Patient is tender over the medial joint line  Lower Leg:  Has normal appearance and no tenderness or defects  Ankle:  Non-tender and no defects  Foot:  Non-tender and no defects Range of Motion:  Knee:  Range of motion is: 0-110                        Crepitus is  present  Ankle:  Range of motion is normal. Strength and Tone:  The left lower extremity has normal strength and tone. Stability:  Knee:  The knee is stable.  Ankle:  The ankle is stable.    The patient has been educated about the nature of the problem(s) and counseled on treatment options.  The patient appeared to understand what I have discussed and is in  agreement with it.  Encounter Diagnosis  Name Primary?  . Chronic pain of left knee Yes    PLAN Call if any problems.  Precautions discussed.  Continue current medications.   Return to clinic 3 months   Electronically Signed Sanjuana Kava, MD 1/4/20182:40 PM

## 2016-09-11 ENCOUNTER — Ambulatory Visit: Payer: Medicaid Other | Admitting: Gastroenterology

## 2016-10-03 ENCOUNTER — Ambulatory Visit (INDEPENDENT_AMBULATORY_CARE_PROVIDER_SITE_OTHER): Payer: Medicaid Other | Admitting: Gastroenterology

## 2016-10-03 ENCOUNTER — Encounter: Payer: Self-pay | Admitting: Gastroenterology

## 2016-10-03 VITALS — BP 125/79 | HR 78 | Temp 97.6°F | Ht 64.0 in | Wt 202.6 lb

## 2016-10-03 DIAGNOSIS — R1310 Dysphagia, unspecified: Secondary | ICD-10-CM

## 2016-10-03 NOTE — Progress Notes (Addendum)
REVIEWED-NO ADDITIONAL RECOMMENDATIONS.  Referring Provider: Rosita Fire, MD Primary Care Physician:  Rosita Fire, MD Primary GI: Dr. Oneida Alar    Chief Complaint  Patient presents with  . Rectal Bleeding    HPI:   Dawn Barnes is a 51 y.o. female presenting today with a history of dysphagia likely secondary to web and/or poor dentition. Colonoscopy with mild proctitis in setting of NSAIDs. Last seen in Aug 2017 and here for 6 month follow-up.   Sometimes gets strangled with cereal. Finally got all her teeth pulled and fitted for dentures. First day she wore them didn't like them and hasn't used since. Didn't like the way it felt when eating. A lot of things she would like to eat but is hard without dentition. Sometimes her stomach will get upset with things that she eats. Sometimes will strain, sometimes not. Has colace that she takes as needed, which she feels helps. Weights are stable. Taking Prilosec 40 mg once daily. Early in the morning taking her thyroid pill. Scant hematochezia if straining.     Past Medical History:  Diagnosis Date  . Anxiety   . Constipation 04/30/2013  . Depression   . Diabetes mellitus without complication (HCC)    borderline  . Fibroids, intramural 01/21/2016  . GERD (gastroesophageal reflux disease)   . Heartburn   . Hot flashes 01/11/2016  . Mixed incontinence   . MS (multiple sclerosis) ([place])   . Pancreatitis   . PONV (postoperative nausea and vomiting)   . PTSD (post-traumatic stress disorder)   . Thyroid disease   . Urge urinary incontinence 05/10/2015  . Vaginal bleeding 01/11/2016    Past Surgical History:  Procedure Laterality Date  . BIOPSY  12/07/2015   Procedure: BIOPSY;  Surgeon: Danie Binder, MD;  [facility];  Service: Endoscopy;;  right and left colon biopsies, gastric bx's   . COLONOSCOPY WITH PROPOFOL N/A 12/07/2015   Dr. Oneida Alar: mild proctitis, hyperplastic polyp   . ENDOMETRIAL ABLATION    .  ESOPHAGOGASTRODUODENOSCOPY (EGD) WITH PROPOFOL N/A 12/07/2015   Dr. 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   . INCONTINENCE SURGERY    . SAVORY DILATION N/A 12/07/2015   Procedure: SAVORY DILATION;  Surgeon: Danie Binder, MD;  [facility];  Service: Endoscopy;  Laterality: N/A;  . TUBAL LIGATION      Current Outpatient Prescriptions  Medication Sig Dispense Refill  . acetaminophen (TYLENOL) 325 MG tablet Take 650 mg by mouth every 6 (six) hours as needed.    . cetirizine (ZYRTEC) 10 MG tablet Take 10 mg by mouth daily.    . Dimethyl Fumarate (TECFIDERA) 240 MG CPDR Take 240 mg by mouth 2 (two) times daily.    Mariane Baumgarten Sodium (COLACE PO) Take by mouth as needed.    Marland Kitchen escitalopram (LEXAPRO) 20 MG tablet Take 20 mg by mouth daily.    . fesoterodine (TOVIAZ) 4 MG TB24 tablet Take 8 mg by mouth daily.     Marland Kitchen levothyroxine (SYNTHROID, LEVOTHROID) 137 MCG tablet TAKE 1 TABLET BY MOUTH DAILY BEFORE BREAKFAST. 30 tablet 6  . omeprazole (PRILOSEC) 20 MG capsule Take 40 mg by mouth daily.     Marland Kitchen UNABLE TO FIND Vit B 12 injection-every other month    . Vitamin D, Ergocalciferol, (DRISDOL) 50000 units CAPS capsule TAKE 1 CAPSULE BY MOUTH EVERY 7 DAYS. 4 capsule 2  . mesalamine (CANASA) 1000 MG suppository Place 1 suppository (1,000 mg  total) rectally at bedtime. 14 suppository 1  . sulfamethoxazole-trimethoprim (BACTRIM DS,SEPTRA DS) 800-160 MG tablet Take 1 tablet by mouth 2 (two) times daily. (Patient not taking: Reported on 10/03/2016) 14 tablet 0   No current facility-administered medications for this visit.     Allergies as of 10/03/2016 - Review Complete 10/03/2016  Allergen Reaction Noted  . Nsaids Other (See Comments) 01/17/2016  . Tuberculin tests Dermatitis 01/18/2015    Family History  Problem Relation Age of Onset  . Diabetes Father   . Heart disease Father   . Hypertension Paternal Aunt   . Heart disease Paternal  Aunt   . Seizures Paternal Aunt   . Breast cancer Paternal Aunt   . Heart disease Paternal Grandmother   . Depression Daughter   . Hypertension Sister   . Heart disease Sister   . Depression Son   . ADD / ADHD Son   . Depression Daughter   . Colon cancer Neg Hx     Social History   Social History  . Marital status: Legally Separated    Spouse name: N/A  . Number of children: N/A  . Years of education: N/A   Occupational History  . disability    Social History Main Topics  . Smoking status: Never Smoker  . Smokeless tobacco: Never Used  . Alcohol use No  . Drug use: No  . Sexual activity: No     Comment: tubal and ablation   Other Topics Concern  . None   Social History Narrative  . None    Review of Systems: As mentioned in HPI   Physical Exam: BP 125/79   Pulse 78   Temp 97.6 F (36.4 C) (Oral)   Ht 5\' 4"  (1.626 m)   Wt 202 lb 9.6 oz (91.9 kg)   BMI 34.78 kg/m  General:   Alert and oriented. No distress noted. Pleasant and cooperative.  Head:  Normocephalic and atraumatic. Eyes:  Conjuctiva clear without scleral icterus. Mouth:  Oral mucosa pink and moist. Good dentition. No lesions. Abdomen:  +BS, soft, non-tender and non-distended. No rebound or guarding. No HSM or masses noted. Msk:  Symmetrical without gross deformities. Normal posture. Extremities:  Without edema. Neurologic:  Alert and  oriented x4 Psych:  Alert and cooperative. Normal mood and affect.

## 2016-10-03 NOTE — Assessment & Plan Note (Signed)
Overall had improved with dilation. Edentulous status is a contributor. Has dentures now but not wearing. Discussed wearing dentures, chewing well with small bites, and seeing how she progresses. If she does not note improvement, will need BPE. Return in 6 months.

## 2016-10-03 NOTE — Patient Instructions (Signed)
Continue Prilosec on an empty stomach once a day.   We will see you in 6 months! Try to use your dentures and see how that works. If you still have problems swallowing, let me know.

## 2016-10-04 NOTE — Progress Notes (Signed)
cc'ed to pcp °

## 2016-11-02 ENCOUNTER — Encounter: Payer: Self-pay | Admitting: Orthopaedic Surgery

## 2016-11-02 ENCOUNTER — Ambulatory Visit (INDEPENDENT_AMBULATORY_CARE_PROVIDER_SITE_OTHER): Payer: Medicaid Other | Admitting: Orthopaedic Surgery

## 2016-11-02 ENCOUNTER — Ambulatory Visit (INDEPENDENT_AMBULATORY_CARE_PROVIDER_SITE_OTHER): Payer: Medicaid Other

## 2016-11-02 VITALS — BP 140/85 | HR 87 | Temp 98.1°F | Ht 64.0 in | Wt 200.0 lb

## 2016-11-02 DIAGNOSIS — M25562 Pain in left knee: Secondary | ICD-10-CM

## 2016-11-02 DIAGNOSIS — G8929 Other chronic pain: Secondary | ICD-10-CM

## 2016-11-02 DIAGNOSIS — M25512 Pain in left shoulder: Secondary | ICD-10-CM

## 2016-11-02 NOTE — Progress Notes (Signed)
Patient Dawn Barnes, female DOB:10-24-1965, 51 y.o. XBJ:[id number]  Chief Complaint  Patient presents with  . Follow-up    left knee    HPI  Dawn Barnes is a 51 y.o. female who has chronic pain of the left knee.  She has been doing well with the knee.  She has no new trauma, no giving way.  She has less pain and swelling.  She has pain of the left shoulder. She has no trauma or swelling.  It hurts sometimes and other times not.  She has no numbness or neck pain. She has taken Tylenol.  She cannot take NSAIDs. HPI  Body mass index is 34.33 kg/m.  ROS  Review of Systems  HENT: Negative for congestion.   Respiratory: Negative for cough and shortness of breath.   Cardiovascular: Negative for chest pain.  Endocrine: Positive for cold intolerance.  Musculoskeletal: Positive for arthralgias, gait problem, joint swelling and myalgias.  Allergic/Immunologic: Positive for environmental allergies.  Neurological: Negative for numbness.    Past Medical History:  Diagnosis Date  . Anxiety   . Constipation 04/30/2013  . Depression   . Diabetes mellitus without complication (HCC)    borderline  . Fibroids, intramural 01/21/2016  . GERD (gastroesophageal reflux disease)   . Heartburn   . Hot flashes 01/11/2016  . Mixed incontinence   . MS (multiple sclerosis) ([place])   . Pancreatitis   . PONV (postoperative nausea and vomiting)   . PTSD (post-traumatic stress disorder)   . Thyroid disease   . Urge urinary incontinence 05/10/2015  . Vaginal bleeding 01/11/2016    Past Surgical History:  Procedure Laterality Date  . BIOPSY  12/07/2015   Procedure: BIOPSY;  Surgeon: Danie Binder, MD;  [facility];  Service: Endoscopy;;  right and left colon biopsies, gastric bx's   . COLONOSCOPY WITH PROPOFOL N/A 12/07/2015   Dr. Oneida Alar: mild proctitis, hyperplastic polyp   . ENDOMETRIAL ABLATION    . ESOPHAGOGASTRODUODENOSCOPY (EGD) WITH PROPOFOL N/A 12/07/2015   Dr. Oneida Alar: possible web  in proximal esophagus s/p dilation, erosive gastritis (negative H.pylori).   . INCONTINENCE SURGERY    . SAVORY DILATION N/A 12/07/2015   Procedure: SAVORY DILATION;  Surgeon: Danie Binder, MD;  [facility]: [facility];  Service: Endoscopy;  Laterality: N/A;  . TUBAL LIGATION      Family History  Problem Relation Age of Onset  . Diabetes Father   . Heart disease Father   . Hypertension Paternal Aunt   . Heart disease Paternal Aunt   . Seizures Paternal Aunt   . Breast cancer Paternal Aunt   . Heart disease Paternal Grandmother   . Depression Daughter   . Hypertension Sister   . Heart disease Sister   . Depression Son   . ADD / ADHD Son   . Depression Daughter   . Colon cancer Neg Hx     Social History Social History  Substance Use Topics  . Smoking status: Never Smoker  . Smokeless tobacco: Never Used  . Alcohol use No    Allergies  Allergen Reactions  . Nsaids Other (See Comments)    Pt states MD told her not to take due to stomach irritation  . Tuberculin Tests Dermatitis    Current Outpatient Prescriptions  Medication Sig Dispense Refill  . acetaminophen (TYLENOL) 325 MG tablet Take 650 mg by mouth every 6 (six) hours as needed.    . cetirizine (ZYRTEC) 10 MG tablet Take 10 mg by  mouth daily.    . Dimethyl Fumarate (TECFIDERA) 240 MG CPDR Take 240 mg by mouth 2 (two) times daily.    Mariane Baumgarten Sodium (COLACE PO) Take by mouth as needed.    Marland Kitchen escitalopram (LEXAPRO) 20 MG tablet Take 20 mg by mouth daily.    . fesoterodine (TOVIAZ) 4 MG TB24 tablet Take 8 mg by mouth daily.     Marland Kitchen levothyroxine (SYNTHROID, LEVOTHROID) 137 MCG tablet TAKE 1 TABLET BY MOUTH DAILY BEFORE BREAKFAST. 30 tablet 6  . omeprazole (PRILOSEC) 20 MG capsule Take 40 mg by mouth daily.     Marland Kitchen UNABLE TO FIND Vit B 12 injection-every other month    . Vitamin D, Ergocalciferol, (DRISDOL) 50000 units CAPS capsule TAKE 1 CAPSULE BY MOUTH EVERY 7 DAYS. 4 capsule 2   No current facility-administered  medications for this visit.      Physical Exam  Blood pressure 140/85, pulse 87, temperature 98.1 F (36.7 C), height 5\' 4"  (1.626 m), weight 200 lb (90.7 kg).  Constitutional: overall normal hygiene, normal nutrition, well developed, normal grooming, normal body habitus. Assistive device:none  Musculoskeletal: gait and station Limp left, muscle tone and strength are normal, no tremors or atrophy is present.  .  Neurological: coordination overall normal.  Deep tendon reflex/nerve stretch intact.  Sensation normal.  Cranial nerves II-XII intact.   Skin:   Normal overall no scars, lesions, ulcers or rashes. No psoriasis.  Psychiatric: Alert and oriented x 3.  Recent memory intact, remote memory unclear.  Normal mood and affect. Well groomed.  Good eye contact.  Cardiovascular: overall no swelling, no varicosities, no edema bilaterally, normal temperatures of the legs and arms, no clubbing, cyanosis and good capillary refill.  Lymphatic: palpation is normal. The left lower extremity is examined:  Inspection:  Thigh:  Non-tender and no defects  Knee has swelling 1+ effusion.                        Joint tenderness is present                        Patient is tender over the medial joint line  Lower Leg:  Has normal appearance and no tenderness or defects  Ankle:  Non-tender and no defects  Foot:  Non-tender and no defects Range of Motion:  Knee:  Range of motion is: 0-105                        Crepitus is  present  Ankle:  Range of motion is normal. Strength and Tone:  The left lower extremity has normal strength and tone. Stability:  Knee:  The knee is stable.  Ankle:  The ankle is stable.   Examination of left Upper Extremity is done.  Inspection:   Overall:  Elbow non-tender without crepitus or defects, forearm non-tender without crepitus or defects, wrist non-tender without crepitus or defects, hand non-tender.    Shoulder: with glenohumeral joint tenderness, without  effusion.   Upper arm: without swelling and tenderness   Range of motion:   Overall:  Full range of motion of the elbow, full range of motion of wrist and full range of motion in fingers.   Shoulder:  left  165 degrees forward flexion; 145 degrees abduction; 35 degrees internal rotation, 35 degrees external rotation, 15 degrees extension, 40 degrees adduction.   Stability:   Overall:  Shoulder, elbow and wrist  stable   Strength and Tone:   Overall full shoulder muscles strength, full upper arm strength and normal upper arm bulk and tone.  X-rays were done of the left shoulder, reported separately.  The patient has been educated about the nature of the problem(s) and counseled on treatment options.  The patient appeared to understand what I have discussed and is in agreement with it.  Encounter Diagnoses  Name Primary?  . Chronic left shoulder pain Yes  . Chronic pain of left knee     PLAN Call if any problems.  Precautions discussed.  Continue current medications.   Return to clinic 3 months   Electronically Signed Sanjuana Kava, MD 4/5/20182:41 PM

## 2016-12-14 ENCOUNTER — Other Ambulatory Visit: Payer: Self-pay | Admitting: "Endocrinology

## 2017-01-22 ENCOUNTER — Other Ambulatory Visit: Payer: Self-pay | Admitting: "Endocrinology

## 2017-01-22 LAB — T4, FREE: Free T4: 1.5 ng/dL (ref 0.8–1.8)

## 2017-01-22 LAB — TSH: TSH: 0.02 m[IU]/L — ABNORMAL LOW

## 2017-01-23 LAB — HEMOGLOBIN A1C
Hgb A1c MFr Bld: 5.2 % (ref <5.7)
MEAN PLASMA GLUCOSE: 103 mg/dL

## 2017-01-24 ENCOUNTER — Other Ambulatory Visit (HOSPITAL_COMMUNITY): Payer: Self-pay | Admitting: Internal Medicine

## 2017-01-24 DIAGNOSIS — Z1231 Encounter for screening mammogram for malignant neoplasm of breast: Secondary | ICD-10-CM

## 2017-01-29 ENCOUNTER — Ambulatory Visit: Payer: Medicaid Other | Admitting: "Endocrinology

## 2017-02-01 ENCOUNTER — Ambulatory Visit: Payer: Medicaid Other | Admitting: Orthopaedic Surgery

## 2017-02-06 ENCOUNTER — Ambulatory Visit (INDEPENDENT_AMBULATORY_CARE_PROVIDER_SITE_OTHER): Payer: Medicaid Other | Admitting: Orthopaedic Surgery

## 2017-02-06 ENCOUNTER — Encounter: Payer: Self-pay | Admitting: Orthopaedic Surgery

## 2017-02-06 VITALS — BP 127/82 | HR 57 | Temp 98.8°F | Ht 64.0 in | Wt 199.0 lb

## 2017-02-06 DIAGNOSIS — M25512 Pain in left shoulder: Secondary | ICD-10-CM

## 2017-02-06 DIAGNOSIS — M25562 Pain in left knee: Secondary | ICD-10-CM | POA: Diagnosis not present

## 2017-02-06 DIAGNOSIS — G8929 Other chronic pain: Secondary | ICD-10-CM | POA: Diagnosis not present

## 2017-02-06 NOTE — Progress Notes (Signed)
Patient KG:YJEH Dawn EDGECOMBE, female DOB:04/22/1966, 51 y.o. UDJ:[id number]  Chief Complaint  Patient presents with  . Follow-up    left knee, left shoulder    HPI  Dawn Barnes is a 51 y.o. female who has had pain of the left shoulder and left knee.  She is doing well now with both.  She has no pain, no swelling, no numbness, no giving way.  She is active. HPI  Body mass index is 34.16 kg/m.  ROS  Review of Systems  HENT: Negative for congestion.   Respiratory: Negative for cough and shortness of breath.   Cardiovascular: Negative for chest pain.  Endocrine: Positive for cold intolerance.  Musculoskeletal: Positive for arthralgias, gait problem, joint swelling and myalgias.  Allergic/Immunologic: Positive for environmental allergies.  Neurological: Negative for numbness.    Past Medical History:  Diagnosis Date  . Anxiety   . Constipation 04/30/2013  . Depression   . Diabetes mellitus without complication (HCC)    borderline  . Fibroids, intramural 01/21/2016  . GERD (gastroesophageal reflux disease)   . Heartburn   . Hot flashes 01/11/2016  . Mixed incontinence   . MS (multiple sclerosis) ([place])   . Pancreatitis   . PONV (postoperative nausea and vomiting)   . PTSD (post-traumatic stress disorder)   . Thyroid disease   . Urge urinary incontinence 05/10/2015  . Vaginal bleeding 01/11/2016    Past Surgical History:  Procedure Laterality Date  . BIOPSY  12/07/2015   Procedure: BIOPSY;  Surgeon: Danie Binder, MD;  [facility];  Service: Endoscopy;;  right and left colon biopsies, gastric bx's   . COLONOSCOPY WITH PROPOFOL N/A 12/07/2015   Dr. Oneida Alar: mild proctitis, hyperplastic polyp   . ENDOMETRIAL ABLATION    . ESOPHAGOGASTRODUODENOSCOPY (EGD) WITH PROPOFOL N/A 12/07/2015   Dr. 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   . INCONTINENCE SURGERY    . SAVORY DILATION N/A 12/07/2015   Procedure: SAVORY DILATION;   Surgeon: Danie Binder, MD;  [facility];  Service: Endoscopy;  Laterality: N/A;  . TUBAL LIGATION      Family History  Problem Relation Age of Onset  . Diabetes Father   . Heart disease Father   . Hypertension Paternal Aunt   . Heart disease Paternal Aunt   . Seizures Paternal Aunt   . Breast cancer Paternal Aunt   . Heart disease Paternal Grandmother   . Depression Daughter   . Hypertension Sister   . Heart disease Sister   . Depression Son   . ADD / ADHD Son   . Depression Daughter   . Colon cancer Neg Hx     Social History Social History  Substance Use Topics  . Smoking status: Never Smoker  . Smokeless tobacco: Never Used  . Alcohol use No    Allergies  Allergen Reactions  . Nsaids Other (See Comments)    Pt states MD told her not to take due to stomach irritation  . Tuberculin Tests Dermatitis    Current Outpatient Prescriptions  Medication Sig Dispense Refill  . acetaminophen (TYLENOL) 325 MG tablet Take 650 mg by mouth every 6 (six) hours as needed.    . cetirizine (ZYRTEC) 10 MG tablet Take 10 mg by mouth daily.    . Dimethyl Fumarate (TECFIDERA) 240 MG CPDR Take 240 mg by mouth 2 (two) times daily.    Mariane Baumgarten Sodium (COLACE PO) Take by mouth as needed.    Marland Kitchen  escitalopram (LEXAPRO) 20 MG tablet Take 20 mg by mouth daily.    . fesoterodine (TOVIAZ) 4 MG TB24 tablet Take 8 mg by mouth daily.     Marland Kitchen levothyroxine (SYNTHROID, LEVOTHROID) 137 MCG tablet TAKE 1 TABLET BY MOUTH DAILY BEFORE BREAKFAST. 30 tablet 6  . omeprazole (PRILOSEC) 20 MG capsule Take 40 mg by mouth daily.     Marland Kitchen UNABLE TO FIND Vit B 12 injection-every other month    . Vitamin D, Ergocalciferol, (DRISDOL) 50000 units CAPS capsule TAKE 1 CAPSULE BY MOUTH EVERY 7 DAYS. 4 capsule 0   No current facility-administered medications for this visit.      Physical Exam  Blood pressure 127/82, pulse (!) 57, temperature 98.8 F (37.1 C), height 5\' 4"  (1.626 m), weight 199 lb (90.3  kg).  Constitutional: overall normal hygiene, normal nutrition, well developed, normal grooming, normal body habitus. Assistive device:none  Musculoskeletal: gait and station Limp none, muscle tone and strength are normal, no tremors or atrophy is present.  .  Neurological: coordination overall normal.  Deep tendon reflex/nerve stretch intact.  Sensation normal.  Cranial nerves II-XII intact.   Skin:   Normal overall no scars, lesions, ulcers or rashes. No psoriasis.  Psychiatric: Alert and oriented x 3.  Recent memory intact, remote memory unclear.  Normal mood and affect. Well groomed.  Good eye contact.  Cardiovascular: overall no swelling, no varicosities, no edema bilaterally, normal temperatures of the legs and arms, no clubbing, cyanosis and good capillary refill.  Lymphatic: palpation is normal.  She has full ROM of both shoulders and no pain.  NV intact.  She has full ROM of both knees, no pain, NV intact.  The patient has been educated about the nature of the problem(s) and counseled on treatment options.  The patient appeared to understand what I have discussed and is in agreement with it.  Encounter Diagnoses  Name Primary?  . Chronic left shoulder pain Yes  . Chronic pain of left knee     PLAN Call if any problems.  Precautions discussed.  Continue current medications.   Return to clinic prn   Electronically Signed Sanjuana Kava, MD 7/10/20182:31 PM

## 2017-02-08 ENCOUNTER — Other Ambulatory Visit: Payer: Self-pay | Admitting: "Endocrinology

## 2017-02-19 ENCOUNTER — Ambulatory Visit (HOSPITAL_COMMUNITY)
Admission: RE | Admit: 2017-02-19 | Discharge: 2017-02-19 | Disposition: A | Discharge status: Home / Self Care | Payer: Medicaid Other | Source: Ambulatory Visit | Attending: Internal Medicine | Admitting: Internal Medicine

## 2017-02-19 DIAGNOSIS — Z1231 Encounter for screening mammogram for malignant neoplasm of breast: Secondary | ICD-10-CM | POA: Insufficient documentation

## 2017-03-05 ENCOUNTER — Ambulatory Visit (INDEPENDENT_AMBULATORY_CARE_PROVIDER_SITE_OTHER): Payer: Medicaid Other | Admitting: "Endocrinology

## 2017-03-05 ENCOUNTER — Encounter: Payer: Self-pay | Admitting: "Endocrinology

## 2017-03-05 VITALS — BP 124/82 | HR 82 | Wt 202.0 lb

## 2017-03-05 DIAGNOSIS — E038 Other specified hypothyroidism: Secondary | ICD-10-CM | POA: Diagnosis not present

## 2017-03-05 DIAGNOSIS — R7303 Prediabetes: Secondary | ICD-10-CM

## 2017-03-05 DIAGNOSIS — E669 Obesity, unspecified: Secondary | ICD-10-CM | POA: Diagnosis not present

## 2017-03-05 MED ORDER — LEVOTHYROXINE SODIUM 137 MCG PO TABS: ORAL_TABLET | ORAL | 6 refills | Status: DC

## 2017-03-05 NOTE — Progress Notes (Signed)
Subjective:    Patient ID: Dawn Barnes, female    DOB: 1965/12/12, PCP Rosita Fire, MD   Past Medical History:  Diagnosis Date  . Anxiety   . Constipation 04/30/2013  . Depression   . Diabetes mellitus without complication (HCC)    borderline  . Fibroids, intramural 01/21/2016  . GERD (gastroesophageal reflux disease)   . Heartburn   . Hot flashes 01/11/2016  . Mixed incontinence   . MS (multiple sclerosis) ([place])   . Pancreatitis   . PONV (postoperative nausea and vomiting)   . PTSD (post-traumatic stress disorder)   . Thyroid disease   . Urge urinary incontinence 05/10/2015  . Vaginal bleeding 01/11/2016   Past Surgical History:  Procedure Laterality Date  . BIOPSY  12/07/2015   Procedure: BIOPSY;  Surgeon: Danie Binder, MD;  [facility];  Service: Endoscopy;;  right and left colon biopsies, gastric bx's   . COLONOSCOPY WITH PROPOFOL N/A 12/07/2015   Dr. Oneida Alar: mild proctitis, hyperplastic polyp   . ENDOMETRIAL ABLATION    . ESOPHAGOGASTRODUODENOSCOPY (EGD) WITH PROPOFOL N/A 12/07/2015   Dr. 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   . INCONTINENCE SURGERY    . SAVORY DILATION N/A 12/07/2015   Procedure: SAVORY DILATION;  Surgeon: Danie Binder, MD;  [facility];  Service: Endoscopy;  Laterality: N/A;  . TUBAL LIGATION     Social History   Social History  . Marital status: Legally Separated    Spouse name: N/A  . Number of children: N/A  . Years of education: N/A   Occupational History  . disability    Social History Main Topics  . Smoking status: Never Smoker  . Smokeless tobacco: Never Used  . Alcohol use No  . Drug use: No  . Sexual activity: No     Comment: tubal and ablation   Other Topics Concern  . None   Social History Narrative  . None   Outpatient Encounter Prescriptions as of 03/05/2017  Medication Sig  . acetaminophen (TYLENOL) 325 MG tablet Take 650 mg by  mouth every 6 (six) hours as needed.  . cetirizine (ZYRTEC) 10 MG tablet Take 10 mg by mouth daily.  . Dimethyl Fumarate (TECFIDERA) 240 MG CPDR Take 240 mg by mouth 2 (two) times daily.  Mariane Baumgarten Sodium (COLACE PO) Take by mouth as needed.  Marland Kitchen escitalopram (LEXAPRO) 20 MG tablet Take 20 mg by mouth daily.  . fesoterodine (TOVIAZ) 4 MG TB24 tablet Take 8 mg by mouth daily.   Marland Kitchen levothyroxine (SYNTHROID, LEVOTHROID) 137 MCG tablet TAKE 1 TABLET BY MOUTH DAILY BEFORE BREAKFAST.  Marland Kitchen omeprazole (PRILOSEC) 20 MG capsule Take 40 mg by mouth daily.   Marland Kitchen UNABLE TO FIND Vit B 12 injection-every other month  . Vitamin D, Ergocalciferol, (DRISDOL) 50000 units CAPS capsule TAKE 1 CAPSULE BY MOUTH EVERY 7 DAYS.  . [DISCONTINUED] levothyroxine (SYNTHROID, LEVOTHROID) 137 MCG tablet TAKE 1 TABLET BY MOUTH DAILY BEFORE BREAKFAST.   No facility-administered encounter medications on file as of 03/05/2017.    ALLERGIES: Allergies  Allergen Reactions  . Nsaids Other (See Comments)    Pt states MD told her not to take due to stomach irritation  . Tuberculin Tests Dermatitis   VACCINATION STATUS: Immunization History  Administered Date(s) Administered  . Influenza-Unspecified 04/30/2014, 03/15/2015    HPI  51 year old female patient with medical history of hypothyroidism from Hashimoto's thyroiditis. She is on levothyroxine 137  g by mouth every morning. She has reasonable compliance. She denies heat/cold complaint, still complains of fatigue, and dry skin.  She also has history of prediabetes on exercise and diet.   Review of Systems   Constitutional:  + fatigue, no subjective hyperthermia/hypothermia Eyes: no blurry vision, no xerophthalmia ENT: no sore throat, no nodules palpated in throat, no dysphagia/odynophagia, no hoarseness Cardiovascular: no CP/SOB/palpitations/leg swelling Respiratory: no cough/SOB Gastrointestinal: no N/V/D/C Musculoskeletal: no muscle/joint aches Skin: no rashes, + dry  skin . Neurological: no tremors/numbness/tingling/dizziness Psychiatric: no depression/anxiety  Objective:    BP 124/82   Pulse 82   Wt 202 lb (91.6 kg)   SpO2 94%   BMI 34.67 kg/m   Wt Readings from Last 3 Encounters:  03/05/17 202 lb (91.6 kg)  02/06/17 199 lb (90.3 kg)  11/02/16 200 lb (90.7 kg)    Physical Exam Constitutional: overweight, in NAD Eyes: PERRLA, EOMI, no exophthalmos ENT: moist mucous membranes, no thyromegaly, no cervical lymphadenopathy Cardiovascular: RRR, No MRG Respiratory: CTA B Gastrointestinal: abdomen soft, NT, ND, BS+ Musculoskeletal: no deformities, strength intact in all 4 Skin: + Diffusely dry skin on bilateral upper and lower extremities, no rashes Neurological: no tremor with outstretched hands, DTR normal in all 4  Recent Results (from the past 2160 hour(s))  TSH     Status: Abnormal   Collection Time: 01/22/17  9:24 AM  Result Value Ref Range   TSH 0.02 (L) mIU/L    Comment:   Reference Range   > or = 20 Years  0.40-4.50   Pregnancy Range First trimester  0.26-2.66 Second trimester 0.55-2.73 Third trimester  0.43-2.91     T4, free     Status: None   Collection Time: 01/22/17  9:24 AM  Result Value Ref Range   Free T4 1.5 0.8 - 1.8 ng/dL  Hemoglobin A1c     Status: None   Collection Time: 01/22/17  9:24 AM  Result Value Ref Range   Hgb A1c MFr Bld 5.2 <5.7 %    Comment:   For the purpose of screening for the presence of diabetes:   <5.7%       Consistent with the absence of diabetes 5.7-6.4 %   Consistent with increased risk for diabetes (prediabetes) >=6.5 %     Consistent with diabetes   This assay result is consistent with a decreased risk of diabetes.   Currently, no consensus exists regarding use of hemoglobin A1c for diagnosis of diabetes in children.   According to American Diabetes Association (ADA) guidelines, hemoglobin A1c <7.0% represents optimal control in non-pregnant diabetic patients. Different metrics  may apply to specific patient populations. Standards of Medical Care in Diabetes (ADA).      Mean Plasma Glucose 103 mg/dL      Assessment & Plan:   1. Hypothyroidism - Her thyroid function tests are constant with appropriate replacement. I will continue levothyroxine 137 g by mouth every morning before breakfast.   - We discussed about correct intake of levothyroxine, at fasting, with water, separated by at least 30 minutes from breakfast, and separated by more than 4 hours from calcium, iron, multivitamins, acid reflux medications (PPIs). -Patient is made aware of the fact that thyroid hormone replacement is needed for life, dose to be adjusted by periodic monitoring of thyroid function tests. - I advised her on moisturizing skin with Vaseline.  2. Pre-diabetes -Her last  A1c was 5.8%. She will not need medications for now. She is encouraged to stay active and  avoid simple/processed carbohydrates.  3. Vitamin D deficiency -This is a new diagnosis for her. I will continue vitamin D replacement 50,000 units  every 2 weeks.  4.  obesity due to excess calories ([place])  I have given her detailed carbohydrate and exercise regimen. - 20 minutes of time was spent on the care of this patient , 50% of which was applied for counseling on diabetes complications and their preventions.  - I advised patient to maintain close follow up with Rosita Fire, MD for primary care needs. Follow up plan: Return in about 6 months (around 09/05/2017) for follow up with pre-visit labs.  Glade Lloyd, MD Phone: [phone]  Fax: [phone]   03/05/2017, 2:11 PM

## 2017-04-09 ENCOUNTER — Ambulatory Visit (INDEPENDENT_AMBULATORY_CARE_PROVIDER_SITE_OTHER): Payer: Medicaid Other | Admitting: Gastroenterology

## 2017-04-09 ENCOUNTER — Encounter: Payer: Self-pay | Admitting: Gastroenterology

## 2017-04-09 VITALS — BP 134/91 | HR 79 | Temp 97.5°F | Ht 64.0 in | Wt 200.8 lb

## 2017-04-09 DIAGNOSIS — R1310 Dysphagia, unspecified: Secondary | ICD-10-CM

## 2017-04-09 NOTE — Progress Notes (Signed)
Referring Provider: Rosita Fire, MD Primary Care Physician:  Rosita Fire, MD Primary GI: Dr. Oneida Alar   Chief Complaint  Patient presents with  . Gastroesophageal Reflux  . Dysphagia    HPI:   Dawn Barnes is a 51 y.o. female presenting today with a history of dysphagia likely secondary to web and/or poor dentition. Colonoscopy with mild proctitis in setting of NSAIDs. Edentulous. Prilosec 40 mg once daily. Here for follow-up of dysphagia. Consideration for BPE if no improvement despite behavior modification. Not wearing dentures.   Will occasionally get strangled, otherwise doing well. Still has problems with constipation. Sometimes vague pain lower abdomen. Certain things she eats make her stomach hurt. Takes colace as needed, which helps. Doesn't take it every day. Will have nausea with certain types of foods.   Past Medical History:  Diagnosis Date  . Anxiety   . Constipation 04/30/2013  . Depression   . Diabetes mellitus without complication (HCC)    borderline  . Fibroids, intramural 01/21/2016  . GERD (gastroesophageal reflux disease)   . Heartburn   . Hot flashes 01/11/2016  . Mixed incontinence   . MS (multiple sclerosis) ([place])   . Pancreatitis   . PONV (postoperative nausea and vomiting)   . PTSD (post-traumatic stress disorder)   . Thyroid disease   . Urge urinary incontinence 05/10/2015  . Vaginal bleeding 01/11/2016    Past Surgical History:  Procedure Laterality Date  . BIOPSY  12/07/2015   Procedure: BIOPSY;  Surgeon: Danie Binder, MD;  [facility];  Service: Endoscopy;;  right and left colon biopsies, gastric bx's   . COLONOSCOPY WITH PROPOFOL N/A 12/07/2015   Dr. Oneida Alar: mild proctitis, hyperplastic polyp   . ENDOMETRIAL ABLATION    . ESOPHAGOGASTRODUODENOSCOPY (EGD) WITH PROPOFOL N/A 12/07/2015   Dr. 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   . INCONTINENCE SURGERY    . SAVORY DILATION  N/A 12/07/2015   Procedure: SAVORY DILATION;  Surgeon: Danie Binder, MD;  [facility];  Service: Endoscopy;  Laterality: N/A;  . TUBAL LIGATION      Current Outpatient Prescriptions  Medication Sig Dispense Refill  . acetaminophen (TYLENOL) 325 MG tablet Take 650 mg by mouth every 6 (six) hours as needed.    . cetirizine (ZYRTEC) 10 MG tablet Take 10 mg by mouth daily.    . Dimethyl Fumarate (TECFIDERA) 240 MG CPDR Take 240 mg by mouth 2 (two) times daily.    Mariane Baumgarten Sodium (COLACE PO) Take by mouth as needed.    Marland Kitchen escitalopram (LEXAPRO) 20 MG tablet Take 20 mg by mouth daily.    . fesoterodine (TOVIAZ) 4 MG TB24 tablet Take 8 mg by mouth daily.     Marland Kitchen levothyroxine (SYNTHROID, LEVOTHROID) 137 MCG tablet TAKE 1 TABLET BY MOUTH DAILY BEFORE BREAKFAST. 30 tablet 6  . omeprazole (PRILOSEC) 20 MG capsule Take 40 mg by mouth daily.     . traMADol (ULTRAM) 50 MG tablet Take 50 mg by mouth every 6 (six) hours as needed.    Marland Kitchen UNABLE TO FIND Vit B 12 injection-every other month    . Vitamin D, Ergocalciferol, (DRISDOL) 50000 units CAPS capsule TAKE 1 CAPSULE BY MOUTH EVERY 7 DAYS. (Patient taking differently: TAKE 1 CAPSULE BY MOUTH EVERY 7 DAYS. Takes every other week) 4 capsule 2   No current facility-administered medications for this visit.     Allergies as of 04/09/2017 - Review Complete  04/09/2017  Allergen Reaction Noted  . Nsaids Other (See Comments) 01/17/2016  . Tuberculin tests Dermatitis 01/18/2015    Family History  Problem Relation Age of Onset  . Diabetes Father   . Heart disease Father   . Hypertension Paternal Aunt   . Heart disease Paternal Aunt   . Seizures Paternal Aunt   . Breast cancer Paternal Aunt   . Heart disease Paternal Grandmother   . Depression Daughter   . Hypertension Sister   . Heart disease Sister   . Depression Son   . ADD / ADHD Son   . Depression Daughter   . Colon cancer Neg Hx     Social History   Social History  . Marital  status: Legally Separated    Spouse name: N/A  . Number of children: N/A  . Years of education: N/A   Occupational History  . disability    Social History Main Topics  . Smoking status: Never Smoker  . Smokeless tobacco: Never Used  . Alcohol use No  . Drug use: No  . Sexual activity: No     Comment: tubal and ablation   Other Topics Concern  . None   Social History Narrative  . None    Review of Systems: As mentioned in HPI   Physical Exam: BP (!) 134/91   Pulse 79   Temp (!) 97.5 F (36.4 C) (Oral)   Ht 5\' 4"  (1.626 m)   Wt 200 lb 12.8 oz (91.1 kg)   BMI 34.47 kg/m  General:   Alert and oriented. No distress noted. Pleasant and cooperative.  Head:  Normocephalic and atraumatic. Eyes:  Conjuctiva clear without scleral icterus. Mouth:  Oral mucosa pink and moist. Edentulous  Abdomen:  +BS, soft, non-tender and non-distended. No rebound or guarding. No HSM or masses noted. Msk:  Symmetrical without gross deformities. Normal posture. Extremities:  Without edema. Neurologic:  Alert and  oriented x4 Psych:  Alert and cooperative. Normal mood and affect.

## 2017-04-09 NOTE — Patient Instructions (Signed)
Continue taking Prilosec 40 milligrams once daily on an empty stomach, 30 minutes before a meal.   Continue to chew well, eat small bites.   We will see you in 6 months.

## 2017-04-09 NOTE — Assessment & Plan Note (Signed)
Improved with dilation. Edentulous. Not wearing dentures although she has these. NO alarm features. Continue Prilosec 40 mg daily. Return in 6 months.

## 2017-04-10 NOTE — Progress Notes (Signed)
cc'ed to pcp °

## 2017-05-16 ENCOUNTER — Encounter: Payer: Self-pay | Admitting: Adult Health

## 2017-05-16 ENCOUNTER — Ambulatory Visit (INDEPENDENT_AMBULATORY_CARE_PROVIDER_SITE_OTHER): Payer: Medicaid Other | Admitting: Adult Health

## 2017-05-16 VITALS — BP 118/78 | HR 72 | Ht 64.5 in | Wt 203.5 lb

## 2017-05-16 DIAGNOSIS — Z01419 Encounter for gynecological examination (general) (routine) without abnormal findings: Secondary | ICD-10-CM

## 2017-05-16 DIAGNOSIS — Z1211 Encounter for screening for malignant neoplasm of colon: Secondary | ICD-10-CM | POA: Diagnosis not present

## 2017-05-16 DIAGNOSIS — Z01411 Encounter for gynecological examination (general) (routine) with abnormal findings: Secondary | ICD-10-CM

## 2017-05-16 DIAGNOSIS — Z1212 Encounter for screening for malignant neoplasm of rectum: Secondary | ICD-10-CM | POA: Diagnosis not present

## 2017-05-16 DIAGNOSIS — R102 Pelvic and perineal pain unspecified side: Secondary | ICD-10-CM | POA: Insufficient documentation

## 2017-05-16 LAB — HEMOCCULT GUIAC POC 1CARD (OFFICE): FECAL OCCULT BLD: NEGATIVE

## 2017-05-16 NOTE — Progress Notes (Signed)
Patient ID: Dawn Barnes, female   DOB: 1965-11-06, 51 y.o.   MRN: [id number] History of Present Illness: Dawn Barnes is a 51 year old white female, divorced, in for a well woman gyn exam, she had normal pap with negative HPV 05/15/16. PCP is Dr Legrand Rams and she sees Dr Dorris Fetch.    Current Medications, Allergies, Past Medical History, Past Surgical History, Family History and Social History were reviewed in [employer] record.     Review of Systems: Patient denies any headaches, hearing loss, fatigue, blurred vision, shortness of breath, chest pain,  problems with bowel movements, urination, or intercourse(not having sex). No joint pain or mood swings. Has pelvic pain at times, none now, has know fibroid.   Physical Exam:BP 118/78 (BP Location: Left Arm, Patient Position: Sitting, Cuff Size: Large)   Pulse 72   Ht 5' 4.5" (1.638 m)   Wt 203 lb 8 oz (92.3 kg)   BMI 34.39 kg/m  General:  Well developed, well nourished, no acute distress Skin:  Warm and dry Neck:  Midline trachea, normal thyroid, good ROM, no lymphadenopathy Lungs; Clear to auscultation bilaterally Breast:  No dominant palpable mass, retraction, or nipple discharge Cardiovascular: Regular rate and rhythm Abdomen:  Soft, non tender, no hepatosplenomegaly Pelvic:  External genitalia is normal in appearance, no lesions.  The vagina is normal in appearance. Urethra has no lesions or masses. The cervix is bulbous.  Uterus is felt to be normal size, shape, and contour.  No adnexal masses or tenderness noted.Bladder is non tender, no masses felt. Rectal: Good sphincter tone, no polyps, or hemorrhoids felt.  Hemoccult negative. Extremities/musculoskeletal:  No swelling or varicosities noted, no clubbing or cyanosis Psych:  No mood changes, alert and cooperative,seems happy PHQ 2 score 1.  Impression: 1. Encounter for well woman exam with routine gynecological exam   2. Screening for colorectal cancer   3. Pelvic  pain in female       Plan: Physical in 1 year Pap in 2020 Labs with PCP  Mammogram yearly

## 2017-05-16 NOTE — Patient Instructions (Signed)
Physical in 1 year Pap in 2020 Labs with PCP  Mammogram yearly

## 2017-07-28 ENCOUNTER — Other Ambulatory Visit: Payer: Self-pay | Admitting: "Endocrinology

## 2017-09-04 LAB — T4, FREE: Free T4: 1 ng/dL (ref 0.8–1.8)

## 2017-09-04 LAB — TSH: TSH: 9.02 mIU/L — ABNORMAL HIGH

## 2017-09-06 ENCOUNTER — Ambulatory Visit: Payer: Medicaid Other | Admitting: "Endocrinology

## 2017-09-13 ENCOUNTER — Encounter: Payer: Self-pay | Admitting: "Endocrinology

## 2017-09-13 ENCOUNTER — Ambulatory Visit (INDEPENDENT_AMBULATORY_CARE_PROVIDER_SITE_OTHER): Payer: Medicaid Other | Admitting: "Endocrinology

## 2017-09-13 VITALS — BP 132/88 | HR 66 | Ht 64.5 in | Wt 202.0 lb

## 2017-09-13 DIAGNOSIS — E038 Other specified hypothyroidism: Secondary | ICD-10-CM | POA: Diagnosis not present

## 2017-09-13 DIAGNOSIS — R7303 Prediabetes: Secondary | ICD-10-CM

## 2017-09-13 MED ORDER — LEVOTHYROXINE SODIUM 150 MCG PO TABS: ORAL_TABLET | ORAL | 6 refills | Status: DC

## 2017-09-13 NOTE — Progress Notes (Signed)
Subjective:    Patient ID: Dawn Barnes, female    DOB: 1966/05/29, PCP Rosita Fire, MD   Past Medical History:  Diagnosis Date  . Anxiety   . Constipation 04/30/2013  . Depression   . Diabetes mellitus without complication (HCC)    borderline  . Fibroids, intramural 01/21/2016  . GERD (gastroesophageal reflux disease)   . Heartburn   . Hot flashes 01/11/2016  . Mixed incontinence   . MS (multiple sclerosis) ([place])   . Pancreatitis   . PONV (postoperative nausea and vomiting)   . PTSD (post-traumatic stress disorder)   . Thyroid disease   . Urge urinary incontinence 05/10/2015  . Vaginal bleeding 01/11/2016   Past Surgical History:  Procedure Laterality Date  . BIOPSY  12/07/2015   Procedure: BIOPSY;  Surgeon: Danie Binder, MD;  [facility];  Service: Endoscopy;;  right and left colon biopsies, gastric bx's   . COLONOSCOPY WITH PROPOFOL N/A 12/07/2015   Dr. Oneida Alar: mild proctitis, hyperplastic polyp   . ENDOMETRIAL ABLATION    . ESOPHAGOGASTRODUODENOSCOPY (EGD) WITH PROPOFOL N/A 12/07/2015   Dr. 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   . INCONTINENCE SURGERY    . SAVORY DILATION N/A 12/07/2015   Procedure: SAVORY DILATION;  Surgeon: Danie Binder, MD;  [facility];  Service: Endoscopy;  Laterality: N/A;  . TUBAL LIGATION     Social History   Socioeconomic History  . Marital status: Legally Separated    Spouse name: None  . Number of children: None  . Years of education: None  . Highest education level: None  Social Needs  . Financial resource strain: None  . Food insecurity - worry: None  . Food insecurity - inability: None  . Transportation needs - medical: None  . Transportation needs - non-medical: None  Occupational History  . Occupation: disability  Tobacco Use  . Smoking status: Never Smoker  . Smokeless tobacco: Never Used  Substance and Sexual Activity  . Alcohol use: No     Alcohol/week: 0.0 oz  . Drug use: No  . Sexual activity: No    Birth control/protection: Surgical    Comment: tubal and ablation  Other Topics Concern  . None  Social History Narrative  . None   Outpatient Encounter Medications as of 09/13/2017  Medication Sig  . acetaminophen (TYLENOL) 325 MG tablet Take 650 mg by mouth every 6 (six) hours as needed.  . Brexpiprazole (REXULTI) 1 MG TABS Take 5 mg by mouth daily.   . cetirizine (ZYRTEC) 10 MG tablet Take 10 mg by mouth daily.  . Dimethyl Fumarate (TECFIDERA) 240 MG CPDR Take 240 mg by mouth 2 (two) times daily.  Mariane Baumgarten Sodium (COLACE PO) Take by mouth as needed.  Marland Kitchen escitalopram (LEXAPRO) 20 MG tablet Take 20 mg by mouth daily.  . fesoterodine (TOVIAZ) 4 MG TB24 tablet Take 8 mg by mouth daily.   Marland Kitchen levothyroxine (SYNTHROID, LEVOTHROID) 150 MCG tablet TAKE 1 TABLET BY MOUTH DAILY BEFORE BREAKFAST.  Marland Kitchen omeprazole (PRILOSEC) 20 MG capsule Take 40 mg by mouth daily.   . traMADol (ULTRAM) 50 MG tablet Take 50 mg by mouth every 6 (six) hours as needed.  Marland Kitchen UNABLE TO FIND Vit B 12 injection-every other month  . [DISCONTINUED] levothyroxine (SYNTHROID, LEVOTHROID) 137 MCG tablet TAKE 1 TABLET BY MOUTH DAILY BEFORE BREAKFAST.  . [DISCONTINUED] Vitamin D, Ergocalciferol, (DRISDOL) 50000 units CAPS capsule TAKE 1 CAPSULE  BY MOUTH EVERY 7 DAYS.   No facility-administered encounter medications on file as of 09/13/2017.    ALLERGIES: Allergies  Allergen Reactions  . Nsaids Other (See Comments)    Pt states MD told her not to take due to stomach irritation  . Tuberculin Tests Dermatitis   VACCINATION STATUS: Immunization History  Administered Date(s) Administered  . Influenza-Unspecified 04/30/2014, 03/15/2015    HPI  52 year old female patient with medical history of hypothyroidism from Hashimoto's thyroiditis. She is on levothyroxine 137 g by mouth every morning.  -She admits to skipping her levothyroxine 1-2 times a week due to  the fact that she does not wake up early enough to take without her other medications.   -She complains of cold intolerance, constipation, fatigue.  -She has a long-standing problem with dry skin.  She also has history of prediabetes on exercise and diet.   Review of Systems   Constitutional:  + fatigue, no subjective hyperthermia, +hypothermia Eyes: no blurry vision, no xerophthalmia ENT: no sore throat, no nodules palpated in throat Cardiovascular: no CP/SOB/palpitations/leg swelling Respiratory: no cough/SOB Gastrointestinal: no N/V/D/C Musculoskeletal: no muscle/joint aches Skin: no rashes, + dry skin . Neurological: no tremors/numbness/tingling/dizziness Psychiatric: no depression/anxiety  Objective:    BP 132/88   Pulse 66   Ht 5' 4.5" (1.638 m)   Wt 202 lb (91.6 kg)   BMI 34.14 kg/m   Wt Readings from Last 3 Encounters:  09/13/17 202 lb (91.6 kg)  05/16/17 203 lb 8 oz (92.3 kg)  04/09/17 200 lb 12.8 oz (91.1 kg)    Physical Exam Constitutional: overweight, in NAD Eyes: PERRLA, EOMI, no exophthalmos ENT: Moist mucous membranes  , no thyromegaly, no cervical lymphadenopathy.   Cardiovascular: RRR, No MRG Respiratory: CTA B Gastrointestinal: abdomen soft, NT, ND, BS+ Musculoskeletal: no deformities, strength intact in all 4 Skin: + diffusely dry skin on bilateral upper and lower extremities. Neurological: no tremor with outstretched hands, DTR normal in all 4  Recent Results (from the past 2160 hour(s))  T4, free     Status: None   Collection Time: 09/04/17  2:19 PM  Result Value Ref Range   Free T4 1.0 0.8 - 1.8 ng/dL  TSH     Status: Abnormal   Collection Time: 09/04/17  2:19 PM  Result Value Ref Range   TSH 9.02 (H) mIU/L    Comment:           Reference Range .           > or = 20 Years  0.40-4.50 .                Pregnancy Ranges           First trimester    0.26-2.66           Second trimester   0.55-2.73           Third trimester    0.43-2.91        Assessment & Plan:   1. Hypothyroidism due to Hashimoto's thyroiditis - Her thyroid function tests are constant with inadequate replacement, mainly due to the fact that she skips some doses of levothyroxine.  -I will proceed to increase her levothyroxine to 150 micrograms p.o. every morning.  -I advised her to go ahead and take her thyroid hormone if and if it is late instead of skipping the whole day-on those days when she wakes up late.   - We discussed about correct intake of levothyroxine, at fasting, with  water, separated by at least 30 minutes from breakfast, and separated by more than 4 hours from calcium, iron, multivitamins, acid reflux medications (PPIs). -Patient is made aware of the fact that thyroid hormone replacement is needed for life, dose to be adjusted by periodic monitoring of thyroid function tests. - I advised her on moisturizing skin with Vaseline.  2. Pre-diabetes -Her last  A1c was 5.8%. She will not need medications for now. She is encouraged to stay active and avoid simple/processed carbohydrates. -  Suggestion is made for her to avoid simple carbohydrates  from her diet including Cakes, Sweet Desserts / Pastries, Ice Cream, Soda (diet and regular), Sweet Tea, Candies, Chips, Cookies, Store Bought Juices, Alcohol in Excess of  1-2 drinks a day, Artificial Sweeteners, and "Sugar-free" Products. This will help patient to have stable blood glucose profile and potentially avoid unintended weight gain.   3. Vitamin D deficiency -He is status post therapy with vitamin D 2 50,000 units weekly for 12 weeks.    - I advised patient to maintain close follow up with Rosita Fire, MD for primary care needs. Follow up plan: Return in about 6 months (around 03/13/2018) for follow up with pre-visit labs.  Glade Lloyd, MD Phone: [phone]  Fax: [phone]  -  This note was partially dictated with voice recognition software. Similar sounding words can be transcribed  inadequately or may not  be corrected upon review.  09/13/2017, 2:53 PM

## 2017-10-08 ENCOUNTER — Encounter: Payer: Self-pay | Admitting: Gastroenterology

## 2017-10-08 ENCOUNTER — Ambulatory Visit: Payer: Medicaid Other | Admitting: Gastroenterology

## 2017-10-08 VITALS — BP 123/76 | HR 74 | Temp 97.1°F | Ht 64.0 in | Wt 193.8 lb

## 2017-10-08 DIAGNOSIS — R1310 Dysphagia, unspecified: Secondary | ICD-10-CM

## 2017-10-08 NOTE — Patient Instructions (Signed)
Continue taking Prilosec each morning, 30 minutes before breakfast.   We have scheduled the xray of your esophagus.   Make sure you chew really well, take small bites, sit upright while eating.  We will see you in 6 months!  It was a pleasure to see you today. I strive to create trusting relationships with patients to provide genuine, compassionate, and quality care. I value your feedback. If you receive a survey regarding your visit,  I greatly appreciate you taking time to fill this out.   Annitta Needs, PhD, ANP-BC [facility]

## 2017-10-08 NOTE — Progress Notes (Signed)
cc'ed to pcp °

## 2017-10-08 NOTE — Assessment & Plan Note (Signed)
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 She is edentulous and not wearing dentures although she has at home. Occasional solid food dysphagia likely secondary to edentulous status. However, we will pursue BPE to conclude evaluation. No alarm features. Return in 6 months. Continue Prilosec daily.

## 2017-10-08 NOTE — Progress Notes (Signed)
Primary Care Physician:  Rosita Fire, MD Primary GI: Dr. Oneida Alar   Chief Complaint  Patient presents with  . Gastroesophageal Reflux  . Dysphagia    occ    HPI:   Dawn Barnes is a 52 y.o. female presenting today with a history of dysphagia likely secondary to web and/or poor dentition. Colonoscopy with mild proctitis in setting of NSAIDs. Edentulous. BPE recommended if continued dysphagia.   Still not wearing dentures. States she is so used to not wearing them. Occasional problems with tougher textures. No significant abdominal pain. Prilosec 40 mg daily. Controls reflux.   Occasional constipation. Colace as needed. No NSAIDs. Only tylenol as needed over the counter.   Lost 7 lbs since Sept 2018. Trying to eat better.    Past Medical History:  Diagnosis Date  . Anxiety   . Constipation 04/30/2013  . Depression   . Diabetes mellitus without complication (HCC)    borderline  . Fibroids, intramural 01/21/2016  . GERD (gastroesophageal reflux disease)   . Heartburn   . Hot flashes 01/11/2016  . Mixed incontinence   . MS (multiple sclerosis) ([place])   . Pancreatitis   . PONV (postoperative nausea and vomiting)   . PTSD (post-traumatic stress disorder)   . Thyroid disease   . Urge urinary incontinence 05/10/2015  . Vaginal bleeding 01/11/2016    Past Surgical History:  Procedure Laterality Date  . BIOPSY  12/07/2015   Procedure: BIOPSY;  Surgeon: Danie Binder, MD;  [facility];  Service: Endoscopy;;  right and left colon biopsies, gastric bx's   . COLONOSCOPY WITH PROPOFOL N/A 12/07/2015   Dr. Oneida Alar: mild proctitis, hyperplastic polyp   . ENDOMETRIAL ABLATION    . ESOPHAGOGASTRODUODENOSCOPY (EGD) WITH PROPOFOL N/A 12/07/2015   Dr. 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   . INCONTINENCE SURGERY    . SAVORY DILATION N/A 12/07/2015   Procedure: SAVORY DILATION;  Surgeon: Danie Binder, MD;  [facility];  Service: Endoscopy;  Laterality: N/A;  . TUBAL LIGATION      Current Outpatient Medications  Medication Sig Dispense Refill  . acetaminophen (TYLENOL) 325 MG tablet Take 650 mg by mouth every 6 (six) hours as needed.    . Brexpiprazole (REXULTI) 1 MG TABS Take 1 mg by mouth daily.     . cetirizine (ZYRTEC) 10 MG tablet Take 10 mg by mouth daily.    . Dimethyl Fumarate (TECFIDERA) 240 MG CPDR Take 240 mg by mouth 2 (two) times daily.    Mariane Baumgarten Sodium (COLACE PO) Take by mouth as needed.    Marland Kitchen escitalopram (LEXAPRO) 20 MG tablet Take 20 mg by mouth daily.    . fesoterodine (TOVIAZ) 4 MG TB24 tablet Take 8 mg by mouth daily.     Marland Kitchen levothyroxine (SYNTHROID, LEVOTHROID) 150 MCG tablet TAKE 1 TABLET BY MOUTH DAILY BEFORE BREAKFAST. 30 tablet 6  . omeprazole (PRILOSEC) 20 MG capsule Take 40 mg by mouth daily.     . traMADol (ULTRAM) 50 MG tablet Take 50 mg by mouth every 6 (six) hours as needed.    Marland Kitchen UNABLE TO FIND Vit B 12 injection-every other month     No current facility-administered medications for this visit.     Allergies as of 10/08/2017 - Review Complete 10/08/2017  Allergen Reaction Noted  . Nsaids Other (See Comments) 01/17/2016  . Tuberculin tests Dermatitis 01/18/2015    Family History  Problem Relation  Age of Onset  . Diabetes Father   . Heart disease Father   . Hypertension Paternal Aunt   . Heart disease Paternal Aunt   . Seizures Paternal Aunt   . Breast cancer Paternal Aunt   . Heart disease Paternal Grandmother   . Depression Daughter   . Hypertension Sister   . Heart disease Sister   . Depression Son   . ADD / ADHD Son   . Depression Daughter   . Colon cancer Neg Hx     Social History   Socioeconomic History  . Marital status: Legally Separated    Spouse name: None  . Number of children: None  . Years of education: None  . Highest education level: None  Social Needs  . Financial resource strain: None  . Food insecurity - worry: None  .  Food insecurity - inability: None  . Transportation needs - medical: None  . Transportation needs - non-medical: None  Occupational History  . Occupation: disability  Tobacco Use  . Smoking status: Never Smoker  . Smokeless tobacco: Never Used  Substance and Sexual Activity  . Alcohol use: No    Alcohol/week: 0.0 oz  . Drug use: No  . Sexual activity: No    Birth control/protection: Surgical    Comment: tubal and ablation  Other Topics Concern  . None  Social History Narrative  . None    Review of Systems: Gen: Denies fever, chills, anorexia. Denies fatigue, weakness, weight loss.  CV: Denies chest pain, palpitations, syncope, peripheral edema, and claudication. Resp: Denies dyspnea at rest, cough, wheezing, coughing up blood, and pleurisy. GI: see HPI  Derm: Denies rash, itching, dry skin Psych: Denies depression, anxiety, memory loss, confusion. No homicidal or suicidal ideation.  Heme: Denies bruising, bleeding, and enlarged lymph nodes.  Physical Exam: BP 123/76   Pulse 74   Temp (!) 97.1 F (36.2 C) (Oral)   Ht 5\' 4"  (1.626 m)   Wt 193 lb 12.8 oz (87.9 kg)   BMI 33.27 kg/m  General:   Alert and oriented. No distress noted. Pleasant and cooperative.  Head:  Normocephalic and atraumatic. Eyes:  Conjuctiva clear without scleral icterus. Mouth:  Oral mucosa pink and moist. Edentulous  Abdomen:  +BS, soft, non-tender and non-distended. No rebound or guarding. No HSM or masses noted. Msk:  Symmetrical without gross deformities. Normal posture. Extremities:  Without edema. Neurologic:  Alert and  oriented x4 Psych:  Alert and cooperative. Normal mood and affect.

## 2017-10-12 ENCOUNTER — Ambulatory Visit (HOSPITAL_COMMUNITY)
Admission: RE | Admit: 2017-10-12 | Discharge: 2017-10-12 | Disposition: A | Discharge status: Home / Self Care | Payer: Medicaid Other | Source: Ambulatory Visit | Attending: Gastroenterology | Admitting: Gastroenterology

## 2017-10-12 DIAGNOSIS — K224 Dyskinesia of esophagus: Secondary | ICD-10-CM | POA: Diagnosis not present

## 2017-10-12 DIAGNOSIS — R1310 Dysphagia, unspecified: Secondary | ICD-10-CM | POA: Insufficient documentation

## 2017-10-18 NOTE — Progress Notes (Signed)
Mild nonspecific dysmotility on BPE. Her dysphagia is likely related to edentulous status combined with mild dysmotility. Chew well, take small bites, sit upright while eating. No endoscopic intervention needed currently.

## 2017-10-18 NOTE — Progress Notes (Signed)
Pt is aware.  

## 2017-11-06 ENCOUNTER — Other Ambulatory Visit: Payer: Self-pay | Admitting: "Endocrinology

## 2018-03-08 LAB — T4, FREE: Free T4: 2.4 ng/dL — ABNORMAL HIGH (ref 0.8–1.8)

## 2018-03-08 LAB — TSH: TSH: 0.01 mIU/L — ABNORMAL LOW

## 2018-03-08 LAB — VITAMIN D 25 HYDROXY (VIT D DEFICIENCY, FRACTURES): Vit D, 25-Hydroxy: 37 ng/mL (ref 30–100)

## 2018-03-13 ENCOUNTER — Encounter: Payer: Self-pay | Admitting: "Endocrinology

## 2018-03-13 ENCOUNTER — Ambulatory Visit (INDEPENDENT_AMBULATORY_CARE_PROVIDER_SITE_OTHER): Payer: Medicaid Other | Admitting: "Endocrinology

## 2018-03-13 VITALS — BP 122/83 | HR 76 | Ht 64.0 in | Wt 159.0 lb

## 2018-03-13 DIAGNOSIS — E049 Nontoxic goiter, unspecified: Secondary | ICD-10-CM

## 2018-03-13 DIAGNOSIS — E038 Other specified hypothyroidism: Secondary | ICD-10-CM

## 2018-03-13 MED ORDER — LEVOTHYROXINE SODIUM 112 MCG PO TABS: ORAL_TABLET | ORAL | 6 refills | Status: DC

## 2018-03-13 NOTE — Progress Notes (Signed)
Endocrinology follow-up note  Subjective:    Patient ID: Dawn Barnes, female    DOB: 25-May-1966, PCP Rosita Fire, MD   Past Medical History:  Diagnosis Date  . Anxiety   . Constipation 04/30/2013  . Depression   . Diabetes mellitus without complication (HCC)    borderline  . Fibroids, intramural 01/21/2016  . GERD (gastroesophageal reflux disease)   . Heartburn   . Hot flashes 01/11/2016  . Mixed incontinence   . MS (multiple sclerosis) ([place])   . Pancreatitis   . PONV (postoperative nausea and vomiting)   . PTSD (post-traumatic stress disorder)   . Thyroid disease   . Urge urinary incontinence 05/10/2015  . Vaginal bleeding 01/11/2016   Past Surgical History:  Procedure Laterality Date  . BIOPSY  12/07/2015   Procedure: BIOPSY;  Surgeon: Danie Binder, MD;  [facility];  Service: Endoscopy;;  right and left colon biopsies, gastric bx's   . COLONOSCOPY WITH PROPOFOL N/A 12/07/2015   Dr. Oneida Alar: mild proctitis, hyperplastic polyp   . ENDOMETRIAL ABLATION    . ESOPHAGOGASTRODUODENOSCOPY (EGD) WITH PROPOFOL N/A 12/07/2015   Dr. 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   . INCONTINENCE SURGERY    . SAVORY DILATION N/A 12/07/2015   Procedure: SAVORY DILATION;  Surgeon: Danie Binder, MD;  [facility];  Service: Endoscopy;  Laterality: N/A;  . TUBAL LIGATION     Social History   Socioeconomic History  . Marital status: Legally Separated    Spouse name: Not on file  . Number of children: Not on file  . Years of education: Not on file  . Highest education level: Not on file  Occupational History  . Occupation: disability  Social Needs  . Financial resource strain: Not on file  . Food insecurity:    Worry: Not on file    Inability: Not on file  . Transportation needs:    Medical: Not on file    Non-medical: Not on file  Tobacco Use  . Smoking status: Never Smoker  . Smokeless tobacco: Never  Used  Substance and Sexual Activity  . Alcohol use: No    Alcohol/week: 0.0 standard drinks  . Drug use: No  . Sexual activity: Never    Birth control/protection: Surgical    Comment: tubal and ablation  Lifestyle  . Physical activity:    Days per week: Not on file    Minutes per session: Not on file  . Stress: Not on file  Relationships  . Social connections:    Talks on phone: Not on file    Gets together: Not on file    Attends religious service: Not on file    Active member of club or organization: Not on file    Attends meetings of clubs or organizations: Not on file    Relationship status: Not on file  Other Topics Concern  . Not on file  Social History Narrative  . Not on file   Outpatient Encounter Medications as of 03/13/2018  Medication Sig  . acetaminophen (TYLENOL) 325 MG tablet Take 650 mg by mouth every 6 (six) hours as needed.  . Brexpiprazole (REXULTI) 1 MG TABS Take 1 mg by mouth daily.   . cetirizine (ZYRTEC) 10 MG tablet Take 10 mg by mouth daily.  . Dimethyl Fumarate (TECFIDERA) 240 MG CPDR Take 240 mg by mouth 2 (two) times daily.  Mariane Baumgarten Sodium (COLACE PO) Take by mouth as  needed.  Marland Kitchen escitalopram (LEXAPRO) 20 MG tablet Take 20 mg by mouth daily.  . fesoterodine (TOVIAZ) 4 MG TB24 tablet Take 8 mg by mouth daily.   Marland Kitchen levothyroxine (SYNTHROID, LEVOTHROID) 112 MCG tablet TAKE 1 TABLET BY MOUTH DAILY BEFORE BREAKFAST.  Marland Kitchen omeprazole (PRILOSEC) 20 MG capsule Take 40 mg by mouth daily.   . traMADol (ULTRAM) 50 MG tablet Take 50 mg by mouth every 6 (six) hours as needed.  Marland Kitchen UNABLE TO FIND Vit B 12 injection-every other month  . Vitamin D, Ergocalciferol, (DRISDOL) 50000 units CAPS capsule TAKE 1 CAPSULE BY MOUTH EVERY 7 DAYS.  . [DISCONTINUED] levothyroxine (SYNTHROID, LEVOTHROID) 150 MCG tablet TAKE 1 TABLET BY MOUTH DAILY BEFORE BREAKFAST.   No facility-administered encounter medications on file as of 03/13/2018.    ALLERGIES: Allergies  Allergen  Reactions  . Nsaids Other (See Comments)    Pt states MD told her not to take due to stomach irritation  . Tuberculin Tests Dermatitis   VACCINATION STATUS: Immunization History  Administered Date(s) Administered  . Influenza-Unspecified 04/30/2014, 03/15/2015    HPI  52 year old female patient with medical history of hypothyroidism from Hashimoto's thyroiditis.  -She was kept on levothyroxine 150 mcg p.o. every morning during her last visit.   -In the interim, she lost 34 pounds of weight explaining that she made significant changes in her diet.   -She denies heat intolerance, palpitations, tremors, nor anxiety. -She has a long-standing problem with dry skin.  She also has history of prediabetes not on medications.     Review of Systems   Constitutional: + Weight loss, + fatigue, no subjective hyperthermia, +hypothermia Eyes: no blurry vision, no xerophthalmia ENT: no sore throat, no nodules palpated in throat Cardiovascular: no CP/SOB/palpitations/leg swelling Respiratory: no cough/SOB Gastrointestinal: no N/V/D/C Musculoskeletal: no muscle/joint aches Skin: no rashes, + dry skin . Neurological: no tremors/numbness/tingling/dizziness Psychiatric: no depression/anxiety  Objective:    BP 122/83   Pulse 76   Ht 5\' 4"  (1.626 m)   Wt 159 lb (72.1 kg)   BMI 27.29 kg/m   Wt Readings from Last 3 Encounters:  03/13/18 159 lb (72.1 kg)  10/08/17 193 lb 12.8 oz (87.9 kg)  09/13/17 202 lb (91.6 kg)    Physical Exam Constitutional:   Alert and oriented x3, not in acute distress.  Eyes: PERRLA, EOMI, no exophthalmos ENT: Moist mucous membranes  , + palpable thyromegaly, no cervical lymphadenopathy.   Cardiovascular: RRR, No MRG Respiratory: CTA B Gastrointestinal: abdomen soft, NT, ND, BS+ Musculoskeletal: no deformities, strength intact in all 4 Skin: + diffusely dry skin on bilateral upper and lower extremities. Neurological: no tremor with outstretched  hands   Recent Results (from the past 2160 hour(s))  T4, free     Status: Abnormal   Collection Time: 03/07/18  3:27 PM  Result Value Ref Range   Free T4 2.4 (H) 0.8 - 1.8 ng/dL  TSH     Status: Abnormal   Collection Time: 03/07/18  3:27 PM  Result Value Ref Range   TSH 0.01 (L) mIU/L    Comment:           Reference Range .           > or = 20 Years  0.40-4.50 .                Pregnancy Ranges           First trimester    0.26-2.66  Second trimester   0.55-2.73           Third trimester    0.43-2.91   VITAMIN D 25 Hydroxy (Vit-D Deficiency, Fractures)     Status: None   Collection Time: 03/07/18  3:27 PM  Result Value Ref Range   Vit D, 25-Hydroxy 37 30 - 100 ng/mL    Comment: Vitamin D Status         25-OH Vitamin D: . Deficiency:                    <20 ng/mL Insufficiency:             20 - 29 ng/mL Optimal:                 > or = 30 ng/mL . For 25-OH Vitamin D testing on patients on  D2-supplementation and patients for whom quantitation  of D2 and D3 fractions is required, the QuestAssureD(TM) 25-OH VIT D, (D2,D3), LC/MS/MS is recommended: order  code [id number] (patients >33yrs). . For more information on this test, go to: http://education.questdiagnostics.com/faq/FAQ163 (This link is being provided for  informational/educational purposes only.)       Assessment & Plan:   1. Hypothyroidism due to Hashimoto's thyroiditis -Due to her interval intentional significant weight loss, her thyroid function tests are consistent with over replacement.    -I discussed and lowered her levothyroxine to 112 mcg p.o. every morning.     - We discussed about correct intake of levothyroxine, at fasting, with water, separated by at least 30 minutes from breakfast, and separated by more than 4 hours from calcium, iron, multivitamins, acid reflux medications (PPIs). -Patient is made aware of the fact that thyroid hormone replacement is needed for life, dose to be adjusted by  periodic monitoring of thyroid function tests.    - I advised her on moisturizing skin with Vaseline.  2. Pre-diabetes -Her last  A1c was 5.8%. She will not need medications for now. She is encouraged to stay active and avoid simple/processed carbohydrates. -  Suggestion is made for her to avoid simple carbohydrates  from her diet including Cakes, Sweet Desserts / Pastries, Ice Cream, Soda (diet and regular), Sweet Tea, Candies, Chips, Cookies, Store Bought Juices, Alcohol in Excess of  1-2 drinks a day, Artificial Sweeteners, and "Sugar-free" Products. This will help patient to have stable blood glucose profile and potentially avoid unintended weight gain.  3. Vitamin D deficiency -Her 25-hydroxy vitamin D is corrected to 37 status post treatment with vitamin D2 50,000 units for 12 weeks.    - I advised patient to maintain close follow up with Rosita Fire, MD for primary care needs. Follow up plan: Return in about 3 months (around 06/13/2018) for Follow up with Pre-visit Labs, Thyroid / Neck Ultrasound.  Glade Lloyd, MD Phone: [phone]  Fax: [phone]  -  This note was partially dictated with voice recognition software. Similar sounding words can be transcribed inadequately or may not  be corrected upon review.  03/13/2018, 5:13 PM

## 2018-03-22 ENCOUNTER — Ambulatory Visit (HOSPITAL_COMMUNITY): Payer: Medicaid Other

## 2018-04-10 ENCOUNTER — Ambulatory Visit: Payer: Medicaid Other | Admitting: Gastroenterology

## 2018-05-27 ENCOUNTER — Other Ambulatory Visit (HOSPITAL_COMMUNITY): Payer: Self-pay | Admitting: Neurology

## 2018-05-27 DIAGNOSIS — G35 Multiple sclerosis: Secondary | ICD-10-CM

## 2018-06-03 ENCOUNTER — Ambulatory Visit (HOSPITAL_COMMUNITY)
Admission: RE | Admit: 2018-06-03 | Discharge: 2018-06-03 | Disposition: A | Discharge status: Home / Self Care | Payer: Medicaid Other | Source: Ambulatory Visit | Attending: Neurology | Admitting: Neurology

## 2018-06-03 DIAGNOSIS — G35 Multiple sclerosis: Secondary | ICD-10-CM | POA: Diagnosis not present

## 2018-06-04 ENCOUNTER — Ambulatory Visit (HOSPITAL_COMMUNITY): Payer: Medicaid Other

## 2018-06-07 ENCOUNTER — Ambulatory Visit (HOSPITAL_COMMUNITY)
Admission: RE | Admit: 2018-06-07 | Discharge: 2018-06-07 | Disposition: A | Discharge status: Home / Self Care | Payer: Medicaid Other | Source: Ambulatory Visit | Attending: "Endocrinology | Admitting: "Endocrinology

## 2018-06-07 DIAGNOSIS — E038 Other specified hypothyroidism: Secondary | ICD-10-CM

## 2018-06-07 DIAGNOSIS — E041 Nontoxic single thyroid nodule: Secondary | ICD-10-CM | POA: Insufficient documentation

## 2018-06-07 DIAGNOSIS — E039 Hypothyroidism, unspecified: Secondary | ICD-10-CM | POA: Insufficient documentation

## 2018-06-07 DIAGNOSIS — Z79890 Hormone replacement therapy (postmenopausal): Secondary | ICD-10-CM | POA: Diagnosis not present

## 2018-06-11 LAB — TSH: TSH: 0.1 m[IU]/L — AB

## 2018-06-11 LAB — T4, FREE: Free T4: 1.1 ng/dL (ref 0.8–1.8)

## 2018-06-19 ENCOUNTER — Ambulatory Visit (INDEPENDENT_AMBULATORY_CARE_PROVIDER_SITE_OTHER): Payer: Medicaid Other | Admitting: "Endocrinology

## 2018-06-19 ENCOUNTER — Encounter: Payer: Self-pay | Admitting: "Endocrinology

## 2018-06-19 VITALS — BP 138/80 | HR 67 | Ht 64.0 in | Wt 144.0 lb

## 2018-06-19 DIAGNOSIS — R7303 Prediabetes: Secondary | ICD-10-CM

## 2018-06-19 DIAGNOSIS — E038 Other specified hypothyroidism: Secondary | ICD-10-CM

## 2018-06-19 MED ORDER — LEVOTHYROXINE SODIUM 100 MCG PO TABS: ORAL_TABLET | ORAL | 3 refills | Status: DC

## 2018-06-19 NOTE — Progress Notes (Signed)
Endocrinology follow-up note  Subjective:    Patient ID: Dawn Barnes, female    DOB: 1966/04/04, PCP Rosita Fire, MD   Past Medical History:  Diagnosis Date  . Anxiety   . Constipation 04/30/2013  . Depression   . Diabetes mellitus without complication (HCC)    borderline  . Fibroids, intramural 01/21/2016  . GERD (gastroesophageal reflux disease)   . Heartburn   . Hot flashes 01/11/2016  . Mixed incontinence   . MS (multiple sclerosis) ([place])   . Pancreatitis   . PONV (postoperative nausea and vomiting)   . PTSD (post-traumatic stress disorder)   . Thyroid disease   . Urge urinary incontinence 05/10/2015  . Vaginal bleeding 01/11/2016   Past Surgical History:  Procedure Laterality Date  . BIOPSY  12/07/2015   Procedure: BIOPSY;  Surgeon: Danie Binder, MD;  [facility];  Service: Endoscopy;;  right and left colon biopsies, gastric bx's   . COLONOSCOPY WITH PROPOFOL N/A 12/07/2015   Dr. Oneida Alar: mild proctitis, hyperplastic polyp   . ENDOMETRIAL ABLATION    . ESOPHAGOGASTRODUODENOSCOPY (EGD) WITH PROPOFOL N/A 12/07/2015   Dr. 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   . INCONTINENCE SURGERY    . SAVORY DILATION N/A 12/07/2015   Procedure: SAVORY DILATION;  Surgeon: Danie Binder, MD;  [facility];  Service: Endoscopy;  Laterality: N/A;  . TUBAL LIGATION     Social History   Socioeconomic History  . Marital status: Legally Separated    Spouse name: Not on file  . Number of children: Not on file  . Years of education: Not on file  . Highest education level: Not on file  Occupational History  . Occupation: disability  Social Needs  . Financial resource strain: Not on file  . Food insecurity:    Worry: Not on file    Inability: Not on file  . Transportation needs:    Medical: Not on file    Non-medical: Not on file  Tobacco Use  . Smoking status: Never Smoker  . Smokeless tobacco: Never  Used  Substance and Sexual Activity  . Alcohol use: No    Alcohol/week: 0.0 standard drinks  . Drug use: No  . Sexual activity: Never    Birth control/protection: Surgical    Comment: tubal and ablation  Lifestyle  . Physical activity:    Days per week: Not on file    Minutes per session: Not on file  . Stress: Not on file  Relationships  . Social connections:    Talks on phone: Not on file    Gets together: Not on file    Attends religious service: Not on file    Active member of club or organization: Not on file    Attends meetings of clubs or organizations: Not on file    Relationship status: Not on file  Other Topics Concern  . Not on file  Social History Narrative  . Not on file   Outpatient Encounter Medications as of 51/20/2019  Medication Sig  . acetaminophen (TYLENOL) 325 MG tablet Take 650 mg by mouth every 6 (six) hours as needed.  . Brexpiprazole (REXULTI) 1 MG TABS Take 1 mg by mouth daily.   . cetirizine (ZYRTEC) 10 MG tablet Take 10 mg by mouth daily.  . Dimethyl Fumarate (TECFIDERA) 240 MG CPDR Take 240 mg by mouth 2 (two) times daily.  Mariane Baumgarten Sodium (COLACE PO) Take by mouth as  needed.  Marland Kitchen escitalopram (LEXAPRO) 20 MG tablet Take 20 mg by mouth daily.  . fesoterodine (TOVIAZ) 4 MG TB24 tablet Take 8 mg by mouth daily.   Marland Kitchen levothyroxine (SYNTHROID, LEVOTHROID) 100 MCG tablet TAKE 1 TABLET BY MOUTH DAILY BEFORE BREAKFAST.  Marland Kitchen omeprazole (PRILOSEC) 20 MG capsule Take 40 mg by mouth daily.   . traMADol (ULTRAM) 50 MG tablet Take 50 mg by mouth every 6 (six) hours as needed.  Marland Kitchen UNABLE TO FIND Vit B 12 injection-every other month  . Vitamin D, Ergocalciferol, (DRISDOL) 50000 units CAPS capsule TAKE 1 CAPSULE BY MOUTH EVERY 7 DAYS.  . [DISCONTINUED] levothyroxine (SYNTHROID, LEVOTHROID) 112 MCG tablet TAKE 1 TABLET BY MOUTH DAILY BEFORE BREAKFAST.   No facility-administered encounter medications on file as of 51/20/2019.    ALLERGIES: Allergies  Allergen  Reactions  . Nsaids Other (See Comments)    Pt states MD told her not to take due to stomach irritation  . Tuberculin Tests Dermatitis   VACCINATION STATUS: Immunization History  Administered Date(s) Administered  . Influenza-Unspecified 04/30/2014, 03/15/2015    HPI  52 year old female patient with medical history of hypothyroidism from Hashimoto's thyroiditis.  -She was kept on levothyroxine 112 mcg p.o. every morning during her last visit.   -In the interim, she to need to lose weight, claims to be intentional by changing her diet.   -She denies heat intolerance, palpitations, tremors, nor anxiety. -She has a long-standing problem with dry skin.  She also has history of prediabetes not on medications.     Review of Systems   Constitutional: + Weight loss, + fatigue, no subjective hyperthermia, +hypothermia Eyes: no blurry vision, no xerophthalmia ENT: no sore throat, no nodules palpated in throat Cardiovascular: no CP/SOB/palpitations/leg swelling Musculoskeletal: no muscle/joint aches Skin: no rashes, + dry skin . Neurological: no tremors/numbness/tingling/dizziness Psychiatric: no depression/anxiety  Objective:    BP 138/80   Pulse 67   Ht 5\' 4"  (1.626 m)   Wt 144 lb (65.3 kg)   BMI 24.72 kg/m   Wt Readings from Last 3 Encounters:  51/20/19 144 lb (65.3 kg)  03/13/18 159 lb (72.1 kg)  10/08/17 193 lb 12.8 oz (87.9 kg)    Physical Exam Constitutional:   Alert and oriented x3, not in acute distress.  Eyes: PERRLA, EOMI, no exophthalmos ENT: Moist mucous membranes  , + palpable thyromegaly, no cervical lymphadenopathy.   Cardiovascular: RRR, No MRG Respiratory: CTA B Gastrointestinal: abdomen soft, NT, ND, BS+ Musculoskeletal: no deformities, strength intact in all 4 Skin: + diffusely dry skin on bilateral upper and lower extremities. Neurological: no tremor with outstretched hands   Recent Results (from the past 2160 hour(s))  TSH     Status: Abnormal    Collection Time: 06/11/18  1:57 PM  Result Value Ref Range   TSH 0.10 (L) mIU/L    Comment:           Reference Range .           > or = 20 Years  0.40-4.50 .                Pregnancy Ranges           First trimester    0.26-2.66           Second trimester   0.55-2.73           Third trimester    0.43-2.91   T4, free     Status: None   Collection Time: 06/11/18  1:57 PM  Result Value Ref Range   Free T4 1.1 0.8 - 1.8 ng/dL      Assessment & Plan:   1. Hypothyroidism due to Hashimoto's thyroiditis -Due to her continued weight loss, her thyroid hormone requirement continues to drop. -I discussed and lowered her levothyroxine to 100 mcg p.o. every morning.    - - We discussed about correct intake of levothyroxine, at fasting, with water, separated by at least 30 minutes from breakfast, and separated by more than 4 hours from calcium, iron, multivitamins, acid reflux medications (PPIs). -Patient is made aware of the fact that thyroid hormone replacement is needed for life, dose to be adjusted by periodic monitoring of thyroid function tests. - I advised her on moisturizing skin with Vaseline.  2. Pre-diabetes -Her last  A1c was 5.8%. She will not require medication intervention at this time.  Weight loss should be helping.  She is encouraged to stay active and avoid simple/processed carbohydrates. -  Suggestion is made for her to avoid simple carbohydrates  from her diet including Cakes, Sweet Desserts / Pastries, Ice Cream, Soda (diet and regular), Sweet Tea, Candies, Chips, Cookies, Store Bought Juices, Alcohol in Excess of  1-2 drinks a day, Artificial Sweeteners, and "Sugar-free" Products. This will help patient to have stable blood glucose profile and potentially avoid unintended weight gain.   3. Vitamin D deficiency -Her 25-hydroxy vitamin D is corrected to 37 status post treatment with vitamin D2 50,000 units for 12 weeks.    - I advised patient to maintain close follow up  with Rosita Fire, MD for primary care needs. Follow up plan: Return in about 3 months (around 09/19/2018) for Follow up with Pre-visit Labs.  Glade Lloyd, MD Phone: [phone]  Fax: [phone]  -  This note was partially dictated with voice recognition software. Similar sounding words can be transcribed inadequately or may not  be corrected upon review.  06/19/2018, 5:10 PM

## 2018-07-08 ENCOUNTER — Other Ambulatory Visit (HOSPITAL_COMMUNITY): Payer: Self-pay | Admitting: Internal Medicine

## 2018-07-08 DIAGNOSIS — Z1231 Encounter for screening mammogram for malignant neoplasm of breast: Secondary | ICD-10-CM

## 2018-07-17 ENCOUNTER — Ambulatory Visit (HOSPITAL_COMMUNITY)
Admission: RE | Admit: 2018-07-17 | Discharge: 2018-07-17 | Disposition: A | Discharge status: Home / Self Care | Payer: Medicaid Other | Source: Ambulatory Visit | Attending: Internal Medicine | Admitting: Internal Medicine

## 2018-07-17 DIAGNOSIS — Z1231 Encounter for screening mammogram for malignant neoplasm of breast: Secondary | ICD-10-CM | POA: Insufficient documentation

## 2018-07-19 ENCOUNTER — Telehealth: Payer: Self-pay | Admitting: *Deleted

## 2018-07-19 ENCOUNTER — Encounter: Payer: Self-pay | Admitting: Gastroenterology

## 2018-07-19 ENCOUNTER — Ambulatory Visit (INDEPENDENT_AMBULATORY_CARE_PROVIDER_SITE_OTHER): Payer: Medicaid Other | Admitting: Gastroenterology

## 2018-07-19 ENCOUNTER — Encounter: Payer: Self-pay | Admitting: *Deleted

## 2018-07-19 VITALS — BP 132/82 | HR 73 | Temp 97.1°F | Ht 64.0 in | Wt 144.2 lb

## 2018-07-19 DIAGNOSIS — R634 Abnormal weight loss: Secondary | ICD-10-CM | POA: Diagnosis not present

## 2018-07-19 DIAGNOSIS — R1310 Dysphagia, unspecified: Secondary | ICD-10-CM | POA: Diagnosis not present

## 2018-07-19 NOTE — Progress Notes (Signed)
Referring Provider: Rosita Fire, MD Primary Care Physician:  Rosita Fire, MD  Primary GI: Dr. Oneida Alar  Chief Complaint  Patient presents with  . Gastroesophageal Reflux    doing fine  . Dysphagia    occas will have an issue but not often    HPI:   Dawn Barnes is a 52 y.o. female presenting today with a history of today with a history of dysphagia likely secondary to web and/or poor dentition. Colonoscopy with mild proctitis in setting of NSAIDs. Edentulous. BPE recommended if continued dysphagia. 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 She is edentulous and not wearing dentures although she has at home. Still not wearing them. Didn't like the way it fit.   BPE March 2019 IMPRESSION: 1. Nonspecific, mild dysmotility with proximal escape seen at the level of the lower third esophagus. 2. No obstructive process or lesion. 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  Prilosec 40 mg daily. Controlling reflux. Has lost from 193 to 144 since March 2019. States intake is sometimes difficult due to edentulous status. States she doesn't eat 3 meals a day at times. Cut back on chips, ice cream, sweets. States she used to eat a lot of them.    History of hypothyroidism from Hashimoto's thyroiditis, followed by Dr. Dorris Fetch. No rectal bleeding. No abdominal pain. Recent blood work with Dr. Legrand Rams per patient. Stated everything looked good except TSH.   Past Medical History:  Diagnosis Date  . Anxiety   . Constipation 04/30/2013  . Depression   . Diabetes mellitus without complication (HCC)    borderline  . Fibroids, intramural 01/21/2016  . GERD (gastroesophageal reflux disease)   . Heartburn   . Hot flashes 01/11/2016  . Mixed incontinence   . MS (multiple sclerosis) ([place])   . Pancreatitis   . PONV (postoperative nausea and vomiting)   . PTSD (post-traumatic stress disorder)   .  Thyroid disease   . Urge urinary incontinence 05/10/2015  . Vaginal bleeding 01/11/2016    Past Surgical History:  Procedure Laterality Date  . BIOPSY  12/07/2015   Procedure: BIOPSY;  Surgeon: Danie Binder, MD;  [facility];  Service: Endoscopy;;  right and left colon biopsies, gastric bx's   . COLONOSCOPY WITH PROPOFOL N/A 12/07/2015   Dr. Oneida Alar: mild proctitis, hyperplastic polyp   . ENDOMETRIAL ABLATION    . ESOPHAGOGASTRODUODENOSCOPY (EGD) WITH PROPOFOL N/A 12/07/2015   Dr. 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   . INCONTINENCE SURGERY    . SAVORY DILATION N/A 12/07/2015   Procedure: SAVORY DILATION;  Surgeon: Danie Binder, MD;  [facility];  Service: Endoscopy;  Laterality: N/A;  . TUBAL LIGATION      Current Outpatient Medications  Medication Sig Dispense Refill  . acetaminophen (TYLENOL) 325 MG tablet Take 650 mg by mouth every 6 (six) hours as needed.    . cetirizine (ZYRTEC) 10 MG tablet Take 10 mg by mouth daily.    . Dimethyl Fumarate (TECFIDERA) 240 MG CPDR Take 240 mg by mouth 2 (two) times daily.    Mariane Baumgarten Sodium (COLACE PO) Take by mouth as needed.    Marland Kitchen escitalopram (LEXAPRO) 20 MG tablet Take 20 mg by mouth daily.    . fesoterodine (TOVIAZ) 4 MG TB24 tablet Take 8 mg by mouth daily.     Marland Kitchen levothyroxine (  SYNTHROID, LEVOTHROID) 100 MCG tablet TAKE 1 TABLET BY MOUTH DAILY BEFORE BREAKFAST. 30 tablet 3  . omeprazole (PRILOSEC) 20 MG capsule Take 40 mg by mouth daily.     . traMADol (ULTRAM) 50 MG tablet Take 50 mg by mouth every 6 (six) hours as needed.    Marland Kitchen UNABLE TO FIND Vit B 12 injection-every  month    . Vitamin D, Ergocalciferol, (DRISDOL) 50000 units CAPS capsule TAKE 1 CAPSULE BY MOUTH EVERY 7 DAYS. 4 capsule 2   No current facility-administered medications for this visit.     Allergies as of 07/19/2018 - Review Complete 07/19/2018  Allergen Reaction Noted  . Nsaids Other (See  Comments) 01/17/2016  . Tuberculin tests Dermatitis 01/18/2015    Family History  Problem Relation Age of Onset  . Diabetes Father   . Heart disease Father   . Hypertension Paternal Aunt   . Heart disease Paternal Aunt   . Seizures Paternal Aunt   . Breast cancer Paternal Aunt   . Heart disease Paternal Grandmother   . Depression Daughter   . Hypertension Sister   . Heart disease Sister   . Depression Son   . ADD / ADHD Son   . Depression Daughter   . Colon cancer Neg Hx     Social History   Socioeconomic History  . Marital status: Legally Separated    Spouse name: Not on file  . Number of children: Not on file  . Years of education: Not on file  . Highest education level: Not on file  Occupational History  . Occupation: disability  Social Needs  . Financial resource strain: Not on file  . Food insecurity:    Worry: Not on file    Inability: Not on file  . Transportation needs:    Medical: Not on file    Non-medical: Not on file  Tobacco Use  . Smoking status: Never Smoker  . Smokeless tobacco: Never Used  Substance and Sexual Activity  . Alcohol use: No    Alcohol/week: 0.0 standard drinks  . Drug use: No  . Sexual activity: Never    Birth control/protection: Surgical    Comment: tubal and ablation  Lifestyle  . Physical activity:    Days per week: Not on file    Minutes per session: Not on file  . Stress: Not on file  Relationships  . Social connections:    Talks on phone: Not on file    Gets together: Not on file    Attends religious service: Not on file    Active member of club or organization: Not on file    Attends meetings of clubs or organizations: Not on file    Relationship status: Not on file  Other Topics Concern  . Not on file  Social History Narrative  . Not on file    Review of Systems: Gen: Denies fever, chills, anorexia. Denies fatigue, weakness, weight loss.  CV: Denies chest pain, palpitations, syncope, peripheral edema, and  claudication. Resp: Denies dyspnea at rest, cough, wheezing, coughing up blood, and pleurisy. GI: see HPI. Derm: Denies rash, itching, dry skin Psych: Denies depression, anxiety, memory loss, confusion. No homicidal or suicidal ideation.  Heme: Denies bruising, bleeding, and enlarged lymph nodes.  Physical Exam: BP 132/82   Pulse 73   Temp (!) 97.1 F (36.2 C) (Oral)   Ht 5\' 4"  (1.626 m)   Wt 144 lb 3.2 oz (65.4 kg)   BMI 24.75 kg/m  General:  Alert and oriented. No distress noted. Pleasant and cooperative.  Head:  Normocephalic and atraumatic. Eyes:  Conjuctiva clear without scleral icterus. Mouth:  Oral mucosa pink and moist.  Abdomen:  +BS, soft, palpable mass-like area in LLQ, difficult to ascertain exact size and shape but seems to be about size of golf ball, deep in LLQ.  Msk:  Symmetrical without gross deformities. Normal posture. Extremities:  Without edema. Neurologic:  Alert and  oriented x4 Psych:  Alert and cooperative. Normal mood and affect.

## 2018-07-19 NOTE — Patient Instructions (Signed)
I have ordered a CT scan in the near future.  Continue to chew well, make sure not to skip meals. I would like to see you back in 2 months to make sure your weight is stable.  Have a good holiday season!  I enjoyed seeing you again today! As you know, I value our relationship and want to provide genuine, compassionate, and quality care. I welcome your feedback. If you receive a survey regarding your visit,  I greatly appreciate you taking time to fill this out. See you next time!  Annitta Needs, PhD, ANP-BC [facility]

## 2018-07-19 NOTE — Assessment & Plan Note (Signed)
52 year old with history of dysphagia, improved and stable currently. Edentulous status playing a role. BPE with mild dysmotility. EGD on file from 2017. Continue Prilosec daily.

## 2018-07-19 NOTE — Telephone Encounter (Signed)
PA for CT abd/pelvis w/ contrast was approved via [employer] website. Auth# [id number] dates 07/19/18-08/18/18

## 2018-07-19 NOTE — Assessment & Plan Note (Signed)
Significant weight loss from 193 to 144 from March 2019 that is unintentional. Although she notes skipping meals at times and changing her diet to avoid sweets, I doubt this completely accounts for her notable weight loss. No alarming subjective findings; however, I do note a deep palpable mass in LLQ. Non-tender. Due to weight loss and now physical findings, pursuing Ct abd/pelvis within the next few days, likely next week. Close follow-up as already planned. Further recommendations to follow.

## 2018-07-22 NOTE — Progress Notes (Signed)
cc'd to pcp 

## 2018-07-26 ENCOUNTER — Encounter (HOSPITAL_COMMUNITY): Payer: Self-pay

## 2018-07-26 ENCOUNTER — Ambulatory Visit (HOSPITAL_COMMUNITY)
Admission: RE | Admit: 2018-07-26 | Discharge: 2018-07-26 | Disposition: A | Discharge status: Home / Self Care | Payer: Medicaid Other | Source: Ambulatory Visit | Attending: Gastroenterology | Admitting: Gastroenterology

## 2018-07-26 DIAGNOSIS — R634 Abnormal weight loss: Secondary | ICD-10-CM | POA: Insufficient documentation

## 2018-07-26 MED ORDER — IOPAMIDOL (ISOVUE-300) INJECTION 61%
100.0000 mL | Freq: Once | INTRAVENOUS | Status: AC | PRN
  Administered 2018-07-26: 100 mL via INTRAVENOUS

## 2018-07-29 ENCOUNTER — Other Ambulatory Visit: Payer: Self-pay | Admitting: "Endocrinology

## 2018-07-29 NOTE — Progress Notes (Signed)
CT completed due to concern for LLQ mass. She has a large stool burden. Gallbladder with gallstones, but she is asymptomatic and this is an incidental finding. She has uterine fibroids. Recommend Miralax daily as needed for BM. Keep upcoming appt.

## 2018-09-12 LAB — TSH: TSH: 0.02 mIU/L — ABNORMAL LOW

## 2018-09-12 LAB — T4, FREE: Free T4: 1.7 ng/dL (ref 0.8–1.8)

## 2018-09-19 ENCOUNTER — Encounter: Payer: Self-pay | Admitting: "Endocrinology

## 2018-09-19 ENCOUNTER — Ambulatory Visit (INDEPENDENT_AMBULATORY_CARE_PROVIDER_SITE_OTHER): Payer: Medicaid Other | Admitting: "Endocrinology

## 2018-09-19 VITALS — BP 131/84 | HR 73 | Ht 64.0 in | Wt 136.0 lb

## 2018-09-19 DIAGNOSIS — R7303 Prediabetes: Secondary | ICD-10-CM

## 2018-09-19 DIAGNOSIS — E038 Other specified hypothyroidism: Secondary | ICD-10-CM

## 2018-09-19 MED ORDER — LEVOTHYROXINE SODIUM 88 MCG PO TABS: ORAL_TABLET | ORAL | 3 refills | Status: DC

## 2018-09-19 NOTE — Progress Notes (Signed)
Endocrinology follow-up note  Subjective:    Patient ID: Dawn Barnes, female    DOB: 1965-12-29, PCP Rosita Fire, MD   Past Medical History:  Diagnosis Date  . Anxiety   . Constipation 04/30/2013  . Depression   . Diabetes mellitus without complication (HCC)    borderline  . Fibroids, intramural 01/21/2016  . GERD (gastroesophageal reflux disease)   . Heartburn   . Hot flashes 01/11/2016  . Mixed incontinence   . MS (multiple sclerosis) ([place])   . Pancreatitis   . PONV (postoperative nausea and vomiting)   . PTSD (post-traumatic stress disorder)   . Thyroid disease   . Urge urinary incontinence 05/10/2015  . Vaginal bleeding 01/11/2016   Past Surgical History:  Procedure Laterality Date  . BIOPSY  12/07/2015   Procedure: BIOPSY;  Surgeon: Danie Binder, MD;  [facility]: [facility];  Service: Endoscopy;;  right and left colon biopsies, gastric bx's   . COLONOSCOPY WITH PROPOFOL N/A 12/07/2015   Dr. Oneida Alar: mild proctitis, hyperplastic polyp   . ENDOMETRIAL ABLATION    . ESOPHAGOGASTRODUODENOSCOPY (EGD) WITH PROPOFOL N/A 12/07/2015   Dr. 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   . INCONTINENCE SURGERY    . SAVORY DILATION N/A 12/07/2015   Procedure: SAVORY DILATION;  Surgeon: Danie Binder, MD;  Location: [facility];  Service: Endoscopy;  Laterality: N/A;  . TUBAL LIGATION     Social History   Socioeconomic History  . Marital status: Legally Separated    Spouse name: Not on file  . Number of children: Not on file  . Years of education: Not on file  . Highest education level: Not on file  Occupational History  . Occupation: disability  Social Needs  . Financial resource strain: Not on file  . Food insecurity:    Worry: Not on file    Inability: Not on file  . Transportation needs:    Medical: Not on file    Non-medical: Not on file  Tobacco Use  . Smoking status: Never Smoker  . Smokeless tobacco: Never  Used  Substance and Sexual Activity  . Alcohol use: No    Alcohol/week: 0.0 standard drinks  . Drug use: No  . Sexual activity: Never    Birth control/protection: Surgical    Comment: tubal and ablation  Lifestyle  . Physical activity:    Days per week: Not on file    Minutes per session: Not on file  . Stress: Not on file  Relationships  . Social connections:    Talks on phone: Not on file    Gets together: Not on file    Attends religious service: Not on file    Active member of club or organization: Not on file    Attends meetings of clubs or organizations: Not on file    Relationship status: Not on file  Other Topics Concern  . Not on file  Social History Narrative  . Not on file   Outpatient Encounter Medications as of 09/19/2018  Medication Sig  . acetaminophen (TYLENOL) 325 MG tablet Take 650 mg by mouth every 6 (six) hours as needed.  . cetirizine (ZYRTEC) 10 MG tablet Take 10 mg by mouth daily.  . Dimethyl Fumarate (TECFIDERA) 240 MG CPDR Take 240 mg by mouth 2 (two) times daily.  Mariane Baumgarten Sodium (COLACE PO) Take by mouth as needed.  Marland Kitchen escitalopram (LEXAPRO) 20 MG tablet Take 20 mg by mouth daily.  Marland Kitchen  fesoterodine (TOVIAZ) 4 MG TB24 tablet Take 8 mg by mouth daily.   Marland Kitchen levothyroxine (SYNTHROID, LEVOTHROID) 88 MCG tablet TAKE 1 TABLET BY MOUTH DAILY BEFORE BREAKFAST.  Marland Kitchen omeprazole (PRILOSEC) 20 MG capsule Take 40 mg by mouth daily.   . traMADol (ULTRAM) 50 MG tablet Take 50 mg by mouth every 6 (six) hours as needed.  Marland Kitchen UNABLE TO FIND Vit B 12 injection-every  month  . Vitamin D, Ergocalciferol, (DRISDOL) 1.25 MG (50000 UT) CAPS capsule TAKE 1 CAPSULE BY MOUTH EVERY 7 DAYS.  . [DISCONTINUED] levothyroxine (SYNTHROID, LEVOTHROID) 100 MCG tablet TAKE 1 TABLET BY MOUTH DAILY BEFORE BREAKFAST.   No facility-administered encounter medications on file as of 09/19/2018.    ALLERGIES: Allergies  Allergen Reactions  . Nsaids Other (See Comments)    Pt states MD told her  not to take due to stomach irritation  . Tuberculin Tests Dermatitis   VACCINATION STATUS: Immunization History  Administered Date(s) Administered  . Influenza-Unspecified 04/30/2014, 03/15/2015    HPI  53 year old female patient with medical history of hypothyroidism from Hashimoto's thyroiditis.  -She was kept on levothyroxine 100 mcg p.o. daily a.m. during her last visit.  She continued to lose weight, claims to be intentional  by changing her diet.   -She denies heat intolerance, palpitations, tremors, nor anxiety. -She has a long-standing problem with dry skin, still not using Vaseline or any moisturizer on a regular basis.  She also has history of prediabetes not on medications.     Review of Systems   Constitutional: + Weight loss, + fatigue, no subjective hyperthermia, +hypothermia Eyes: no blurry vision, no xerophthalmia ENT: no sore throat, no nodules palpated in throat Cardiovascular: no CP/SOB/palpitations/leg swelling Musculoskeletal: no muscle/joint aches Skin: no rashes, + dry skin . Neurological: no tremors/numbness/tingling/dizziness Psychiatric: no depression/anxiety  Objective:    BP 131/84   Pulse 73   Ht 5\' 4"  (1.626 m)   Wt 136 lb (61.7 kg)   BMI 23.34 kg/m   Wt Readings from Last 3 Encounters:  09/19/18 136 lb (61.7 kg)  07/19/18 144 lb 3.2 oz (65.4 kg)  06/19/18 144 lb (65.3 kg)    Physical Exam Constitutional:   Alert and oriented x3, not in acute distress.  Eyes: PERRLA, EOMI, no exophthalmos ENT: Moist mucous membranes  , + palpable thyromegaly, no cervical lymphadenopathy.   Musculoskeletal: no deformities, strength intact in all 4 Skin: + diffusely dry skin on bilateral upper and lower extremities. Neurological: no tremor with outstretched hands   Recent Results (from the past 2160 hour(s))  TSH     Status: Abnormal   Collection Time: 09/12/18 11:33 AM  Result Value Ref Range   TSH 0.02 (L) mIU/L    Comment:           Reference  Range .           > or = 20 Years  0.40-4.50 .                Pregnancy Ranges           First trimester    0.26-2.66           Second trimester   0.55-2.73           Third trimester    0.43-2.91   T4, free     Status: None   Collection Time: 09/12/18 11:33 AM  Result Value Ref Range   Free T4 1.7 0.8 - 1.8 ng/dL  Assessment & Plan:   1. Hypothyroidism due to Hashimoto's thyroiditis -Due to her continued weight loss, her thyroid hormone requirement continues to drop. -Her previsit thyroid function tests are consistent with over replacement.  I discussed and decreased her levothyroxine to 88 mcg p.o. before breakfast.       - We discussed about the correct intake of her thyroid hormone, on empty stomach at fasting, with water, separated by at least 30 minutes from breakfast and other medications,  and separated by more than 4 hours from calcium, iron, multivitamins, acid reflux medications (PPIs). -Patient is made aware of the fact that thyroid hormone replacement is needed for life, dose to be adjusted by periodic monitoring of thyroid function tests.  - I advised her on moisturizing skin with Vaseline.  2. Pre-diabetes -Her last  A1c was 5.8%. She will not require medication intervention at this time.  Weight loss should be helping.  She is encouraged to stay active and avoid simple/processed carbohydrates.   - I advised patient to maintain close follow up with Rosita Fire, MD for primary care needs. Follow up plan: Return in about 4 months (around 01/18/2019) for Follow up with Pre-visit Labs.  Glade Lloyd, MD Phone: [phone]  Fax: [phone]  -  This note was partially dictated with voice recognition software. Similar sounding words can be transcribed inadequately or may not  be corrected upon review.  09/19/2018, 3:09 PM

## 2018-10-08 NOTE — Progress Notes (Signed)
Referring Provider: Rosita Fire, MD Primary Care Physician:  Rosita Fire, MD  Primary GI: Dr. Oneida Alar   Chief Complaint  Patient presents with  . Weight Loss    f/u    HPI:   Dawn Barnes is a 53 y.o. female presenting today with a history of dysphagia likely secondary to web and/or poor dentition. Colonoscopy with mild proctitis in setting of NSAIDs. 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 Due to LLQ "mass" felt with palpation, CT was completed Dec 2019. This showed large stool burden, gallstones, uterine fibroids. Today weight is 141.   Sees Dr. Dorris Fetch. TSH low at 0.10 in Nov 2019. Feb 2020 TSH 0.02. Dr. Legrand Rams did blood work a few months ago. Takes stool softeners as needed for constipation, which helps. Good appetite. No dysphagia. Blood work upcoming with Dr. Dorris Fetch in future.   Past Medical History:  Diagnosis Date  . Anxiety   . Constipation 04/30/2013  . Depression   . Diabetes mellitus without complication (HCC)    borderline  . Fibroids, intramural 01/21/2016  . GERD (gastroesophageal reflux disease)   . Heartburn   . Hot flashes 01/11/2016  . Mixed incontinence   . MS (multiple sclerosis) ([place])   . Pancreatitis   . PONV (postoperative nausea and vomiting)   . PTSD (post-traumatic stress disorder)   . Thyroid disease   . Urge urinary incontinence 05/10/2015  . Vaginal bleeding 01/11/2016    Past Surgical History:  Procedure Laterality Date  . BIOPSY  12/07/2015   Procedure: BIOPSY;  Surgeon: Danie Binder, MD;  [facility]: [facility];  Service: Endoscopy;;  right and left colon biopsies, gastric bx's   . COLONOSCOPY WITH PROPOFOL N/A 12/07/2015   Dr. Oneida Alar: mild proctitis, hyperplastic polyp   . ENDOMETRIAL ABLATION    . ESOPHAGOGASTRODUODENOSCOPY (EGD) WITH PROPOFOL N/A 12/07/2015   Dr. 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   . INCONTINENCE SURGERY    . SAVORY DILATION N/A 12/07/2015   Procedure: SAVORY DILATION;  Surgeon: Danie Binder, MD;  Location: [facility];  Service: Endoscopy;  Laterality: N/A;  . TUBAL LIGATION      Current Outpatient Medications  Medication Sig Dispense Refill  . acetaminophen (TYLENOL) 325 MG tablet Take 650 mg by mouth every 6 (six) hours as needed.    . cetirizine (ZYRTEC) 10 MG tablet Take 10 mg by mouth daily.    . Dimethyl Fumarate (TECFIDERA) 240 MG CPDR Take 240 mg by mouth 2 (two) times daily.    Mariane Baumgarten Sodium (COLACE PO) Take by mouth as needed.    Marland Kitchen escitalopram (LEXAPRO) 20 MG tablet Take 20 mg by mouth daily.    . fesoterodine (TOVIAZ) 4 MG TB24 tablet Take 8 mg by mouth daily.     Marland Kitchen levothyroxine (SYNTHROID, LEVOTHROID) 88 MCG tablet TAKE 1 TABLET BY MOUTH DAILY BEFORE BREAKFAST. 30 tablet 3  . omeprazole (PRILOSEC) 20 MG capsule Take 40 mg by mouth daily.     . traMADol (ULTRAM) 50 MG tablet Take 50 mg by mouth every 6 (six) hours as needed.    Marland Kitchen UNABLE TO FIND Vit B 12 injection-every  month    . Vitamin D, Ergocalciferol, (DRISDOL) 1.25 MG (50000 UT) CAPS capsule TAKE 1 CAPSULE BY MOUTH EVERY 7  DAYS. 4 capsule 2   No current facility-administered medications for this visit.     Allergies as of 10/11/2018 - Review Complete 10/11/2018  Allergen Reaction Noted  . Nsaids Other (See Comments) 01/17/2016  . Tuberculin tests Dermatitis 01/18/2015    Family History  Problem Relation Age of Onset  . Diabetes Father   . Heart disease Father   . Hypertension Paternal Aunt   . Heart disease Paternal Aunt   . Seizures Paternal Aunt   . Breast cancer Paternal Aunt   . Heart disease Paternal Grandmother   . Depression Daughter   . Hypertension Sister   . Heart disease Sister   . Depression Son   . ADD / ADHD Son   . Depression Daughter   . Colon cancer Neg Hx      Social History   Socioeconomic History  . Marital status: Legally Separated    Spouse name: Not on file  . Number of children: Not on file  . Years of education: Not on file  . Highest education level: Not on file  Occupational History  . Occupation: disability  Social Needs  . Financial resource strain: Not on file  . Food insecurity:    Worry: Not on file    Inability: Not on file  . Transportation needs:    Medical: Not on file    Non-medical: Not on file  Tobacco Use  . Smoking status: Never Smoker  . Smokeless tobacco: Never Used  Substance and Sexual Activity  . Alcohol use: No    Alcohol/week: 0.0 standard drinks  . Drug use: No  . Sexual activity: Never    Birth control/protection: Surgical    Comment: tubal and ablation  Lifestyle  . Physical activity:    Days per week: Not on file    Minutes per session: Not on file  . Stress: Not on file  Relationships  . Social connections:    Talks on phone: Not on file    Gets together: Not on file    Attends religious service: Not on file    Active member of club or organization: Not on file    Attends meetings of clubs or organizations: Not on file    Relationship status: Not on file  Other Topics Concern  . Not on file  Social History Narrative  . Not on file    Review of Systems: Gen: Denies fever, chills, anorexia. Denies fatigue, weakness, weight loss.  CV: Denies chest pain, palpitations, syncope, peripheral edema, and claudication. Resp: Denies dyspnea at rest, cough, wheezing, coughing up blood, and pleurisy. GI: see HPI Derm: Denies rash, itching, dry skin Psych: Denies depression, anxiety, memory loss, confusion. No homicidal or suicidal ideation.  Heme: Denies bruising, bleeding, and enlarged lymph nodes.  Physical Exam: BP 110/68   Pulse (!) 58   Temp 97.7 F (36.5 C) (Oral)   Ht 5\' 4"  (1.626 m)   Wt 141 lb 9.6 oz (64.2 kg)   BMI 24.31 kg/m  General:   Alert and oriented. No distress  noted. Pleasant and cooperative.  Head:  Normocephalic and atraumatic. Eyes:  Conjuctiva clear without scleral icterus. Mouth:  Oral mucosa pink and moist. Edentulous.  Abdomen:  +BS, soft, non-tender and non-distended. No rebound or guarding. No HSM or masses noted. Msk:  Symmetrical without gross deformities. Normal posture. Extremities:  Without edema. Neurologic:  Alert and  oriented x4 Psych:  Alert and cooperative. Normal mood and affect.

## 2018-10-10 ENCOUNTER — Telehealth: Payer: Self-pay | Admitting: Radiology

## 2018-10-10 ENCOUNTER — Other Ambulatory Visit (HOSPITAL_COMMUNITY): Payer: Self-pay | Admitting: Neurology

## 2018-10-10 ENCOUNTER — Other Ambulatory Visit: Payer: Self-pay | Admitting: Neurology

## 2018-10-10 DIAGNOSIS — G35 Multiple sclerosis: Secondary | ICD-10-CM

## 2018-10-10 DIAGNOSIS — R269 Unspecified abnormalities of gait and mobility: Secondary | ICD-10-CM

## 2018-10-11 ENCOUNTER — Ambulatory Visit: Payer: Medicaid Other | Admitting: Gastroenterology

## 2018-10-11 ENCOUNTER — Other Ambulatory Visit (HOSPITAL_COMMUNITY): Payer: Self-pay | Admitting: Neurology

## 2018-10-11 ENCOUNTER — Encounter: Payer: Self-pay | Admitting: Gastroenterology

## 2018-10-11 ENCOUNTER — Other Ambulatory Visit: Payer: Self-pay | Admitting: Neurology

## 2018-10-11 ENCOUNTER — Other Ambulatory Visit: Payer: Self-pay

## 2018-10-11 VITALS — BP 110/68 | HR 58 | Temp 97.7°F | Ht 64.0 in | Wt 141.6 lb

## 2018-10-11 DIAGNOSIS — R269 Unspecified abnormalities of gait and mobility: Secondary | ICD-10-CM

## 2018-10-11 DIAGNOSIS — R634 Abnormal weight loss: Secondary | ICD-10-CM

## 2018-10-11 DIAGNOSIS — G35 Multiple sclerosis: Secondary | ICD-10-CM

## 2018-10-11 NOTE — Patient Instructions (Signed)
Please return in about 6 weeks so we can weigh you on these scales. I will see you for an actual office visit in 3 months.  Continue to choose high protein foods and don't skip meals.  Call if any issues in the meantime!  I enjoyed seeing you again today! As you know, I value our relationship and want to provide genuine, compassionate, and quality care. I welcome your feedback. If you receive a survey regarding your visit,  I greatly appreciate you taking time to fill this out. See you next time!  Annitta Needs, PhD, ANP-BC [facility]

## 2018-10-14 NOTE — Assessment & Plan Note (Signed)
Significant weight loss noted since March 2019. CT on file without concerning findings. She does endorse changing her diet to avoid sweets, and she continues to follow closely with Dr. Dorris Fetch due to hypothyroidism and close dose adjustments of levothyroxine. Recent TSH 0.02.   Patient will return in 6 weeks for weight check only and face to face office visit in 3 months. No concerning lower or upper GI signs/symptoms but will monitor closely (last EGD and colonoscopy 2017).

## 2018-10-14 NOTE — Progress Notes (Signed)
cc'd to pcp 

## 2018-10-16 ENCOUNTER — Other Ambulatory Visit: Payer: Self-pay

## 2018-10-16 ENCOUNTER — Ambulatory Visit (HOSPITAL_COMMUNITY)
Admission: RE | Admit: 2018-10-16 | Discharge: 2018-10-16 | Disposition: A | Discharge status: Home / Self Care | Payer: Medicaid Other | Source: Ambulatory Visit | Attending: Neurology | Admitting: Neurology

## 2018-10-16 DIAGNOSIS — R269 Unspecified abnormalities of gait and mobility: Secondary | ICD-10-CM | POA: Diagnosis present

## 2018-10-16 DIAGNOSIS — G35 Multiple sclerosis: Secondary | ICD-10-CM | POA: Diagnosis present

## 2019-01-09 ENCOUNTER — Other Ambulatory Visit: Payer: Self-pay | Admitting: "Endocrinology

## 2019-01-10 LAB — TSH: TSH: 0.59 m[IU]/L

## 2019-01-10 LAB — T4, FREE: FREE T4: 1 ng/dL (ref 0.8–1.8)

## 2019-01-14 ENCOUNTER — Ambulatory Visit: Payer: Medicaid Other | Admitting: Gastroenterology

## 2019-01-20 ENCOUNTER — Ambulatory Visit (INDEPENDENT_AMBULATORY_CARE_PROVIDER_SITE_OTHER): Payer: Medicaid Other | Admitting: "Endocrinology

## 2019-01-20 ENCOUNTER — Other Ambulatory Visit: Payer: Self-pay

## 2019-01-20 ENCOUNTER — Encounter: Payer: Self-pay | Admitting: "Endocrinology

## 2019-01-20 VITALS — BP 116/77 | HR 74 | Ht 64.0 in | Wt 170.2 lb

## 2019-01-20 DIAGNOSIS — E038 Other specified hypothyroidism: Secondary | ICD-10-CM | POA: Diagnosis not present

## 2019-01-20 MED ORDER — LEVOTHYROXINE SODIUM 88 MCG PO TABS: ORAL_TABLET | ORAL | 6 refills | Status: DC

## 2019-01-20 NOTE — Progress Notes (Signed)
Endocrinology follow-up note  Subjective:    Patient ID: Dawn Barnes, female    DOB: 1966-02-10, PCP Rosita Fire, MD   Past Medical History:  Diagnosis Date  . Anxiety   . Constipation 04/30/2013  . Depression   . Diabetes mellitus without complication (HCC)    borderline  . Fibroids, intramural 01/21/2016  . GERD (gastroesophageal reflux disease)   . Heartburn   . Hot flashes 01/11/2016  . Mixed incontinence   . MS (multiple sclerosis) ([place])   . Pancreatitis   . PONV (postoperative nausea and vomiting)   . PTSD (post-traumatic stress disorder)   . Thyroid disease   . Urge urinary incontinence 05/10/2015  . Vaginal bleeding 01/11/2016   Past Surgical History:  Procedure Laterality Date  . BIOPSY  12/07/2015   Procedure: BIOPSY;  Surgeon: Danie Binder, MD;  Location: [facility];  Service: Endoscopy;;  right and left colon biopsies, gastric bx's   . COLONOSCOPY WITH PROPOFOL N/A 12/07/2015   Dr. Oneida Alar: mild proctitis, hyperplastic polyp   . ENDOMETRIAL ABLATION    . ESOPHAGOGASTRODUODENOSCOPY (EGD) WITH PROPOFOL N/A 12/07/2015   Dr. 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   . INCONTINENCE SURGERY    . SAVORY DILATION N/A 12/07/2015   Procedure: SAVORY DILATION;  Surgeon: Danie Binder, MD;  Location: [facility];  Service: Endoscopy;  Laterality: N/A;  . TUBAL LIGATION     Social History   Socioeconomic History  . Marital status: Legally Separated    Spouse name: Not on file  . Number of children: Not on file  . Years of education: Not on file  . Highest education level: Not on file  Occupational History  . Occupation: disability  Social Needs  . Financial resource strain: Not on file  . Food insecurity    Worry: Not on file    Inability: Not on file  . Transportation needs    Medical: Not on file    Non-medical: Not on file  Tobacco Use  . Smoking status: Never Smoker  . Smokeless tobacco: Never Used   Substance and Sexual Activity  . Alcohol use: No    Alcohol/week: 0.0 standard drinks  . Drug use: No  . Sexual activity: Never    Birth control/protection: Surgical    Comment: tubal and ablation  Lifestyle  . Physical activity    Days per week: Not on file    Minutes per session: Not on file  . Stress: Not on file  Relationships  . Social Herbalist on phone: Not on file    Gets together: Not on file    Attends religious service: Not on file    Active member of club or organization: Not on file    Attends meetings of clubs or organizations: Not on file    Relationship status: Not on file  Other Topics Concern  . Not on file  Social History Narrative  . Not on file   Outpatient Encounter Medications as of 01/20/2019  Medication Sig  . acetaminophen (TYLENOL) 325 MG tablet Take 650 mg by mouth every 6 (six) hours as needed.  . cetirizine (ZYRTEC) 10 MG tablet Take 10 mg by mouth daily.  . Dimethyl Fumarate (TECFIDERA) 240 MG CPDR Take 240 mg by mouth 2 (two) times daily.  Mariane Baumgarten Sodium (COLACE PO) Take by mouth as needed.  Marland Kitchen escitalopram (LEXAPRO) 20 MG tablet Take 20 mg by mouth daily.  Marland Kitchen  fesoterodine (TOVIAZ) 4 MG TB24 tablet Take 8 mg by mouth daily.   Marland Kitchen levothyroxine (SYNTHROID) 88 MCG tablet TAKE 1 TABLET BY MOUTH DAILY BEFORE BREAKFAST.  Marland Kitchen omeprazole (PRILOSEC) 20 MG capsule Take 40 mg by mouth daily.   . traMADol (ULTRAM) 50 MG tablet Take 50 mg by mouth every 6 (six) hours as needed.  Marland Kitchen UNABLE TO FIND Vit B 12 injection-every  month  . Vitamin D, Ergocalciferol, (DRISDOL) 1.25 MG (50000 UT) CAPS capsule TAKE 1 CAPSULE BY MOUTH EVERY 7 DAYS.  . [DISCONTINUED] levothyroxine (SYNTHROID, LEVOTHROID) 88 MCG tablet TAKE 1 TABLET BY MOUTH DAILY BEFORE BREAKFAST.   No facility-administered encounter medications on file as of 01/20/2019.    ALLERGIES: Allergies  Allergen Reactions  . Nsaids Other (See Comments)    Pt states MD told her not to take due to  stomach irritation  . Tuberculin Tests Dermatitis   VACCINATION STATUS: Immunization History  Administered Date(s) Administered  . Influenza-Unspecified 04/30/2014, 03/15/2015    HPI  53 year old female patient with medical history of hypothyroidism from Hashimoto's thyroiditis.  -She is currently on levothyroxine 88 mcg p.o. every morning.  She continues to tolerate this medication.  She has no new complaints today.  She is reversing some of the weight loss she had unintentionally before her last visit.    -She denies heat intolerance, palpitations, tremors, nor anxiety. -She has a long-standing problem with dry skin, still not using Vaseline or any moisturizer on a regular basis.  She also has history of prediabetes not on medications.     Review of Systems   Constitutional: + Weight gain + fatigue, no subjective hyperthermia, +hypothermia Eyes: no blurry vision, no xerophthalmia ENT: no sore throat, no nodules palpated in throat Cardiovascular: no CP/SOB/palpitations/leg swelling Musculoskeletal: no muscle/joint aches Skin: no rashes, + dry skin . Neurological: no tremors/numbness/tingling/dizziness Psychiatric: no depression/anxiety  Objective:    BP 116/77   Pulse 74   Ht 5\' 4"  (1.626 m)   Wt 170 lb 3.2 oz (77.2 kg)   SpO2 98%   BMI 29.21 kg/m   Wt Readings from Last 3 Encounters:  01/20/19 170 lb 3.2 oz (77.2 kg)  10/11/18 141 lb 9.6 oz (64.2 kg)  09/19/18 136 lb (61.7 kg)    Physical Exam Constitutional:   Alert and oriented x3, not in acute distress.  Eyes: PERRLA, EOMI, no exophthalmos ENT: Moist mucous membranes  , + palpable thyromegaly, no cervical lymphadenopathy.   Musculoskeletal: no deformities, strength intact in all 4 Skin: + diffusely dry skin on bilateral upper and lower extremities. Neurological: no tremor with outstretched hands   Recent Results (from the past 2160 hour(s))  T4, free     Status: None   Collection Time: 01/09/19  1:17 PM   Result Value Ref Range   Free T4 1.0 0.8 - 1.8 ng/dL  TSH     Status: None   Collection Time: 01/09/19  1:17 PM  Result Value Ref Range   TSH 0.59 mIU/L    Comment:           Reference Range .           > or = 20 Years  0.40-4.50 .                Pregnancy Ranges           First trimester    0.26-2.66           Second trimester   0.55-2.73  Third trimester    0.43-2.91       Assessment & Plan:   1. Hypothyroidism due to Hashimoto's thyroiditis -Her previsit labs are consistent with appropriate replacement.  She is advised to continue levothyroxine   88 mcg p.o. before breakfast.        - We discussed about the correct intake of her thyroid hormone, on empty stomach at fasting, with water, separated by at least 30 minutes from breakfast and other medications,  and separated by more than 4 hours from calcium, iron, multivitamins, acid reflux medications (PPIs). -Patient is made aware of the fact that thyroid hormone replacement is needed for life, dose to be adjusted by periodic monitoring of thyroid function tests.   - I advised her on moisturizing skin with Vaseline.  2. Pre-diabetes -Her last  A1c was 5.8%. She will not require medication intervention at this time.  Weight loss should be helping.  She is encouraged to stay active and avoid simple/processed carbohydrates.   - I advised patient to maintain close follow up with Rosita Fire, MD for primary care needs.   Time for this visit: 15 minutes. Audelia Acton  participated in the discussions, expressed understanding, and voiced agreement with the above plans.  All questions were answered to her satisfaction. she is encouraged to contact clinic should she have any questions or concerns prior to her return visit.  Follow up plan: Return in about 6 months (around 07/22/2019) for Follow up with Pre-visit Labs.  Glade Lloyd, MD Phone: [phone]  Fax: [phone]  -  This note was partially dictated with  voice recognition software. Similar sounding words can be transcribed inadequately or may not  be corrected upon review.  01/20/2019, 5:00 PM

## 2019-03-12 ENCOUNTER — Encounter: Payer: Self-pay | Admitting: Gastroenterology

## 2019-03-12 ENCOUNTER — Ambulatory Visit (INDEPENDENT_AMBULATORY_CARE_PROVIDER_SITE_OTHER): Payer: Medicaid Other | Admitting: Gastroenterology

## 2019-03-12 ENCOUNTER — Other Ambulatory Visit: Payer: Self-pay

## 2019-03-12 VITALS — BP 126/82 | HR 67 | Temp 98.5°F | Ht 64.0 in | Wt 175.2 lb

## 2019-03-12 DIAGNOSIS — R634 Abnormal weight loss: Secondary | ICD-10-CM | POA: Diagnosis not present

## 2019-03-12 DIAGNOSIS — K219 Gastro-esophageal reflux disease without esophagitis: Secondary | ICD-10-CM | POA: Diagnosis not present

## 2019-03-12 DIAGNOSIS — K5900 Constipation, unspecified: Secondary | ICD-10-CM | POA: Diagnosis not present

## 2019-03-12 MED ORDER — LINACLOTIDE 290 MCG PO CAPS: 290.0000 ug | ORAL_CAPSULE | Freq: Every day | ORAL | 3 refills | Status: DC

## 2019-03-12 NOTE — Patient Instructions (Addendum)
Take Linzess each morning on an empty stomach 30 minutes before breakfast. I have sent this to your pharmacy to pick up if it works well for you. If not, call us. It can cause loose stool at first but should get better. If not, let us know.   It is important that you not take other medications with your thyroid medication. Wait at least 30 minutes after taking Synthroid before you take Linzess. Take Linzess on an empty stomach before breakfast to avoid diarrhea. It is best to take the reflux medication (omeprazole) 4 hours after the thyroid medication. You can take omeprazole (Prilosec) 30 minutes before lunch or 30 minutes before dinner.   We will see you back in 3 months!  I enjoyed seeing you again today! As you know, I value our relationship and want to provide genuine, compassionate, and quality care. I welcome your feedback. If you receive a survey regarding your visit,  I greatly appreciate you taking time to fill this out. See you next time!  Annitta Needs, PhD, ANP-BC [facility]

## 2019-03-12 NOTE — Assessment & Plan Note (Signed)
Resolved. Likely multifactorial in setting of thyroid disease, dietary changes. Discussed healthy eating choices. Return in 3 months.

## 2019-03-12 NOTE — Progress Notes (Addendum)
Primary Care Physician:  Rosita Fire, MD  Primary GI: Dr. Oneida Alar   Chief Complaint  Patient presents with  . Follow-up    Wt loss    HPI:   Dawn Barnes is a 53 y.o. female presenting today with a history of  dysphagia likely secondary to web and/or poor dentition. Colonoscopy with mild proctitis in setting of NSAIDs. 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 Had lost from 193 to 144 from March 2019 to Dec 2019. Due to LLQ "mass" felt with palpation, CT was completed Dec 2019. This showed large stool burden, gallstones, uterine fibroids. March 2020 weight was 141. Today, Aug 2020, weight is now 175.  TSH low in the past and followed by Dr. Dorris Fetch.   Intermittent constipation. BM once or twice a week. Sometimes straining. Omeprazole working well. No dysphagia. Started back to eating sweets, eating more. Taking medications with synthroid. Confused about dosing for medications.    Past Medical History:  Diagnosis Date  . Anxiety   . Constipation 04/30/2013  . Depression   . Diabetes mellitus without complication (HCC)    borderline  . Fibroids, intramural 01/21/2016  . GERD (gastroesophageal reflux disease)   . Heartburn   . Hot flashes 01/11/2016  . Mixed incontinence   . MS (multiple sclerosis) ([place])   . Pancreatitis   . PONV (postoperative nausea and vomiting)   . PTSD (post-traumatic stress disorder)   . Thyroid disease   . Urge urinary incontinence 05/10/2015  . Vaginal bleeding 01/11/2016    Past Surgical History:  Procedure Laterality Date  . BIOPSY  12/07/2015   Procedure: BIOPSY;  Surgeon: Danie Binder, MD;  [facility]: [facility];  Service: Endoscopy;;  right and left colon biopsies, gastric bx's   . COLONOSCOPY WITH PROPOFOL N/A 12/07/2015   Dr. Oneida Alar: mild proctitis, hyperplastic polyp   . ENDOMETRIAL ABLATION    . ESOPHAGOGASTRODUODENOSCOPY (EGD)  WITH PROPOFOL N/A 12/07/2015   Dr. 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   . INCONTINENCE SURGERY    . SAVORY DILATION N/A 12/07/2015   Procedure: SAVORY DILATION;  Surgeon: Danie Binder, MD;  [facility];  Service: Endoscopy;  Laterality: N/A;  . TUBAL LIGATION      Current Outpatient Medications  Medication Sig Dispense Refill  . acetaminophen (TYLENOL) 325 MG tablet Take 650 mg by mouth every 6 (six) hours as needed.    . cetirizine (ZYRTEC) 10 MG tablet Take 10 mg by mouth daily.    . Dimethyl Fumarate (TECFIDERA) 240 MG CPDR Take 240 mg by mouth 2 (two) times daily.    Mariane Baumgarten Sodium (COLACE PO) Take by mouth as needed.    Marland Kitchen escitalopram (LEXAPRO) 20 MG tablet Take 20 mg by mouth daily.    . fesoterodine (TOVIAZ) 4 MG TB24 tablet Take 8 mg by mouth daily.     Marland Kitchen levothyroxine (SYNTHROID) 88 MCG tablet TAKE 1 TABLET BY MOUTH DAILY BEFORE BREAKFAST. 30 tablet 6  . omeprazole (PRILOSEC) 20 MG capsule Take 40 mg by mouth daily.     Marland Kitchen UNABLE TO FIND Vit B 12 injection-every  month    . Vitamin D, Ergocalciferol, (DRISDOL) 1.25 MG (50000 UT) CAPS capsule TAKE 1 CAPSULE BY MOUTH EVERY 7 DAYS. 4 capsule 2  . gabapentin (NEURONTIN) 300 MG capsule Take 300 mg by mouth 2 (two) times daily.    Marland Kitchen  linaclotide (LINZESS) 290 MCG CAPS capsule Take 1 capsule (290 mcg total) by mouth daily before breakfast. 90 capsule 3  . methocarbamol (ROBAXIN) 500 MG tablet Take 500 mg by mouth 2 (two) times daily.     No current facility-administered medications for this visit.     Allergies as of 03/12/2019 - Review Complete 03/12/2019  Allergen Reaction Noted  . Nsaids Other (See Comments) 01/17/2016  . Tuberculin tests Dermatitis 01/18/2015    Family History  Problem Relation Age of Onset  . Diabetes Father   . Heart disease Father   . Hypertension Paternal Aunt   . Heart disease Paternal Aunt   . Seizures Paternal Aunt   . Breast  cancer Paternal Aunt   . Heart disease Paternal Grandmother   . Depression Daughter   . Hypertension Sister   . Heart disease Sister   . Depression Son   . ADD / ADHD Son   . Depression Daughter   . Colon cancer Neg Hx     Social History   Socioeconomic History  . Marital status: Legally Separated    Spouse name: Not on file  . Number of children: Not on file  . Years of education: Not on file  . Highest education level: Not on file  Occupational History  . Occupation: disability  Social Needs  . Financial resource strain: Not on file  . Food insecurity    Worry: Not on file    Inability: Not on file  . Transportation needs    Medical: Not on file    Non-medical: Not on file  Tobacco Use  . Smoking status: Never Smoker  . Smokeless tobacco: Never Used  Substance and Sexual Activity  . Alcohol use: No    Alcohol/week: 0.0 standard drinks  . Drug use: No  . Sexual activity: Never    Birth control/protection: Surgical    Comment: tubal and ablation  Lifestyle  . Physical activity    Days per week: Not on file    Minutes per session: Not on file  . Stress: Not on file  Relationships  . Social Herbalist on phone: Not on file    Gets together: Not on file    Attends religious service: Not on file    Active member of club or organization: Not on file    Attends meetings of clubs or organizations: Not on file    Relationship status: Not on file  Other Topics Concern  . Not on file  Social History Narrative  . Not on file    Review of Systems: Gen: Denies fever, chills, anorexia. Denies fatigue, weakness, weight loss.  CV: Denies chest pain, palpitations, syncope, peripheral edema, and claudication. Resp: Denies dyspnea at rest, cough, wheezing, coughing up blood, and pleurisy. GI: Denies vomiting blood, jaundice, and fecal incontinence.   Denies dysphagia or odynophagia. Derm: Denies rash, itching, dry skin Psych: Denies depression, anxiety, memory  loss, confusion. No homicidal or suicidal ideation.  Heme: Denies bruising, bleeding, and enlarged lymph nodes.  Physical Exam: BP 126/82   Pulse 67   Temp 98.5 F (36.9 C)   Ht 5\' 4"  (1.626 m)   Wt 175 lb 3.2 oz (79.5 kg)   BMI 30.07 kg/m  General:   Alert and oriented. No distress noted. Pleasant and cooperative.  Head:  Normocephalic and atraumatic. Eyes:  Conjuctiva clear without scleral icterus. Mouth:  Oral mucosa pink and moist. Good dentition. No lesions. Abdomen:  +BS, soft, non-tender  and non-distended. No rebound or guarding. No HSM or masses noted. Msk:  Symmetrical without gross deformities. Normal posture. Extremities:  Without edema. Neurologic:  Alert and  oriented x4 Psych:  Alert and cooperative. Normal mood and affect.

## 2019-03-12 NOTE — Assessment & Plan Note (Addendum)
Start Linzess 290 mcg once each morning on an empty stomach. Discussed NOT taking with Synthroid, as this needs to be taken alone. Linzess may be taken at least 30 minutes after taking synthroid. Call with progress report. I have sent prescription to pharmacy in case this works well for her.

## 2019-03-12 NOTE — Assessment & Plan Note (Addendum)
Controlled with omeprazole daily. Currently not taking appropriately as she has been taking with synthroid. Discussed again that synthroid needed to be taken on a empty stomach with water, at least 30 minutes prior to breakfast or other medications. We discussed that omeprazole would need to be changed to either 30 minutes prior to lunch or dinner so as not to interfere with Synthroid. I have included this on her handout. Return in 3 months.

## 2019-03-13 ENCOUNTER — Telehealth: Payer: Self-pay | Admitting: Gastroenterology

## 2019-03-13 NOTE — Telephone Encounter (Signed)
Vicente Males, I have added these meds to medication list.

## 2019-03-13 NOTE — Telephone Encounter (Signed)
Pt saw AB yesterday and was calling today to let us know the name of medicines she is taking. Robaxin 500mg  twice a day and Gabapentin 300 mg twice a day and she said that she gets those from Dr Merlene Laughter.

## 2019-05-02 ENCOUNTER — Other Ambulatory Visit: Payer: Self-pay | Admitting: "Endocrinology

## 2019-06-10 ENCOUNTER — Other Ambulatory Visit (HOSPITAL_COMMUNITY): Payer: Self-pay | Admitting: Internal Medicine

## 2019-06-10 DIAGNOSIS — Z1231 Encounter for screening mammogram for malignant neoplasm of breast: Secondary | ICD-10-CM

## 2019-06-11 ENCOUNTER — Other Ambulatory Visit: Payer: Self-pay

## 2019-06-11 ENCOUNTER — Encounter: Payer: Self-pay | Admitting: Gastroenterology

## 2019-06-11 ENCOUNTER — Ambulatory Visit (INDEPENDENT_AMBULATORY_CARE_PROVIDER_SITE_OTHER): Payer: Medicaid Other | Admitting: Gastroenterology

## 2019-06-11 VITALS — BP 132/88 | HR 76 | Temp 97.2°F | Ht 64.0 in | Wt 193.4 lb

## 2019-06-11 DIAGNOSIS — K2100 Gastro-esophageal reflux disease with esophagitis, without bleeding: Secondary | ICD-10-CM | POA: Diagnosis not present

## 2019-06-11 DIAGNOSIS — K5900 Constipation, unspecified: Secondary | ICD-10-CM

## 2019-06-11 NOTE — Patient Instructions (Signed)
I am glad you are doing well!  For constipation: continue Linzess once each morning. You can take Miralax daily as needed to help. I recommend taking supplemental fiber such as Benefiber each day. Drink at least 8 glasses of water a day.  We will see you in 6 months!  I enjoyed seeing you again today! As you know, I value our relationship and want to provide genuine, compassionate, and quality care. I welcome your feedback. If you receive a survey regarding your visit,  I greatly appreciate you taking time to fill this out. See you next time!  Annitta Needs, PhD, ANP-BC [facility]

## 2019-06-11 NOTE — Progress Notes (Signed)
Referring Provider: Rosita Fire, MD Primary Care Physician:  Rosita Fire, MD Primary GI: Dr. Oneida Alar   Chief Complaint  Patient presents with  . Dysphagia    F/U. NOT CURRENTLY    HPI:   Dawn Barnes is a 53 y.o. female presenting today with a history of history of dysphagia likely secondary to web and/or poor dentition, chronic constipation and GERD. Colonoscopy with mild proctitis in setting of NSAIDs. Edentulous.Last EGD in May 2017. Concern for weight loss in the past but now resolved with adjustment in thyroid medications.   Linzess 290 mcg for constipation. Has BM 2-3 times per week. Drinks 1/2 bottle of water a day then sodas rest of day. No abdominal pain. GERD controlled. No dysphagia. No GI bleeding.   Past Medical History:  Diagnosis Date  . Anxiety   . Constipation 04/30/2013  . Depression   . Diabetes mellitus without complication (HCC)    borderline  . Fibroids, intramural 01/21/2016  . GERD (gastroesophageal reflux disease)   . Heartburn   . Hot flashes 01/11/2016  . Mixed incontinence   . MS (multiple sclerosis) ([place])   . Pancreatitis   . PONV (postoperative nausea and vomiting)   . PTSD (post-traumatic stress disorder)   . Thyroid disease   . Urge urinary incontinence 05/10/2015  . Vaginal bleeding 01/11/2016    Past Surgical History:  Procedure Laterality Date  . BIOPSY  12/07/2015   Procedure: BIOPSY;  Surgeon: Danie Binder, MD;  [facility];  Service: Endoscopy;;  right and left colon biopsies, gastric bx's   . COLONOSCOPY WITH PROPOFOL N/A 12/07/2015   Dr. Oneida Alar: mild proctitis, hyperplastic polyp   . ENDOMETRIAL ABLATION    . ESOPHAGOGASTRODUODENOSCOPY (EGD) WITH PROPOFOL N/A 12/07/2015   Dr. 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   . INCONTINENCE SURGERY    . SAVORY DILATION N/A 12/07/2015   Procedure: SAVORY DILATION;  Surgeon: Danie Binder, MD;  [facility];  Service: Endoscopy;  Laterality: N/A;  . TUBAL LIGATION      Current Outpatient Medications  Medication Sig Dispense Refill  . acetaminophen (TYLENOL) 325 MG tablet Take 650 mg by mouth every 6 (six) hours as needed.    . cetirizine (ZYRTEC) 10 MG tablet Take 10 mg by mouth daily.    . Dimethyl Fumarate (TECFIDERA) 240 MG CPDR Take 240 mg by mouth 2 (two) times daily.    Marland Kitchen escitalopram (LEXAPRO) 20 MG tablet Take 20 mg by mouth daily.    . fesoterodine (TOVIAZ) 4 MG TB24 tablet Take 8 mg by mouth daily.     Marland Kitchen gabapentin (NEURONTIN) 300 MG capsule Take 300 mg by mouth 2 (two) times daily.    Marland Kitchen HYDROcodone-acetaminophen (NORCO/VICODIN) 5-325 MG tablet Take 1 tablet by mouth 2 (two) times daily as needed for moderate pain.    Marland Kitchen levothyroxine (SYNTHROID) 88 MCG tablet TAKE 1 TABLET BY MOUTH DAILY BEFORE BREAKFAST. 30 tablet 6  . linaclotide (LINZESS) 290 MCG CAPS capsule Take 1 capsule (290 mcg total) by mouth daily before breakfast. 90 capsule 3  . methocarbamol (ROBAXIN) 500 MG tablet Take 500 mg by mouth 2 (two) times daily.    Marland Kitchen omeprazole (PRILOSEC) 20 MG capsule Take 20 mg by mouth daily.     Marland Kitchen UNABLE TO FIND Vit B 12 injection-every  month    . Vitamin D, Ergocalciferol, (DRISDOL) 1.25 MG (50000 UT) CAPS capsule TAKE  1 CAPSULE BY MOUTH EVERY 7 DAYS. 4 capsule 2  . Docusate Sodium (COLACE PO) Take by mouth as needed.     No current facility-administered medications for this visit.     Allergies as of 06/11/2019 - Review Complete 06/11/2019  Allergen Reaction Noted  . Nsaids Other (See Comments) 01/17/2016  . Tuberculin tests Dermatitis 01/18/2015    Family History  Problem Relation Age of Onset  . Diabetes Father   . Heart disease Father   . Hypertension Paternal Aunt   . Heart disease Paternal Aunt   . Seizures Paternal Aunt   . Breast cancer Paternal Aunt   . Heart disease Paternal Grandmother   . Depression Daughter   . Hypertension Sister   . Heart disease  Sister   . Depression Son   . ADD / ADHD Son   . Depression Daughter   . Colon cancer Neg Hx     Social History   Socioeconomic History  . Marital status: Legally Separated    Spouse name: Not on file  . Number of children: Not on file  . Years of education: Not on file  . Highest education level: Not on file  Occupational History  . Occupation: disability  Social Needs  . Financial resource strain: Not on file  . Food insecurity    Worry: Not on file    Inability: Not on file  . Transportation needs    Medical: Not on file    Non-medical: Not on file  Tobacco Use  . Smoking status: Never Smoker  . Smokeless tobacco: Never Used  Substance and Sexual Activity  . Alcohol use: No    Alcohol/week: 0.0 standard drinks  . Drug use: No  . Sexual activity: Never    Birth control/protection: Surgical    Comment: tubal and ablation  Lifestyle  . Physical activity    Days per week: Not on file    Minutes per session: Not on file  . Stress: Not on file  Relationships  . Social Herbalist on phone: Not on file    Gets together: Not on file    Attends religious service: Not on file    Active member of club or organization: Not on file    Attends meetings of clubs or organizations: Not on file    Relationship status: Not on file  Other Topics Concern  . Not on file  Social History Narrative  . Not on file    Review of Systems: Gen: Denies fever, chills, anorexia. Denies fatigue, weakness, weight loss.  CV: Denies chest pain, palpitations, syncope, peripheral edema, and claudication. Resp: Denies dyspnea at rest, cough, wheezing, coughing up blood, and pleurisy. GI: see HPI Derm: Denies rash, itching, dry skin Psych: Denies depression, anxiety, memory loss, confusion. No homicidal or suicidal ideation.  Heme: Denies bruising, bleeding, and enlarged lymph nodes.  Physical Exam: BP 132/88   Pulse 76   Temp (!) 97.2 F (36.2 C) (Oral)   Ht 5\' 4"  (1.626 m)    Wt 193 lb 6.4 oz (87.7 kg)   BMI 33.20 kg/m  General:   Alert and oriented. No distress noted. Pleasant and cooperative.  Head:  Normocephalic and atraumatic. Abdomen:  +BS, soft, non-tender and non-distended. No rebound or guarding. No HSM or masses noted. Msk:  Symmetrical without gross deformities. Normal posture. Extremities:  Without edema. Neurologic:  Alert and  oriented x4 Psych:  Alert and cooperative. Normal mood and affect.  ASSESSMENT: [place]  SHENY CALENDINE is a 53 y.o. female presenting today with history of dysphagia, chronic constipation, GERD, and weight loss, doing well overall. Weight has increased after adjustment with thyroid medication. Constipation fairly well managed with Linzess daily. No dysphagia currently. As she is doing well, we will have her return in 6 months.    PLAN:  Continue Linzess daily Miralax prn as needed Add supplemental fiber Return in 6 months  Annitta Needs, PhD, [facility]

## 2019-07-12 LAB — COMPLETE METABOLIC PANEL WITH GFR
AG Ratio: 1.4 (calc) (ref 1.0–2.5)
ALT: 9 U/L (ref 6–29)
AST: 15 U/L (ref 10–35)
Albumin: 4.2 g/dL (ref 3.6–5.1)
Alkaline phosphatase (APISO): 59 U/L (ref 37–153)
BUN: 13 mg/dL (ref 7–25)
CO2: 29 mmol/L (ref 20–32)
Calcium: 9.6 mg/dL (ref 8.6–10.4)
Chloride: 102 mmol/L (ref 98–110)
Creat: 0.72 mg/dL (ref 0.50–1.05)
GFR, Est African American: 112 mL/min/{1.73_m2} (ref >=60)
GFR, Est Non African American: 96 mL/min/{1.73_m2} (ref >=60)
Globulin: 3 g/dL (calc) (ref 1.9–3.7)
Glucose, Bld: 96 mg/dL (ref 65–99)
Potassium: 4.3 mmol/L (ref 3.5–5.3)
Sodium: 141 mmol/L (ref 135–146)
Total Bilirubin: 0.3 mg/dL (ref 0.2–1.2)
Total Protein: 7.2 g/dL (ref 6.1–8.1)

## 2019-07-12 LAB — HEMOGLOBIN A1C
Hgb A1c MFr Bld: 5.4 %{Hb}
Mean Plasma Glucose: 108 (calc)
eAG (mmol/L): 6 (calc)

## 2019-07-12 LAB — VITAMIN D 25 HYDROXY (VIT D DEFICIENCY, FRACTURES): Vit D, 25-Hydroxy: 50 ng/mL (ref 30–100)

## 2019-07-12 LAB — T4, FREE: Free T4: 1 ng/dL (ref 0.8–1.8)

## 2019-07-12 LAB — TSH: TSH: 2.27 mIU/L

## 2019-07-21 ENCOUNTER — Other Ambulatory Visit: Payer: Self-pay

## 2019-07-21 ENCOUNTER — Ambulatory Visit (HOSPITAL_COMMUNITY)
Admission: RE | Admit: 2019-07-21 | Discharge: 2019-07-21 | Disposition: A | Discharge status: Home / Self Care | Payer: Medicaid Other | Source: Ambulatory Visit | Attending: Internal Medicine | Admitting: Internal Medicine

## 2019-07-21 DIAGNOSIS — Z1231 Encounter for screening mammogram for malignant neoplasm of breast: Secondary | ICD-10-CM | POA: Insufficient documentation

## 2019-07-23 ENCOUNTER — Ambulatory Visit (INDEPENDENT_AMBULATORY_CARE_PROVIDER_SITE_OTHER): Payer: Medicaid Other | Admitting: "Endocrinology

## 2019-07-23 ENCOUNTER — Encounter: Payer: Self-pay | Admitting: "Endocrinology

## 2019-07-23 DIAGNOSIS — R7303 Prediabetes: Secondary | ICD-10-CM | POA: Diagnosis not present

## 2019-07-23 DIAGNOSIS — E038 Other specified hypothyroidism: Secondary | ICD-10-CM

## 2019-07-23 MED ORDER — LEVOTHYROXINE SODIUM 88 MCG PO TABS: ORAL_TABLET | ORAL | 6 refills | Status: DC

## 2019-07-23 NOTE — Progress Notes (Signed)
07/23/2019                                Endocrinology Telehealth Visit Follow up Note -During COVID -19 Pandemic  I connected with Dawn Barnes on 07/23/2019   by telephone and verified that I am speaking with the correct person using two identifiers. ATTISON GIRON, March 29, 1968. she has verbally consented to this visit. All issues noted in this document were discussed and addressed. The format was not optimal for physical exam.   Subjective:    Patient ID: Dawn Barnes, female    DOB: Aug 18, 1965, PCP Rosita Fire, MD   Past Medical History:  Diagnosis Date  . Anxiety   . Constipation 04/30/2013  . Depression   . Diabetes mellitus without complication (HCC)    borderline  . Fibroids, intramural 01/21/2016  . GERD (gastroesophageal reflux disease)   . Heartburn   . Hot flashes 01/11/2016  . Mixed incontinence   . MS (multiple sclerosis) ([place])   . Pancreatitis   . PONV (postoperative nausea and vomiting)   . PTSD (post-traumatic stress disorder)   . Thyroid disease   . Urge urinary incontinence 05/10/2015  . Vaginal bleeding 01/11/2016   Past Surgical History:  Procedure Laterality Date  . BIOPSY  12/07/2015   Procedure: BIOPSY;  Surgeon: Danie Binder, MD;  [facility];  Service: Endoscopy;;  right and left colon biopsies, gastric bx's   . COLONOSCOPY WITH PROPOFOL N/A 12/07/2015   Dr. Oneida Alar: mild proctitis, hyperplastic polyp   . ENDOMETRIAL ABLATION    . ESOPHAGOGASTRODUODENOSCOPY (EGD) WITH PROPOFOL N/A 12/07/2015   Dr. 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   . INCONTINENCE SURGERY    . SAVORY DILATION N/A 12/07/2015   Procedure: SAVORY DILATION;  Surgeon: Danie Binder, MD;  [facility];  Service: Endoscopy;  Laterality: N/A;  . TUBAL LIGATION     Social History   Socioeconomic History  . Marital status: Legally Separated    Spouse name: Not on file  . Number of children: Not on file  .  Years of education: Not on file  . Highest education level: Not on file  Occupational History  . Occupation: disability  Tobacco Use  . Smoking status: Never Smoker  . Smokeless tobacco: Never Used  Substance and Sexual Activity  . Alcohol use: No    Alcohol/week: 0.0 standard drinks  . Drug use: No  . Sexual activity: Never    Birth control/protection: Surgical    Comment: tubal and ablation  Other Topics Concern  . Not on file  Social History Narrative  . Not on file   Social Determinants of Health   Financial Resource Strain:   . Difficulty of Paying Living Expenses: Not on file  Food Insecurity:   . Worried About Charity fundraiser in the Last Year: Not on file  . Ran Out of Food in the Last Year: Not on file  Transportation Needs:   . Lack of Transportation (Medical): Not on file  . Lack of Transportation (Non-Medical): Not on file  Physical Activity:   . Days of Exercise per Week: Not on file  . Minutes of Exercise per Session: Not on file  Stress:   . Feeling of Stress : Not on file  Social Connections:   . Frequency of Communication with Friends and Family: Not on file  .  Frequency of Social Gatherings with Friends and Family: Not on file  . Attends Religious Services: Not on file  . Active Member of Clubs or Organizations: Not on file  . Attends Archivist Meetings: Not on file  . Marital Status: Not on file   Outpatient Encounter Medications as of 07/23/2019  Medication Sig  . acetaminophen (TYLENOL) 325 MG tablet Take 650 mg by mouth every 6 (six) hours as needed.  . cetirizine (ZYRTEC) 10 MG tablet Take 10 mg by mouth daily.  . Dimethyl Fumarate (TECFIDERA) 240 MG CPDR Take 240 mg by mouth 2 (two) times daily.  Marland Kitchen escitalopram (LEXAPRO) 20 MG tablet Take 20 mg by mouth daily.  . fesoterodine (TOVIAZ) 4 MG TB24 tablet Take 8 mg by mouth daily.   Marland Kitchen gabapentin (NEURONTIN) 300 MG capsule Take 300 mg by mouth 2 (two) times daily.  Marland Kitchen  HYDROcodone-acetaminophen (NORCO/VICODIN) 5-325 MG tablet Take 1 tablet by mouth 2 (two) times daily as needed for moderate pain.  Marland Kitchen levothyroxine (SYNTHROID) 88 MCG tablet TAKE 1 TABLET BY MOUTH DAILY BEFORE BREAKFAST.  Marland Kitchen linaclotide (LINZESS) 290 MCG CAPS capsule Take 1 capsule (290 mcg total) by mouth daily before breakfast.  . methocarbamol (ROBAXIN) 500 MG tablet Take 500 mg by mouth 2 (two) times daily.  Marland Kitchen omeprazole (PRILOSEC) 20 MG capsule Take 20 mg by mouth daily.   Marland Kitchen UNABLE TO FIND Vit B 12 injection-every  month  . Vitamin D, Ergocalciferol, (DRISDOL) 1.25 MG (50000 UT) CAPS capsule TAKE 1 CAPSULE BY MOUTH EVERY 7 DAYS.  . [DISCONTINUED] levothyroxine (SYNTHROID) 88 MCG tablet TAKE 1 TABLET BY MOUTH DAILY BEFORE BREAKFAST.   No facility-administered encounter medications on file as of 07/23/2019.   ALLERGIES: Allergies  Allergen Reactions  . Nsaids Other (See Comments)    Pt states MD told her not to take due to stomach irritation  . Tuberculin Tests Dermatitis   VACCINATION STATUS: Immunization History  Administered Date(s) Administered  . Influenza-Unspecified 04/30/2014, 03/15/2015    HPI  53 year old female patient with medical history of hypothyroidism from Hashimoto's thyroiditis.  -She is currently on levothyroxine 88 mcg p.o. daily before breakfast she continues to tolerate this medication.  She has reasonable compliance to her medication.  She has no new complaints today.  She is reversing some of the weight loss she had unintentionally before her last visit.    -She denies heat intolerance, palpitations, tremors, nor anxiety. -She has a long-standing problem with dry skin, still not using Vaseline or any moisturizer on a regular basis.  She also has history of prediabetes not on medications.     Review of Systems   Limited as above.  Objective:    There were no vitals taken for this visit.  Wt Readings from Last 3 Encounters:  06/11/19 193 lb 6.4 oz  (87.7 kg)  03/12/19 175 lb 3.2 oz (79.5 kg)  01/20/19 170 lb 3.2 oz (77.2 kg)    Physical Exam    Recent Results (from the past 2160 hour(s))  Hemoglobin A1c     Status: None   Collection Time: 07/11/19  2:04 PM  Result Value Ref Range   Hgb A1c MFr Bld 5.4 <5.7 % of total Hgb    Comment: For the purpose of screening for the presence of diabetes: . <5.7%       Consistent with the absence of diabetes 5.7-6.4%    Consistent with increased risk for diabetes             (  prediabetes) > or =6.5%  Consistent with diabetes . This assay result is consistent with a decreased risk of diabetes. . Currently, no consensus exists regarding use of hemoglobin A1c for diagnosis of diabetes in children. . According to American Diabetes Association (ADA) guidelines, hemoglobin A1c <7.0% represents optimal control in non-pregnant diabetic patients. Different metrics may apply to specific patient populations.  Standards of Medical Care in Diabetes(ADA). .    Mean Plasma Glucose 108 (calc)   eAG (mmol/L) 6.0 (calc)  COMPLETE METABOLIC PANEL WITH GFR     Status: None   Collection Time: 07/11/19  2:04 PM  Result Value Ref Range   Glucose, Bld 96 65 - 99 mg/dL    Comment: .            Fasting reference interval .    BUN 13 7 - 25 mg/dL   Creat 0.72 0.50 - 1.05 mg/dL    Comment: For patients >36 years of age, the reference limit for Creatinine is approximately 13% higher for people identified as African-American. .    GFR, Est Non African American 96 > OR = 60 mL/min/1.47m2   GFR, Est African American 112 > OR = 60 mL/min/1.57m2   BUN/Creatinine Ratio NOT APPLICABLE 6 - 22 (calc)   Sodium 141 135 - 146 mmol/L   Potassium 4.3 3.5 - 5.3 mmol/L   Chloride 102 98 - 110 mmol/L   CO2 29 20 - 32 mmol/L   Calcium 9.6 8.6 - 10.4 mg/dL   Total Protein 7.2 6.1 - 8.1 g/dL   Albumin 4.2 3.6 - 5.1 g/dL   Globulin 3.0 1.9 - 3.7 g/dL (calc)   AG Ratio 1.4 1.0 - 2.5 (calc)   Total Bilirubin 0.3  0.2 - 1.2 mg/dL   Alkaline phosphatase (APISO) 59 37 - 153 U/L   AST 15 10 - 35 U/L   ALT 9 6 - 29 U/L  VITAMIN D 25 Hydroxy (Vit-D Deficiency, Fractures)     Status: None   Collection Time: 07/11/19  2:04 PM  Result Value Ref Range   Vit D, 25-Hydroxy 50 30 - 100 ng/mL    Comment: Vitamin D Status         25-OH Vitamin D: . Deficiency:                    <20 ng/mL Insufficiency:             20 - 29 ng/mL Optimal:                 > or = 30 ng/mL . For 25-OH Vitamin D testing on patients on  D2-supplementation and patients for whom quantitation  of D2 and D3 fractions is required, the [facility](TM) 25-OH VIT D, (D2,D3), LC/MS/MS is recommended: order  code [id number] (patients >16yrs). See Note 1 . Note 1 . For additional information, please refer to  http://education.[facility].com/faq/FAQ199  (This link is being provided for informational/ educational purposes only.)   TSH     Status: None   Collection Time: 07/11/19  2:04 PM  Result Value Ref Range   TSH 2.27 mIU/L    Comment:           Reference Range .           > or = 20 Years  0.40-4.50 .                Pregnancy Ranges           First trimester  0.26-2.66           Second trimester   0.55-2.73           Third trimester    0.43-2.91   T4, free     Status: None   Collection Time: 07/11/19  2:04 PM  Result Value Ref Range   Free T4 1.0 0.8 - 1.8 ng/dL      Assessment & Plan:   1. Hypothyroidism due to Hashimoto's thyroiditis -Her previsit labs are consistent with appropriate replacement.  She is advised to continue levothyroxine 88 mcg p.o. daily before breakfast.     - We discussed about the correct intake of her thyroid hormone, on empty stomach at fasting, with water, separated by at least 30 minutes from breakfast and other medications,  and separated by more than 4 hours from calcium, iron, multivitamins, acid reflux medications (PPIs). -Patient is made aware of the fact that thyroid hormone  replacement is needed for life, dose to be adjusted by periodic monitoring of thyroid function tests.    2. Pre-diabetes -Her last  A1c was 5.4%. She will not require medication intervention at this time.  Weight loss should be helping.  She is encouraged to stay active and avoid simple/processed carbohydrates.   - I advised patient to maintain close follow up with Rosita Fire, MD for primary care needs.   Time for this visit: 15 minutes. Dawn Barnes  participated in the discussions, expressed understanding, and voiced agreement with the above plans.  All questions were answered to her satisfaction. she is encouraged to contact clinic should she have any questions or concerns prior to her return visit.   Follow up plan: Return in about 4 months (around 11/21/2019) for Follow up with Pre-visit Labs.  Glade Lloyd, MD Phone: [phone]  Fax: [phone]  -  This note was partially dictated with voice recognition software. Similar sounding words can be transcribed inadequately or may not  be corrected upon review.  07/23/2019, 2:25 PM

## 2019-08-28 ENCOUNTER — Telehealth: Payer: Self-pay | Admitting: Adult Health

## 2019-08-28 NOTE — Telephone Encounter (Signed)

## 2019-09-01 ENCOUNTER — Other Ambulatory Visit (HOSPITAL_COMMUNITY): Payer: Self-pay | Admitting: Neurology

## 2019-09-01 ENCOUNTER — Other Ambulatory Visit: Payer: Self-pay | Admitting: Neurology

## 2019-09-01 ENCOUNTER — Other Ambulatory Visit: Payer: Medicaid Other | Admitting: Adult Health

## 2019-09-01 DIAGNOSIS — G35 Multiple sclerosis: Secondary | ICD-10-CM

## 2019-09-12 ENCOUNTER — Encounter (HOSPITAL_COMMUNITY): Payer: Self-pay

## 2019-09-12 ENCOUNTER — Ambulatory Visit (HOSPITAL_COMMUNITY): Payer: Medicaid Other

## 2019-10-10 ENCOUNTER — Telehealth: Payer: Self-pay | Admitting: Adult Health

## 2019-10-10 NOTE — Telephone Encounter (Signed)

## 2019-10-13 ENCOUNTER — Ambulatory Visit: Payer: Medicaid Other | Admitting: Adult Health

## 2019-10-13 ENCOUNTER — Other Ambulatory Visit (HOSPITAL_COMMUNITY)
Admission: RE | Admit: 2019-10-13 | Discharge: 2019-10-13 | Disposition: A | Discharge status: Home / Self Care | Payer: Medicaid Other | Source: Ambulatory Visit | Attending: Adult Health | Admitting: Adult Health

## 2019-10-13 ENCOUNTER — Encounter: Payer: Self-pay | Admitting: Adult Health

## 2019-10-13 ENCOUNTER — Other Ambulatory Visit: Payer: Self-pay

## 2019-10-13 VITALS — BP 147/96 | HR 79 | Ht 64.0 in | Wt 197.2 lb

## 2019-10-13 DIAGNOSIS — Z0000 Encounter for general adult medical examination without abnormal findings: Secondary | ICD-10-CM

## 2019-10-13 DIAGNOSIS — Z01419 Encounter for gynecological examination (general) (routine) without abnormal findings: Secondary | ICD-10-CM | POA: Insufficient documentation

## 2019-10-13 DIAGNOSIS — Z1212 Encounter for screening for malignant neoplasm of rectum: Secondary | ICD-10-CM

## 2019-10-13 DIAGNOSIS — Z1211 Encounter for screening for malignant neoplasm of colon: Secondary | ICD-10-CM | POA: Diagnosis not present

## 2019-10-13 LAB — HEMOCCULT GUIAC POC 1CARD (OFFICE): Fecal Occult Blood, POC: NEGATIVE

## 2019-10-13 NOTE — Progress Notes (Signed)
Patient ID: Dawn Barnes, female   DOB: 1966/04/20, 54 y.o.   MRN: [id number] History of Present Illness: Dawn Barnes is a 54 year old white female,she says divorced,but actually legally separated,he is in prison, PM in for a well woman gyn exam and pap.  She is grandma now and is happy.  PCP is Dr Legrand Rams.   Current Medications, Allergies, Past Medical History, Past Surgical History, Family History and Social History were reviewed in [employer] record.     Review of Systems: Patient denies any headaches, hearing loss, fatigue, blurred vision, shortness of breath, chest pain, abdominal pain, problems with bowel movements, urination, or intercourse(not having sex). No joint pain or mood swings. Had [place] in 09/03/19.   Physical Exam:BP (!) 147/96 (BP Location: Left Arm, Patient Position: Sitting, Cuff Size: Normal)   Pulse 79   Ht 5\' 4"  (1.626 m)   Wt 197 lb 3.2 oz (89.4 kg)   BMI 33.85 kg/m  General:  Well developed, well nourished, no acute distress Skin:  Warm and dry Neck:  Midline trachea, normal thyroid, good ROM, no lymphadenopathy Lungs; Clear to auscultation bilaterally Breast:  No dominant palpable mass, retraction, or nipple discharge Cardiovascular: Regular rate and rhythm Abdomen:  Soft, non tender, no hepatosplenomegaly Pelvic:  External genitalia is normal in appearance, no lesions.  The vagina is normal in appearance. Urethra has no lesions or masses. The cervix is bulbous.Pap with GC/CHL and high risk HPV 16/18 genotyping  Performed.  Uterus is felt to be normal size, shape, and contour.  No adnexal masses or tenderness noted.Bladder is non tender, no masses felt. Rectal: Fair sphincter tone, no polyps, or hemorrhoids felt.  Hemoccult negative. Extremities/musculoskeletal:  No swelling or varicosities noted, no clubbing or cyanosis Psych:  No mood changes, alert and cooperative,seems happy Fall risk is low PHQ 2 score is 0. Examination chaperoned by  Rolena Infante LPN.   Impression and Plan: 1. Encounter for gynecological examination with Papanicolaou smear of cervix Pap sent  Physical with Dr Legrand Rams Pap in 3 years if normal Mammogram yearly Labs with PCP   2. Screening for colorectal cancer Colonoscopy per GI

## 2019-10-14 ENCOUNTER — Other Ambulatory Visit: Payer: Self-pay | Admitting: "Endocrinology

## 2019-10-15 ENCOUNTER — Telehealth: Payer: Self-pay | Admitting: Adult Health

## 2019-10-15 LAB — CYTOLOGY - PAP
Chlamydia: NEGATIVE
Comment: NEGATIVE
Comment: NEGATIVE
Comment: NORMAL
Diagnosis: NEGATIVE
High risk HPV: NEGATIVE
Neisseria Gonorrhea: NEGATIVE

## 2019-10-15 MED ORDER — METRONIDAZOLE 500 MG PO TABS: 500.0000 mg | ORAL_TABLET | Freq: Two times a day (BID) | ORAL | 0 refills | Status: DC

## 2019-10-15 NOTE — Telephone Encounter (Signed)
Pt aware that pap is negative for malignancy and HPV,GC/chlamydia but +BV will rx flagyl

## 2019-10-17 ENCOUNTER — Ambulatory Visit (HOSPITAL_COMMUNITY): Payer: Medicaid Other

## 2019-11-04 NOTE — Progress Notes (Signed)
REVIEWED-NO ADDITIONAL RECOMMENDATIONS. 

## 2019-11-17 LAB — T4, FREE: Free T4: 1.3 ng/dL (ref 0.8–1.8)

## 2019-11-17 LAB — TSH: TSH: 0.4 mIU/L

## 2019-11-18 ENCOUNTER — Ambulatory Visit (HOSPITAL_COMMUNITY): Payer: Medicaid Other

## 2019-11-25 ENCOUNTER — Encounter: Payer: Self-pay | Admitting: "Endocrinology

## 2019-11-25 ENCOUNTER — Ambulatory Visit (INDEPENDENT_AMBULATORY_CARE_PROVIDER_SITE_OTHER): Payer: Medicaid Other | Admitting: "Endocrinology

## 2019-11-25 ENCOUNTER — Other Ambulatory Visit: Payer: Self-pay

## 2019-11-25 VITALS — BP 137/86 | HR 72 | Ht 64.0 in | Wt 197.8 lb

## 2019-11-25 DIAGNOSIS — R7303 Prediabetes: Secondary | ICD-10-CM

## 2019-11-25 DIAGNOSIS — E038 Other specified hypothyroidism: Secondary | ICD-10-CM | POA: Diagnosis not present

## 2019-11-25 DIAGNOSIS — E559 Vitamin D deficiency, unspecified: Secondary | ICD-10-CM | POA: Diagnosis not present

## 2019-11-25 MED ORDER — LEVOTHYROXINE SODIUM 88 MCG PO TABS: ORAL_TABLET | ORAL | 1 refills | Status: DC

## 2019-11-25 NOTE — Patient Instructions (Signed)

## 2019-11-25 NOTE — Progress Notes (Signed)
11/25/2019  Endocrinology follow-up note    Subjective:    Patient ID: Dawn Barnes, female    DOB: Jan 14, 1966, PCP Rosita Fire, MD   Past Medical History:  Diagnosis Date  . Anxiety   . Constipation 04/30/2013  . Depression   . Diabetes mellitus without complication (HCC)    borderline  . Fibroids, intramural 01/21/2016  . GERD (gastroesophageal reflux disease)   . Heartburn   . Hot flashes 01/11/2016  . Mixed incontinence   . MS (multiple sclerosis) ([place])   . Pancreatitis   . PONV (postoperative nausea and vomiting)   . PTSD (post-traumatic stress disorder)   . Thyroid disease   . Urge urinary incontinence 05/10/2015  . Vaginal bleeding 01/11/2016   Past Surgical History:  Procedure Laterality Date  . BIOPSY  12/07/2015   Procedure: BIOPSY;  Surgeon: Danie Binder, MD;  [facility];  Service: Endoscopy;;  right and left colon biopsies, gastric bx's   . COLONOSCOPY WITH PROPOFOL N/A 12/07/2015   Dr. Oneida Alar: mild proctitis, hyperplastic polyp   . ENDOMETRIAL ABLATION    . ESOPHAGOGASTRODUODENOSCOPY (EGD) WITH PROPOFOL N/A 12/07/2015   Dr. 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   . INCONTINENCE SURGERY    . SAVORY DILATION N/A 12/07/2015   Procedure: SAVORY DILATION;  Surgeon: Danie Binder, MD;  Location[facility];  Service: Endoscopy;  Laterality: N/A;  . TUBAL LIGATION     Social History   Socioeconomic History  . Marital status: Legally Separated    Spouse name: Not on file  . Number of children: Not on file  . Years of education: Not on file  . Highest education level: Not on file  Occupational History  . Occupation: disability  Tobacco Use  . Smoking status: Never Smoker  . Smokeless tobacco: Never Used  Substance and Sexual Activity  . Alcohol use: No    Alcohol/week: 0.0 standard drinks  . Drug use: No  . Sexual activity: Not Currently    Birth control/protection: Surgical   Comment: tubal and ablation  Other Topics Concern  . Not on file  Social History Narrative  . Not on file   Social Determinants of Health   Financial Resource Strain:   . Difficulty of Paying Living Expenses:   Food Insecurity:   . Worried About Charity fundraiser in the Last Year:   . Arboriculturist in the Last Year:   Transportation Needs:   . Film/video editor (Medical):   Marland Kitchen Lack of Transportation (Non-Medical):   Physical Activity:   . Days of Exercise per Week:   . Minutes of Exercise per Session:   Stress:   . Feeling of Stress :   Social Connections:   . Frequency of Communication with Friends and Family:   . Frequency of Social Gatherings with Friends and Family:   . Attends Religious Services:   . Active Member of Clubs or Organizations:   . Attends Archivist Meetings:   Marland Kitchen Marital Status:    Outpatient Encounter Medications as of 11/25/2019  Medication Sig  . acetaminophen (TYLENOL) 325 MG tablet Take 650 mg by mouth every 6 (six) hours as needed.  . cetirizine (ZYRTEC) 10 MG tablet Take 10 mg by mouth daily.  . Dimethyl Fumarate (TECFIDERA) 240 MG CPDR Take 240 mg by mouth 2 (two) times daily.  Marland Kitchen escitalopram (LEXAPRO) 20 MG tablet Take 20 mg by mouth daily.  Marland Kitchen  fesoterodine (TOVIAZ) 4 MG TB24 tablet Take 8 mg by mouth daily.   Marland Kitchen levothyroxine (SYNTHROID) 88 MCG tablet TAKE 1 TABLET BY MOUTH DAILY BEFORE BREAKFAST.  Marland Kitchen linaclotide (LINZESS) 290 MCG CAPS capsule Take 1 capsule (290 mcg total) by mouth daily before breakfast.  . methocarbamol (ROBAXIN) 500 MG tablet Take 500 mg by mouth 2 (two) times daily.  . metroNIDAZOLE (FLAGYL) 500 MG tablet Take 1 tablet (500 mg total) by mouth 2 (two) times daily.  Marland Kitchen omeprazole (PRILOSEC) 20 MG capsule Take 20 mg by mouth daily.   . RESTASIS 0.05 % ophthalmic emulsion 1 drop 2 (two) times daily.  Marland Kitchen UNABLE TO FIND Vit B 12 injection-every  month  . [DISCONTINUED] levothyroxine (SYNTHROID) 88 MCG tablet TAKE 1  TABLET BY MOUTH DAILY BEFORE BREAKFAST.  . [DISCONTINUED] Vitamin D, Ergocalciferol, (DRISDOL) 1.25 MG (50000 UNIT) CAPS capsule TAKE 1 CAPSULE BY MOUTH EVERY 7 DAYS.   No facility-administered encounter medications on file as of 11/25/2019.   ALLERGIES: Allergies  Allergen Reactions  . Nsaids Other (See Comments)    Pt states MD told her not to take due to stomach irritation  . Tuberculin Tests Dermatitis   VACCINATION STATUS: Immunization History  Administered Date(s) Administered  . Influenza-Unspecified 04/30/2014, 03/15/2015    HPI  54 year old female patient with medical history of hypothyroidism from Hashimoto's thyroiditis.  -She is currently on levothyroxine 88 mcg p.o. daily before breakfast.   she continues to tolerate this medication.  She has reasonable compliance to her medication.  She has no new complaints today.  She is reversing some of the weight loss she had unintentionally before her last visit.    -She denies heat intolerance, palpitations, tremors, nor anxiety. -She has a long-standing problem with dry skin, still not using Vaseline or any moisturizer on a regular basis.  She also has history of prediabetes not on medications.  She is also on ongoing supplement for vitamin D deficiency.   Review of Systems   Limited as above.  Objective:    BP 137/86   Pulse 72   Ht 5\' 4"  (1.626 m)   Wt 197 lb 12.8 oz (89.7 kg)   BMI 33.95 kg/m   Wt Readings from Last 3 Encounters:  11/25/19 197 lb 12.8 oz (89.7 kg)  10/13/19 197 lb 3.2 oz (89.4 kg)  06/11/19 193 lb 6.4 oz (87.7 kg)    Physical Exam   Physical Exam- Limited  Constitutional:  Body mass index is 33.95 kg/m. , not in acute distress, normal state of mind Eyes:  EOMI, no exophthalmos Neck: Supple Thyroid: No gross goiter Respiratory: Adequate breathing efforts Musculoskeletal: no gross deformities, strength intact in all four extremities, no gross restriction of joint movements Skin:  no  rashes, no hyperemia Neurological: no tremor with outstretched hands,    Recent Results (from the past 2160 hour(s))  Cytology - PAP( [place])     Status: None   Collection Time: 10/13/19 10:56 AM  Result Value Ref Range   High risk HPV Negative    Neisseria Gonorrhea Negative    Chlamydia Negative    Adequacy      Satisfactory for evaluation; transformation zone component PRESENT.   Diagnosis      - Negative for intraepithelial lesion or malignancy (NILM)   Microorganisms Shift in flora suggestive of bacterial vaginosis    Comment Normal Reference Ranger Chlamydia - Negative    Comment      Normal Reference Range Neisseria Gonorrhea - Negative  Comment Normal Reference Range HPV - Negative   POCT occult blood stool     Status: None   Collection Time: 10/13/19 11:17 AM  Result Value Ref Range   Fecal Occult Blood, POC Negative Negative   Card #1 Date     Card #2 Fecal Occult Blod, POC     Card #2 Date     Card #3 Fecal Occult Blood, POC     Card #3 Date    TSH SOLSTAS     Status: None   Collection Time: 11/17/19  3:10 PM  Result Value Ref Range   TSH 0.40 mIU/L    Comment:           Reference Range .           > or = 20 Years  0.40-4.50 .                Pregnancy Ranges           First trimester    0.26-2.66           Second trimester   0.55-2.73           Third trimester    0.43-2.91   T4, free SOLSTAS     Status: None   Collection Time: 11/17/19  3:10 PM  Result Value Ref Range   Free T4 1.3 0.8 - 1.8 ng/dL      Assessment & Plan:   1. Hypothyroidism due to Hashimoto's thyroiditis -Her previsit labs are consistent with appropriate replacement.  She is advised to continue levothyroxine 88 mcg daily before breakfast.       - We discussed about the correct intake of her thyroid hormone, on empty stomach at fasting, with water, separated by at least 30 minutes from breakfast and other medications,  and separated by more than 4 hours from calcium, iron,  multivitamins, acid reflux medications (PPIs). -Patient is made aware of the fact that thyroid hormone replacement is needed for life, dose to be adjusted by periodic monitoring of thyroid function tests.   2. Pre-diabetes -Her last  A1c was 5.4%. She will not require medication intervention at this time.  Weight loss should be helping.  She is encouraged to stay active and avoid simple/processed carbohydrates.  3.  Vitamin D deficiency-her vitamin D is now replete.  She is advised to finish her current supplies and discontinue.  She will have repeat 25 hydroxy vitamin D measurement before her next visit.   - I advised patient to maintain close follow up with Rosita Fire, MD for primary care needs.     - Time spent on this patient care encounter:  25 minutes of which 50% was spent in  counseling and the rest reviewing  her current and  previous labs / studies and medications  doses and developing a plan for long term care. Audelia Acton  participated in the discussions, expressed understanding, and voiced agreement with the above plans.  All questions were answered to her satisfaction. she is encouraged to contact clinic should she have any questions or concerns prior to her return visit.    Follow up plan: Return in about 6 months (around 05/26/2020) for Follow up with Pre-visit Labs, Next Visit A1c in Office.  Glade Lloyd, MD Phone: [phone]  Fax: [phone]  -  This note was partially dictated with voice recognition software. Similar sounding words can be transcribed inadequately or may not  be corrected upon review.  11/25/2019, 5:13 PM

## 2019-12-08 ENCOUNTER — Telehealth: Payer: Self-pay | Admitting: *Deleted

## 2019-12-08 NOTE — Telephone Encounter (Signed)
Pt consented to a virtual visit. 

## 2019-12-08 NOTE — Telephone Encounter (Signed)
Dawn Barnes, you are scheduled for a virtual visit with your provider today.  Just as we do with appointments in the office, we must obtain your consent to participate.  Your consent will be active for this visit and any virtual visit you may have with one of our providers in the next 365 days.  If you have a MyChart account, I can also send a copy of this consent to you electronically.  All virtual visits are billed to your insurance company just like a traditional visit in the office.  As this is a virtual visit, video technology does not allow for your provider to perform a traditional examination.  This may limit your provider's ability to fully assess your condition.  If your provider identifies any concerns that need to be evaluated in person or the need to arrange testing such as labs, EKG, etc, we will make arrangements to do so.  Although advances in technology are sophisticated, we cannot ensure that it will always work on either your end or our end.  If the connection with a video visit is poor, we may have to switch to a telephone visit.  With either a video or telephone visit, we are not always able to ensure that we have a secure connection.   I need to obtain your verbal consent now.   Are you willing to proceed with your visit today?

## 2019-12-08 NOTE — Progress Notes (Signed)
Primary Care Physician:  Rosita Fire, MD  Primary GI: Dr. Oneida Alar   Patient Location: Home   Provider Location: [facility] office   Reason for Visit: Follow-up    Persons present on the virtual encounter, with roles:    Total time (minutes) spent on medical discussion: 15 minutes   Due to COVID-19, visit was conducted using virtual method.  Visit was requested by patient.  Virtual Visit via Telephone Note Due to COVID-19, visit is conducted virtually and was requested by patient.   I connected with Dawn Barnes on 12/09/19 at  1:30 PM EDT by telephone and verified that I am speaking with the correct person using two identifiers.   I discussed the limitations, risks, security and privacy concerns of performing an evaluation and management service by telephone and the availability of in person appointments. I also discussed with the patient that there may be a patient responsible charge related to this service. The patient expressed understanding and agreed to proceed.  Chief Complaint  Patient presents with  . Follow-up    Gerd, having some constipation     History of Present Illness:  Dawn Barnes is a 54 year old female with a history of dysphagia likely secondary to web and/or poor dentition, chronic constipation and GERD. Colonoscopy with mild proctitis in setting of NSAIDs. Edentulous.Last EGD in May 2017. Concern for weight loss in the past but now resolved with adjustment in thyroid medications.   Linzess 290 mcg daily for constipation. BM a few times per week. Found chewable fiber tablets but hasn't started yet (Benefiber chewable tablets).   Prilosec once daily for GERD. Controls GERD. No dysphagia.   Wt recently 199.   She tells me her sister, age 26, was recently diagnosed with what she believes was colon cancer.    Past Medical History:  Diagnosis Date  . Anxiety   . Constipation 04/30/2013  . Depression   . Diabetes mellitus without complication (HCC)     borderline  . Fibroids, intramural 01/21/2016  . GERD (gastroesophageal reflux disease)   . Heartburn   . Hot flashes 01/11/2016  . Mixed incontinence   . MS (multiple sclerosis) ([place])   . Pancreatitis   . PONV (postoperative nausea and vomiting)   . PTSD (post-traumatic stress disorder)   . Thyroid disease   . Urge urinary incontinence 05/10/2015  . Vaginal bleeding 01/11/2016     Past Surgical History:  Procedure Laterality Date  . BIOPSY  12/07/2015   Procedure: BIOPSY;  Surgeon: Danie Binder, MD;  [facility];  Service: Endoscopy;;  right and left colon biopsies, gastric bx's   . COLONOSCOPY WITH PROPOFOL N/A 12/07/2015   Dr. Oneida Alar: mild proctitis, hyperplastic polyp   . ENDOMETRIAL ABLATION    . ESOPHAGOGASTRODUODENOSCOPY (EGD) WITH PROPOFOL N/A 12/07/2015   Dr. 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   . INCONTINENCE SURGERY    . SAVORY DILATION N/A 12/07/2015   Procedure: SAVORY DILATION;  Surgeon: Danie Binder, MD;  [facility];  Service: Endoscopy;  Laterality: N/A;  . TUBAL LIGATION       Current Meds  Medication Sig  . acetaminophen (TYLENOL) 325 MG tablet Take 650 mg by mouth as needed.   . cetirizine (ZYRTEC) 10 MG tablet Take 10 mg by mouth daily.  . Dimethyl Fumarate (TECFIDERA) 240 MG CPDR Take 240 mg by mouth 2 (two) times daily.  Marland Kitchen escitalopram (LEXAPRO)  20 MG tablet Take 20 mg by mouth daily.  . fesoterodine (TOVIAZ) 4 MG TB24 tablet Take 8 mg by mouth daily.   Marland Kitchen gabapentin (NEURONTIN) 300 MG capsule Take 300 mg by mouth in the morning and at bedtime.  Marland Kitchen HYDROcodone-acetaminophen (NORCO/VICODIN) 5-325 MG tablet Take 1 tablet by mouth 2 (two) times daily as needed for moderate pain.  Marland Kitchen levothyroxine (SYNTHROID) 88 MCG tablet TAKE 1 TABLET BY MOUTH DAILY BEFORE BREAKFAST.  Marland Kitchen linaclotide (LINZESS) 290 MCG CAPS capsule Take 1 capsule (290 mcg total) by mouth daily before breakfast.    . methocarbamol (ROBAXIN) 500 MG tablet Take 500 mg by mouth 2 (two) times daily.  Marland Kitchen omeprazole (PRILOSEC) 20 MG capsule Take 20 mg by mouth daily.   . predniSONE (DELTASONE) 5 MG tablet Take 5 mg by mouth daily with breakfast. Taper down starting at 6 tablets for 2 days.  5 tablets for 2 days.  4 tablets for 2 days.  3 tablets for 2 days.  2 tablets for 2 days. 1 tablet for 2 days.  . RESTASIS 0.05 % ophthalmic emulsion 1 drop 2 (two) times daily.  Marland Kitchen UNABLE TO FIND Vit B 12 injection-as needed.  . Vitamin D, Ergocalciferol, (DRISDOL) 1.25 MG (50000 UNIT) CAPS capsule Take 50,000 Units by mouth every 7 (seven) days.     Family History  Problem Relation Age of Onset  . Diabetes Father   . Heart disease Father   . Hypertension Paternal Aunt   . Heart disease Paternal Aunt   . Seizures Paternal Aunt   . Breast cancer Paternal Aunt   . Heart disease Paternal Grandmother   . Depression Daughter   . Hypertension Sister   . Heart disease Sister   . Colon cancer Sister 50  . Depression Son   . ADD / ADHD Son   . Depression Daughter     Social History   Socioeconomic History  . Marital status: Legally Separated    Spouse name: Not on file  . Number of children: Not on file  . Years of education: Not on file  . Highest education level: Not on file  Occupational History  . Occupation: disability  Tobacco Use  . Smoking status: Never Smoker  . Smokeless tobacco: Never Used  Substance and Sexual Activity  . Alcohol use: No    Alcohol/week: 0.0 standard drinks  . Drug use: No  . Sexual activity: Not Currently    Birth control/protection: Surgical    Comment: tubal and ablation  Other Topics Concern  . Not on file  Social History Narrative  . Not on file   Social Determinants of Health   Financial Resource Strain:   . Difficulty of Paying Living Expenses:   Food Insecurity:   . Worried About Charity fundraiser in the Last Year:   . Arboriculturist in the Last Year:    Transportation Needs:   . Film/video editor (Medical):   Marland Kitchen Lack of Transportation (Non-Medical):   Physical Activity:   . Days of Exercise per Week:   . Minutes of Exercise per Session:   Stress:   . Feeling of Stress :   Social Connections:   . Frequency of Communication with Friends and Family:   . Frequency of Social Gatherings with Friends and Family:   . Attends Religious Services:   . Active Member of Clubs or Organizations:   . Attends Archivist Meetings:   Marland Kitchen Marital Status:  Review of Systems: Gen: Denies fever, chills, anorexia. Denies fatigue, weakness, weight loss.  CV: Denies chest pain, palpitations, syncope, peripheral edema, and claudication. Resp: Denies dyspnea at rest, cough, wheezing, coughing up blood, and pleurisy. GI: see HPI Derm: Denies rash, itching, dry skin Psych: Denies depression, anxiety, memory loss, confusion. No homicidal or suicidal ideation.  Heme: Denies bruising, bleeding, and enlarged lymph nodes.  Observations/Objective: No distress. Unable to perform physical exam due to telephone encounter. No video available.   Assessment and Plan: 54 year old female with history of constipation, dysphagia, GERD, presenting via telephone visit for routine follow-up.  Dysphagia: no concerns.  GERD: continue with Prilosec daily. Controlling symptoms.  Constipation: Linzess 290 mcg daily. Still not ideally managed. Start supplemental fiber (3 benefiber tablets daily and increase as tolerated). Miralax if needed.   Return in 6 months or sooner if needed. Colonoscopy will now be next year, as she reports recent diagnosis of colon cancer in her sister, early 42s.   Follow Up Instructions: See AVS    I discussed the assessment and treatment plan with the patient. The patient was provided an opportunity to ask questions and all were answered. The patient agreed with the plan and demonstrated an understanding of the instructions.    The patient was advised to call back or seek an in-person evaluation if the symptoms worsen or if the condition fails to improve as anticipated.  I provided 15 minutes of non-face-to-face time during this encounter.  Annitta Needs, PhD, ANP-BC [facility]

## 2019-12-09 ENCOUNTER — Other Ambulatory Visit: Payer: Self-pay

## 2019-12-09 ENCOUNTER — Telehealth (INDEPENDENT_AMBULATORY_CARE_PROVIDER_SITE_OTHER): Payer: Medicaid Other | Admitting: Gastroenterology

## 2019-12-09 ENCOUNTER — Encounter: Payer: Self-pay | Admitting: Gastroenterology

## 2019-12-09 DIAGNOSIS — K219 Gastro-esophageal reflux disease without esophagitis: Secondary | ICD-10-CM | POA: Diagnosis not present

## 2019-12-09 DIAGNOSIS — K5900 Constipation, unspecified: Secondary | ICD-10-CM

## 2019-12-09 NOTE — Patient Instructions (Signed)
We will see you in 6 months!  Continue Linzess once each morning. Add 3 tablets benefiber daily. You can increase this to twice a day if needed. Monitor for gas, bloating, looser stool.  Your next colonoscopy will be in 2022!   Annitta Needs, PhD, ANP-BC [facility]

## 2019-12-11 ENCOUNTER — Other Ambulatory Visit: Payer: Self-pay

## 2019-12-11 ENCOUNTER — Ambulatory Visit (HOSPITAL_COMMUNITY)
Admission: RE | Admit: 2019-12-11 | Discharge: 2019-12-11 | Disposition: A | Discharge status: Home / Self Care | Payer: Medicaid Other | Source: Ambulatory Visit | Attending: Neurology | Admitting: Neurology

## 2019-12-11 DIAGNOSIS — G35 Multiple sclerosis: Secondary | ICD-10-CM

## 2020-01-12 ENCOUNTER — Other Ambulatory Visit: Payer: Self-pay | Admitting: "Endocrinology

## 2020-02-10 ENCOUNTER — Other Ambulatory Visit: Payer: Self-pay | Admitting: Gastroenterology

## 2020-03-17 ENCOUNTER — Other Ambulatory Visit: Payer: Self-pay | Admitting: "Endocrinology

## 2020-03-18 ENCOUNTER — Ambulatory Visit (HOSPITAL_COMMUNITY): Payer: Medicaid Other | Attending: Neurology | Admitting: Physical Therapy

## 2020-03-18 ENCOUNTER — Other Ambulatory Visit: Payer: Self-pay

## 2020-03-18 ENCOUNTER — Encounter (HOSPITAL_COMMUNITY): Payer: Self-pay | Admitting: Physical Therapy

## 2020-03-18 DIAGNOSIS — M6281 Muscle weakness (generalized): Secondary | ICD-10-CM | POA: Diagnosis present

## 2020-03-18 DIAGNOSIS — R262 Difficulty in walking, not elsewhere classified: Secondary | ICD-10-CM | POA: Diagnosis present

## 2020-03-18 NOTE — Therapy (Signed)
[place], [place], [address] Phone: [phone]   Fax:  [phone]  Physical Therapy Evaluation  Patient Details  Name: Dawn Barnes MRN: [id number] Date of Birth: May 02, 1966 Referring Provider (PT): Phillips Odor   Encounter Date: 03/18/2020   PT End of Session - 03/18/20 1430    Visit Number 1    Number of Visits 8    Date for PT Re-Evaluation 04/15/20    Authorization Type Medicaid [place]    Authorization - Visit Number 1    Authorization - Number of Visits 1    Progress Note Due on Visit 10    PT Start Time 1400    PT Stop Time 1435    PT Time Calculation (min) 35 min    Equipment Utilized During Treatment Gait belt    Activity Tolerance No increased pain;Patient tolerated treatment well    Behavior During Therapy [facility] for tasks assessed/performed           Past Medical History:  Diagnosis Date   Anxiety    Constipation 04/30/2013   Depression    Diabetes mellitus without complication (HCC)    borderline   Fibroids, intramural 01/21/2016   GERD (gastroesophageal reflux disease)    Heartburn    Hot flashes 01/11/2016   Mixed incontinence    MS (multiple sclerosis) (HCC)    Pancreatitis    PONV (postoperative nausea and vomiting)    PTSD (post-traumatic stress disorder)    Thyroid disease    Urge urinary incontinence 05/10/2015   Vaginal bleeding 01/11/2016    Past Surgical History:  Procedure Laterality Date   BIOPSY  12/07/2015   Procedure: BIOPSY;  Surgeon: Danie Binder, MD;  [facility];  Service: Endoscopy;;  right and left colon biopsies, gastric bx's    COLONOSCOPY WITH PROPOFOL N/A 12/07/2015   Dr. Oneida Alar: mild proctitis, hyperplastic polyp    ENDOMETRIAL ABLATION     ESOPHAGOGASTRODUODENOSCOPY (EGD) WITH PROPOFOL N/A 12/07/2015   Dr. 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    INCONTINENCE  SURGERY     SAVORY DILATION N/A 12/07/2015   Procedure: SAVORY DILATION;  Surgeon: Danie Binder, MD;  [facility];  Service: Endoscopy;  Laterality: N/A;   TUBAL LIGATION      There were no vitals filed for this visit.    Subjective Assessment - 03/18/20 1406    Subjective Patient reports that she has MS and she can lose her balance and direction at times. States she also has carpal tunnel in both wrists and she typically wears braces to help with that. States she thinks sometimes she falls to the right but doesnt feel she has a weaker side. States she does not use an assistive device. States she was diagnosed with MS in 2016. States that she has numbness in both legs with R>L and takes medication with that. States she also has pain in her neck and feet. States every now and then she gets flushed from her medication. Reported reduced energy and being very forgetful at time.    Pertinent History MS, thyroid issues    How long can you walk comfortably? 10    Patient Stated Goals to be able to squat down to the floor, to have stronger legs and feel her balance is better (dynamic)    Currently in Pain? Yes    Pain Score 4     Pain Location  Leg    Pain Orientation Right    Pain Descriptors / Indicators Aching    Aggravating Factors  standing, walking              [facility] PT Assessment - 03/18/20 0001      Assessment   Medical Diagnosis gait issues    Referring Provider (PT) Kofi [place]on[place]ah      Precautions   Precautions Fall      Balance Screen   Has the patient fallen in the past 6 months Yes    How many times? 1    Has the patient had a decrease in activity level because of a fear of falling?  No    Is the patient reluctant to leave their home because of a fear of falling?  No      Home Environment   Living Environment Private residence    Living Arrangements Children    Available Help at Discharge Family    Type of [place] to enter     Entrance Stairs-Number of Steps 4    Entrance Stairs-Rails Right    [place] One level    [place] None      Cognition   Overall Cognitive Status Within Functional Limits for tasks assessed      Observation/Other Assessments   Focus on Therapeutic Outcomes (FOTO)  NA      Ambulation/Gait   Ambulation/Gait Yes    Ambulation/Gait Assistance 6: Modified independent (Device/Increase time)    Ambulation Distance (Feet) 326 Feet    Assistive device None    Gait Pattern Decreased arm swing - left;Decreased arm swing - right;Decreased step length - left;Decreased step length - right;Decreased stride length;Decreased dorsiflexion - right;Decreased dorsiflexion - left   everts legs bilaterally   Ambulation Surface Level;Indoor    Gait velocity decreased     Gait Comments 2MW       Balance   Balance Assessed Yes      Static Standing Balance   Static Standing - Balance Support No upper extremity supported    Static Standing - Level of Assistance 6: Modified independent (Device/Increase time)    Static Standing Balance -  Activities  Single Leg Stance - Right Leg;Single Leg Stance - Left Leg;Tandam Stance - Right Leg;Tandam Stance - Left Leg    Static Standing - Comment/# of Minutes tandem 30 seconds B; R SLS 8 seconds; L SLS 28 seconds      Standardized Balance Assessment   Standardized Balance Assessment Dynamic Gait Index      Dynamic Gait Index   Level Surface Normal    Change in Gait Speed Normal    Gait with Horizontal Head Turns Normal    Gait with Vertical Head Turns Normal    Gait and Pivot Turn Normal    Step Over Obstacle Normal    Step Around Obstacles Normal    Steps Mild Impairment    Total Score 23                      Objective measurements completed on examination: See above findings.       [facility] Adult PT Treatment/Exercise - 03/18/20 0001      Exercises   Exercises Knee/Hip      Knee/Hip Exercises: Seated   Sit to Sand 5 reps;with UE  support   UE on thighs, 4 sets  PT Education - 03/18/20 1446    Education Details on current condition, HEP, POC and defecits noted    Person(s) Educated Patient    Methods Explanation    Comprehension Verbalized understanding            PT Short Term Goals - 03/18/20 1445      PT SHORT TERM GOAL #1   Title Patient will report at least 25% improvement in overall symptoms and/or function to demonstrate improved functional mobility    Time 2    Period Weeks    Status New    Target Date 04/01/20      PT SHORT TERM GOAL #2   Title Patient will be independent in self management strategies to improve quality of life and functional outcomes.    Time 2    Period Weeks    Status New    Target Date 04/01/20             PT Long Term Goals - 03/18/20 1445      PT LONG TERM GOAL #1   Title Patient will be able to squat down to floor and stand back up 5 times to demonstrate improved functional endurance    Time 4    Period Weeks    Status New    Target Date 04/15/20      PT LONG TERM GOAL #2   Title Patient will report at least 50% improvement in overall symptoms and/or function to demonstrate improved functional mobility    Time 4    Period Weeks    Status New    Target Date 04/15/20                  Plan - 03/18/20 1442    Clinical Impression Statement Patient presents to therapy with complaints of weakness and balance deficits. Patient tested strong on DGI and moderate with static balance tests. Functionally patient favors left side and fatigues quickly, no formal strength testing performed on this date secondary to time limitations. Discussed current findings and patient would benefit from skilled physical therapy to improve functional strength and reduce risk of falls.    Personal Factors and Comorbidities Comorbidity 1;Comorbidity 2;Comorbidity 3+    Comorbidities MS, hx of falls, depression    Examination-Activity Limitations Locomotion  Level;Squat    Examination-Participation Restrictions Cleaning;Yard Work    Stability/Clinical Decision Making Stable/Uncomplicated    Designer, jewellery Low    Rehab Potential Good    PT Frequency 2x / week    PT Duration 4 weeks    PT Treatment/Interventions ADLs/Self Care Home Management;Aquatic Therapy;Balance training;Therapeutic exercise;Therapeutic activities;Functional mobility training;Stair training;Gait training;Neuromuscular re-education;Patient/family education;Manual techniques    PT Next Visit Plan functional strength- wants to be able to squat all the way from floor and stand up; LE strength, high level balance tasks - develop HEP for pt and print exercises    PT Home Exercise Plan 8/19 STS           Patient will benefit from skilled therapeutic intervention in order to improve the following deficits and impairments:  Difficulty walking, Decreased mobility, Decreased balance, Decreased activity tolerance, Decreased strength  Visit Diagnosis: Muscle weakness (generalized)  Difficulty in walking, not elsewhere classified     Problem List Patient Active Problem List   Diagnosis Date Noted   Screening for colorectal cancer 10/13/2019   Encounter for gynecological examination with Papanicolaou smear of cervix 10/13/2019   GERD (gastroesophageal reflux disease) 03/12/2019   Loss of weight  07/19/2018   Goiter 03/13/2018   Pelvic pain in female 05/16/2017   History of posttraumatic stress disorder (PTSD) 05/15/2016   Burning with urination 05/15/2016   Encounter for well woman exam with routine gynecological exam 05/15/2016   Fibroids, intramural 01/21/2016   Vaginal bleeding 01/11/2016   Hot flashes 01/11/2016   Rectal bleeding 11/03/2015   Dysphagia 11/03/2015   Pre-diabetes 10/25/2015   Vitamin D deficiency 10/25/2015   Obesity (BMI 30-39.9) 10/25/2015   Urge urinary incontinence 05/10/2015   Radicular low back pain 10/23/2013    Depression 04/30/2013   Hypothyroidism 04/30/2013   Constipation 04/30/2013   2:54 PM, 03/18/20 Jerene Pitch, DPT Physical Therapy with [facility]  [phone] office  [place] [address] [place], [place], [address] Phone: [phone]   Fax:  [phone]  Name: Dawn Barnes MRN: [id number] Date of Birth: [birth year]

## 2020-03-19 ENCOUNTER — Encounter (HOSPITAL_COMMUNITY): Payer: Medicaid Other | Admitting: Physical Therapy

## 2020-03-22 ENCOUNTER — Other Ambulatory Visit: Payer: Self-pay

## 2020-03-22 ENCOUNTER — Ambulatory Visit (HOSPITAL_COMMUNITY): Payer: Medicaid Other

## 2020-03-22 DIAGNOSIS — M6281 Muscle weakness (generalized): Secondary | ICD-10-CM

## 2020-03-22 DIAGNOSIS — R262 Difficulty in walking, not elsewhere classified: Secondary | ICD-10-CM

## 2020-03-22 NOTE — Patient Instructions (Addendum)
Balance: Eyes Open - Unilateral (Varied Surfaces)    Stand on left foot, eyes open. Maintain balance _30___ seconds. Repeat __2__ times per set. Do _1___ sets per session. Do _4-5 ___ sessions per week. Repeat on compliant surface when directed: foam.  Copyright  VHI. All rights reserved.   Single Leg balance with front, side and back opposite leg movements. Hold each position for 5 seconds and do 4 times through in each position.

## 2020-03-22 NOTE — Therapy (Signed)
[place], [place], [address] Phone: [phone]   Fax:  [phone]  Physical Therapy Treatment  Patient Details  Name: Dawn Barnes MRN: [id number] Date of Birth: 15-Jul-1966 Referring Provider (PT): Phillips Odor   Encounter Date: 03/22/2020   PT End of Session - 03/22/20 1009    Visit Number 2    Number of Visits 8    Date for PT Re-Evaluation 04/15/20    Authorization Type Medicaid [place] A    Authorization - Visit Number 2    Authorization - Number of Visits 27   combined PT/OT/SLP   Progress Note Due on Visit 10    PT Start Time 1008    PT Stop Time 1046    PT Time Calculation (min) 38 min    Equipment Utilized During Treatment --    Activity Tolerance No increased pain;Patient tolerated treatment well    Behavior During Therapy [facility] for tasks assessed/performed           Past Medical History:  Diagnosis Date  . Anxiety   . Constipation 04/30/2013  . Depression   . Diabetes mellitus without complication (HCC)    borderline  . Fibroids, intramural 01/21/2016  . GERD (gastroesophageal reflux disease)   . Heartburn   . Hot flashes 01/11/2016  . Mixed incontinence   . MS (multiple sclerosis) ([place])   . Pancreatitis   . PONV (postoperative nausea and vomiting)   . PTSD (post-traumatic stress disorder)   . Thyroid disease   . Urge urinary incontinence 05/10/2015  . Vaginal bleeding 01/11/2016    Past Surgical History:  Procedure Laterality Date  . BIOPSY  12/07/2015   Procedure: BIOPSY;  Surgeon: Danie Binder, MD;  [facility];  Service: Endoscopy;;  right and left colon biopsies, gastric bx's   . COLONOSCOPY WITH PROPOFOL N/A 12/07/2015   Dr. Oneida Alar: mild proctitis, hyperplastic polyp   . ENDOMETRIAL ABLATION    . ESOPHAGOGASTRODUODENOSCOPY (EGD) WITH PROPOFOL N/A 12/07/2015   Dr. 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   .  INCONTINENCE SURGERY    . SAVORY DILATION N/A 12/07/2015   Procedure: SAVORY DILATION;  Surgeon: Danie Binder, MD;  [facility];  Service: Endoscopy;  Laterality: N/A;  . TUBAL LIGATION      There were no vitals filed for this visit.   Subjective Assessment - 03/22/20 1009    Subjective No new complaints today. No pain. No falls since last session.    Pertinent History MS, thyroid issues    How long can you walk comfortably? 10    Patient Stated Goals to be able to squat down to the floor, to have stronger legs and feel her balance is better (dynamic)    Currently in Pain? No/denies             [facility] Adult PT Treatment/Exercise - 03/22/20 0001      Knee/Hip Exercises: Standing   SLS 30 sec x 2    SLS with Vectors 5 sec hold 4RT bilaterally    Other Standing Knee Exercises tandem on foam 3x30 sec each    Other Standing Knee Exercises Pallof press in tandem w/ GTB x20 reps             PT Education - 03/22/20 1245    Education Details Reviewed evaluation and goals. Discussed purpose and technique of interventions throughout session.    Person(s) Educated  Patient    Methods Explanation;Handout;Demonstration    Comprehension Verbalized understanding;Need further instruction            PT Short Term Goals - 03/18/20 1445      PT SHORT TERM GOAL #1   Title Patient will report at least 25% improvement in overall symptoms and/or function to demonstrate improved functional mobility    Time 2    Period Weeks    Status New    Target Date 04/01/20      PT SHORT TERM GOAL #2   Title Patient will be independent in self management strategies to improve quality of life and functional outcomes.    Time 2    Period Weeks    Status New    Target Date 04/01/20             PT Long Term Goals - 03/18/20 1445      PT LONG TERM GOAL #1   Title Patient will be able to squat down to floor and stand back up 5 times to demonstrate improved functional endurance     Time 4    Period Weeks    Status New    Target Date 04/15/20      PT LONG TERM GOAL #2   Title Patient will report at least 50% improvement in overall symptoms and/or function to demonstrate improved functional mobility    Time 4    Period Weeks    Status New    Target Date 04/15/20                 Plan - 03/22/20 1248    Clinical Impression Statement Reviewed initial evaluation and goals. Initiated therapeutic exercises for functional and lower extremity strengthening and balance training. Added SLS, SLS with vectors, tandem balance on foam and Pallof press in tandem with GTB. Added SLS and SLS with vectors to HEP. Patient stated her felt unbalance during some of the exercises with quivering leg muscles but no other complaints. Continue with current plan, progress ther ex and HEP as able. Progress to activities that will compliment her goal to be able to get up off the floor.    Personal Factors and Comorbidities Comorbidity 1;Comorbidity 2;Comorbidity 3+    Comorbidities MS, hx of falls, depression    Examination-Activity Limitations Locomotion Level;Squat    Examination-Participation Restrictions Cleaning;Yard Work    Stability/Clinical Decision Making Stable/Uncomplicated    Rehab Potential Good    PT Frequency 2x / week    PT Duration 4 weeks    PT Treatment/Interventions ADLs/Self Care Home Management;Aquatic Therapy;Balance training;Therapeutic exercise;Therapeutic activities;Functional mobility training;Stair training;Gait training;Neuromuscular re-education;Patient/family education;Manual techniques    PT Next Visit Plan functional strength- wants to be able to squat all the way from floor and stand up; LE strength, high level balance tasks - develop HEP for pt and print exercises    PT Home Exercise Plan 8/19 STS; 03/22/2020 - SLS, SLS w/ vectors           Patient will benefit from skilled therapeutic intervention in order to improve the following deficits and  impairments:  Difficulty walking, Decreased mobility, Decreased balance, Decreased activity tolerance, Decreased strength  Visit Diagnosis: Muscle weakness (generalized)  Difficulty in walking, not elsewhere classified     Problem List Patient Active Problem List   Diagnosis Date Noted  . Screening for colorectal cancer 10/13/2019  . Encounter for gynecological examination with Papanicolaou smear of cervix 10/13/2019  . GERD (gastroesophageal reflux disease) 03/12/2019  .  Loss of weight 07/19/2018  . Goiter 03/13/2018  . Pelvic pain in female 05/16/2017  . History of posttraumatic stress disorder (PTSD) 05/15/2016  . Burning with urination 05/15/2016  . Encounter for well woman exam with routine gynecological exam 05/15/2016  . Fibroids, intramural 01/21/2016  . Vaginal bleeding 01/11/2016  . Hot flashes 01/11/2016  . Rectal bleeding 11/03/2015  . Dysphagia 11/03/2015  . Pre-diabetes 10/25/2015  . Vitamin D deficiency 10/25/2015  . Obesity (BMI 30-39.9) 10/25/2015  . Urge urinary incontinence 05/10/2015  . Radicular low back pain 10/23/2013  . Depression 04/30/2013  . Hypothyroidism 04/30/2013  . Constipation 04/30/2013    Floria Raveling. Hartnett-Rands, MS, PT Per [place] #[id number] 03/22/2020, 12:57 PM  [place] [address] [place], [place], [address] Phone: [phone]   Fax:  [phone]  Name: Dawn Barnes MRN: [id number] Date of Birth: May 31, 1966

## 2020-03-26 ENCOUNTER — Ambulatory Visit (HOSPITAL_COMMUNITY): Payer: Medicaid Other

## 2020-03-26 ENCOUNTER — Encounter (HOSPITAL_COMMUNITY): Payer: Self-pay

## 2020-03-26 ENCOUNTER — Other Ambulatory Visit: Payer: Self-pay

## 2020-03-26 DIAGNOSIS — R262 Difficulty in walking, not elsewhere classified: Secondary | ICD-10-CM

## 2020-03-26 DIAGNOSIS — M6281 Muscle weakness (generalized): Secondary | ICD-10-CM | POA: Diagnosis not present

## 2020-03-26 NOTE — Therapy (Signed)
[place], [place], [address] Phone: [phone]   Fax:  [phone]  Physical Therapy Treatment  Patient Details  Name: Dawn Barnes MRN: [id number] Date of Birth: 07/31/1966 Referring Provider (PT): Phillips Odor   Encounter Date: 03/26/2020   PT End of Session - 03/26/20 1103    Visit Number 3    Number of Visits 8    Date for PT Re-Evaluation 04/15/20    Authorization Type Medicaid [place] [employer]    Authorization - Visit Number 3    Authorization - Number of Visits 27   combined PT/OT/SLP   Progress Note Due on Visit 10    PT Start Time 1055    PT Stop Time 1133    PT Time Calculation (min) 38 min    Activity Tolerance No increased pain;Patient tolerated treatment well    Behavior During Therapy [facility] for tasks assessed/performed           Past Medical History:  Diagnosis Date  . Anxiety   . Constipation 04/30/2013  . Depression   . Diabetes mellitus without complication (HCC)    borderline  . Fibroids, intramural 01/21/2016  . GERD (gastroesophageal reflux disease)   . Heartburn   . Hot flashes 01/11/2016  . Mixed incontinence   . MS (multiple sclerosis) ([place])   . Pancreatitis   . PONV (postoperative nausea and vomiting)   . PTSD (post-traumatic stress disorder)   . Thyroid disease   . Urge urinary incontinence 05/10/2015  . Vaginal bleeding 01/11/2016    Past Surgical History:  Procedure Laterality Date  . BIOPSY  12/07/2015   Procedure: BIOPSY;  Surgeon: Danie Binder, MD;  [facility];  Service: Endoscopy;;  right and left colon biopsies, gastric bx's   . COLONOSCOPY WITH PROPOFOL N/A 12/07/2015   Dr. Oneida Alar: mild proctitis, hyperplastic polyp   . ENDOMETRIAL ABLATION    . ESOPHAGOGASTRODUODENOSCOPY (EGD) WITH PROPOFOL N/A 12/07/2015   Dr. 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   . INCONTINENCE SURGERY    . SAVORY DILATION N/A  12/07/2015   Procedure: SAVORY DILATION;  Surgeon: Danie Binder, MD;  [facility];  Service: Endoscopy;  Laterality: N/A;  . TUBAL LIGATION      There were no vitals filed for this visit.   Subjective Assessment - 03/26/20 1054    Subjective feet and legs have been bothering her the last few days. Has had several appointments also so hasn't done her HEP much but when she has, it has gone well with no questions.    Pertinent History MS, thyroid issues    How long can you walk comfortably? 10    Patient Stated Goals to be able to squat down to the floor, to have stronger legs and feel her balance is better (dynamic)              [facility] Adult PT Treatment/Exercise - 03/26/20 0001      Knee/Hip Exercises: Standing   Forward Lunges 2 sets;10 reps;Both    Side Lunges Both;1 set    Side Lunges Limitations w/ BTB around knees 1RT blue line    Hip Abduction Stengthening;Both;1 set;15 reps;Knee straight    Hip Extension Stengthening;Both;1 set;15 reps;Knee straight    Forward Step Up Both;2 sets;10 reps;Step Height: 6"    Functional Squat 2 sets;10 reps    Functional Squat Limitations STS to chair    Other  Standing Knee Exercises tandem on foam 3x30 sec each    Other Standing Knee Exercises Pallof press in tandem w/ GTB x20 reps            PT Education - 03/26/20 1231    Education Details Discussed purpose and technique of interventions throughout session. Progressed HEP.    Person(s) Educated Patient    Methods Explanation;Demonstration;Handout    Comprehension Verbalized understanding;Need further instruction            PT Short Term Goals - 03/26/20 1236      PT SHORT TERM GOAL #1   Title Patient will report at least 25% improvement in overall symptoms and/or function to demonstrate improved functional mobility    Time 2    Period Weeks    Status On-going    Target Date 04/01/20      PT SHORT TERM GOAL #2   Title Patient will be independent in self  management strategies to improve quality of life and functional outcomes.    Time 2    Period Weeks    Status On-going    Target Date 04/01/20             PT Long Term Goals - 03/26/20 1236      PT LONG TERM GOAL #1   Title Patient will be able to squat down to floor and stand back up 5 times to demonstrate improved functional endurance    Time 4    Period Weeks    Status On-going      PT LONG TERM GOAL #2   Title Patient will report at least 50% improvement in overall symptoms and/or function to demonstrate improved functional mobility    Time 4    Period Weeks    Status On-going             Plan - 03/26/20 1104    Clinical Impression Statement Treatment focused on lower extremity strengthening and balance. Added side stepping with blue theraband, standing hip abduction and extension, forward step up 6 inches, and functional squat to chair. Progressed HEP to include standing hip abduction and extension and forward step ups. Patient requires cues to maintain neutral hip, knee and ankle to engage hip musculature during standing exercises. Continue with current plan, progress ther ex and HEP as able. Progress activities that will compliment her goal of getting up off the floor.    Personal Factors and Comorbidities Comorbidity 1;Comorbidity 2;Comorbidity 3+    Comorbidities MS, hx of falls, depression    Examination-Activity Limitations Locomotion Level;Squat    Examination-Participation Restrictions Cleaning;Yard Work    Stability/Clinical Decision Making Stable/Uncomplicated    Rehab Potential Good    PT Frequency 2x / week    PT Duration 4 weeks    PT Treatment/Interventions ADLs/Self Care Home Management;Aquatic Therapy;Balance training;Therapeutic exercise;Therapeutic activities;Functional mobility training;Stair training;Gait training;Neuromuscular re-education;Patient/family education;Manual techniques    PT Next Visit Plan functional strength- wants to be able to squat  all the way from floor and stand up; LE strength, high level balance tasks - develop HEP for pt and print exercises    PT Home Exercise Plan 8/19 STS; 03/22/2020 - SLS, SLS w/ vectors; 03/26/20 - forward step up, stand hip abd/ext.           Patient will benefit from skilled therapeutic intervention in order to improve the following deficits and impairments:  Difficulty walking, Decreased mobility, Decreased balance, Decreased activity tolerance, Decreased strength  Visit Diagnosis: Muscle weakness (generalized)  Difficulty  in walking, not elsewhere classified     Problem List Patient Active Problem List   Diagnosis Date Noted  . Screening for colorectal cancer 10/13/2019  . Encounter for gynecological examination with Papanicolaou smear of cervix 10/13/2019  . GERD (gastroesophageal reflux disease) 03/12/2019  . Loss of weight 07/19/2018  . Goiter 03/13/2018  . Pelvic pain in female 05/16/2017  . History of posttraumatic stress disorder (PTSD) 05/15/2016  . Burning with urination 05/15/2016  . Encounter for well woman exam with routine gynecological exam 05/15/2016  . Fibroids, intramural 01/21/2016  . Vaginal bleeding 01/11/2016  . Hot flashes 01/11/2016  . Rectal bleeding 11/03/2015  . Dysphagia 11/03/2015  . Pre-diabetes 10/25/2015  . Vitamin D deficiency 10/25/2015  . Obesity (BMI 30-39.9) 10/25/2015  . Urge urinary incontinence 05/10/2015  . Radicular low back pain 10/23/2013  . Depression 04/30/2013  . Hypothyroidism 04/30/2013  . Constipation 04/30/2013    Floria Raveling. Hartnett-Rands, MS, PT [facility][id number] 03/26/2020, 12:37 PM  [place] [address] [place], [place], [address] Phone: [phone]   Fax:  [phone]  Name: ARYANA WONNACOTT MRN: [id number] Date of Birth: May 10, 1966

## 2020-03-26 NOTE — Patient Instructions (Signed)
Step: Up, Anterior    Stand facing step. Place involved leg up. Raise body using top leg only. Step down backward, lower body using other leg. Repeat 10-15____ times per set. Do _1___ sets per session. Do _3-4___ sessions per week.  Copyright  VHI. All rights reserved.   HIP: Abduction - Standing (Band)    Place band around legs. Squeeze glutes. Raise leg out and slightly back. Hold 1-2___ seconds. Use __no_ band. 15-20___ reps per set, 1___ sets per day, _3-4__ days per week.  Hold onto a support.  Copyright  VHI. All rights reserved.   Hip Extension (Standing)    Stand with support. Squeeze pelvic floor and hold. Move right leg backward with straight knee. Hold for 1-2___ seconds. Repeat 15-20___ times. Do 1___ times a day. Repeat with other leg.  Copyright  VHI. All rights reserved.

## 2020-03-29 ENCOUNTER — Ambulatory Visit (HOSPITAL_COMMUNITY): Payer: Medicaid Other | Admitting: Physical Therapy

## 2020-03-29 ENCOUNTER — Encounter (HOSPITAL_COMMUNITY): Payer: Self-pay | Admitting: Physical Therapy

## 2020-03-29 ENCOUNTER — Other Ambulatory Visit: Payer: Self-pay

## 2020-03-29 DIAGNOSIS — R262 Difficulty in walking, not elsewhere classified: Secondary | ICD-10-CM

## 2020-03-29 DIAGNOSIS — M6281 Muscle weakness (generalized): Secondary | ICD-10-CM

## 2020-03-29 NOTE — Therapy (Signed)
[place], [place], [address] Phone: [phone]   Fax:  [phone]  Physical Therapy Treatment  Patient Details  Name: Dawn Barnes MRN: [id number] Date of Birth: 12/21/1965 Referring Provider (PT): Phillips Odor   Encounter Date: 03/29/2020   PT End of Session - 03/29/20 1126    Visit Number 4    Number of Visits 8    Date for PT Re-Evaluation 04/15/20    Authorization Type Medicaid [place] A    Authorization - Visit Number 4    Authorization - Number of Visits 27   combined PT/OT/SLP   Progress Note Due on Visit 10    PT Start Time 1125    PT Stop Time 1203    PT Time Calculation (min) 38 min    Activity Tolerance No increased pain;Patient tolerated treatment well    Behavior During Therapy [facility] for tasks assessed/performed           Past Medical History:  Diagnosis Date  . Anxiety   . Constipation 04/30/2013  . Depression   . Diabetes mellitus without complication (HCC)    borderline  . Fibroids, intramural 01/21/2016  . GERD (gastroesophageal reflux disease)   . Heartburn   . Hot flashes 01/11/2016  . Mixed incontinence   . MS (multiple sclerosis) ([place])   . Pancreatitis   . PONV (postoperative nausea and vomiting)   . PTSD (post-traumatic stress disorder)   . Thyroid disease   . Urge urinary incontinence 05/10/2015  . Vaginal bleeding 01/11/2016    Past Surgical History:  Procedure Laterality Date  . BIOPSY  12/07/2015   Procedure: BIOPSY;  Surgeon: Danie Binder, MD;  [facility];  Service: Endoscopy;;  right and left colon biopsies, gastric bx's   . COLONOSCOPY WITH PROPOFOL N/A 12/07/2015   Dr. Oneida Alar: mild proctitis, hyperplastic polyp   . ENDOMETRIAL ABLATION    . ESOPHAGOGASTRODUODENOSCOPY (EGD) WITH PROPOFOL N/A 12/07/2015   Dr. 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   . INCONTINENCE SURGERY    . SAVORY DILATION N/A  12/07/2015   Procedure: SAVORY DILATION;  Surgeon: Danie Binder, MD;  [facility];  Service: Endoscopy;  Laterality: N/A;  . TUBAL LIGATION      There were no vitals filed for this visit.   Subjective Assessment - 03/29/20 1125    Subjective Patient states she is doing good. No falls. She has been able to do her home exercises some.    Pertinent History MS, thyroid issues    How long can you walk comfortably? 10    Patient Stated Goals to be able to squat down to the floor, to have stronger legs and feel her balance is better (dynamic)    Currently in Pain? No/denies                             [facility] Adult PT Treatment/Exercise - 03/29/20 0001      Knee/Hip Exercises: Standing   Forward Lunges 2 sets;10 reps;Both    Side Lunges Both;1 set;10 reps    Hip Abduction Stengthening;Both;1 set;15 reps;Knee straight    Hip Extension Stengthening;Both;1 set;15 reps;Knee straight    Lateral Step Up Both;2 sets;10 reps;Step Height: 6"    Forward Step Up Both;2 sets;10 reps;Step Height: 6"    Functional Squat 2 sets;15 reps    SLS with Vectors 5x  5 second holds bilateral     Other Standing Knee Exercises tandem on foam 2x30 sec each; lateral stepping 4x15 feet; tandem gait 4x15 feet    Other Standing Knee Exercises Pallof press in tandem w/ GTB x20 reps                  PT Education - 03/29/20 1126    Education Details Patient educated on HEP, exercise mechanics    Person(s) Educated Patient    Methods Explanation;Demonstration    Comprehension Verbalized understanding;Returned demonstration            PT Short Term Goals - 03/26/20 1236      PT SHORT TERM GOAL #1   Title Patient will report at least 25% improvement in overall symptoms and/or function to demonstrate improved functional mobility    Time 2    Period Weeks    Status On-going    Target Date 04/01/20      PT SHORT TERM GOAL #2   Title Patient will be independent in self  management strategies to improve quality of life and functional outcomes.    Time 2    Period Weeks    Status On-going    Target Date 04/01/20             PT Long Term Goals - 03/26/20 1236      PT LONG TERM GOAL #1   Title Patient will be able to squat down to floor and stand back up 5 times to demonstrate improved functional endurance    Time 4    Period Weeks    Status On-going      PT LONG TERM GOAL #2   Title Patient will report at least 50% improvement in overall symptoms and/or function to demonstrate improved functional mobility    Time 4    Period Weeks    Status On-going                 Plan - 03/29/20 1126    Clinical Impression Statement Patient able to complete standing hip exercises with min cueing for positioning and mechanics. Patient requires intermittent UE use with stair exercises for balance. Patient demonstrates good squatting mechanics with squatting but requires bilateral UE support. Patient demonstrates good static balance today on foam with minimal intermittent UE support. She requires verbal cueing for lowering with lunges rather than anterior translation. Patient with min/mod sway with SLS exercise requiring intermittent UE support to regain balance with greater difficulty completing on RLE. Patient will continue to benefit from skilled physical therapy in order to reduce impairment and improve function.    Personal Factors and Comorbidities Comorbidity 1;Comorbidity 2;Comorbidity 3+    Comorbidities MS, hx of falls, depression    Examination-Activity Limitations Locomotion Level;Squat    Examination-Participation Restrictions Cleaning;Yard Work    Stability/Clinical Decision Making Stable/Uncomplicated    Rehab Potential Good    PT Frequency 2x / week    PT Duration 4 weeks    PT Treatment/Interventions ADLs/Self Care Home Management;Aquatic Therapy;Balance training;Therapeutic exercise;Therapeutic activities;Functional mobility training;Stair  training;Gait training;Neuromuscular re-education;Patient/family education;Manual techniques    PT Next Visit Plan functional strength- wants to be able to squat all the way from floor and stand up; LE strength, high level balance tasks - develop HEP for pt and print exercises    PT Home Exercise Plan 8/19 STS; 03/22/2020 - SLS, SLS w/ vectors; 03/26/20 - forward step up, stand hip abd/ext.  Patient will benefit from skilled therapeutic intervention in order to improve the following deficits and impairments:  Difficulty walking, Decreased mobility, Decreased balance, Decreased activity tolerance, Decreased strength  Visit Diagnosis: Muscle weakness (generalized)  Difficulty in walking, not elsewhere classified     Problem List Patient Active Problem List   Diagnosis Date Noted  . Screening for colorectal cancer 10/13/2019  . Encounter for gynecological examination with Papanicolaou smear of cervix 10/13/2019  . GERD (gastroesophageal reflux disease) 03/12/2019  . Loss of weight 07/19/2018  . Goiter 03/13/2018  . Pelvic pain in female 05/16/2017  . History of posttraumatic stress disorder (PTSD) 05/15/2016  . Burning with urination 05/15/2016  . Encounter for well woman exam with routine gynecological exam 05/15/2016  . Fibroids, intramural 01/21/2016  . Vaginal bleeding 01/11/2016  . Hot flashes 01/11/2016  . Rectal bleeding 11/03/2015  . Dysphagia 11/03/2015  . Pre-diabetes 10/25/2015  . Vitamin D deficiency 10/25/2015  . Obesity (BMI 30-39.9) 10/25/2015  . Urge urinary incontinence 05/10/2015  . Radicular low back pain 10/23/2013  . Depression 04/30/2013  . Hypothyroidism 04/30/2013  . Constipation 04/30/2013    12:07 PM, 03/29/20 Mearl Latin PT, DPT Physical Therapist at [place], [place], [address] Phone: [phone]   Fax:  [phone]  Name:  ELLANIE OPPEDISANO MRN: [id number] Date of Birth: 17-Nov-1965

## 2020-04-02 ENCOUNTER — Encounter (HOSPITAL_COMMUNITY): Payer: Self-pay | Admitting: Physical Therapy

## 2020-04-02 ENCOUNTER — Other Ambulatory Visit: Payer: Self-pay

## 2020-04-02 ENCOUNTER — Ambulatory Visit (HOSPITAL_COMMUNITY): Payer: Medicaid Other | Attending: Neurology | Admitting: Physical Therapy

## 2020-04-02 DIAGNOSIS — R262 Difficulty in walking, not elsewhere classified: Secondary | ICD-10-CM | POA: Insufficient documentation

## 2020-04-02 DIAGNOSIS — M6281 Muscle weakness (generalized): Secondary | ICD-10-CM | POA: Diagnosis present

## 2020-04-02 NOTE — Therapy (Addendum)
[place], [place], [address] Phone: [phone]   Fax:  [phone]  Physical Therapy Treatment  Patient Details  Name: GINEVRA TACKER MRN: [id number] Date of Birth: 1966/05/30 Referring Provider (PT): Phillips Odor   Encounter Date: 04/02/2020   PT End of Session - 04/02/20 0931    Visit Number 5    Number of Visits 8    Date for PT Re-Evaluation 04/15/20    Authorization Type Medicaid [place] A    Authorization - Visit Number 5   Authorization - Number of Visits 27   combined PT/OT/SLP   Progress Note Due on Visit 10    PT Start Time [phone]   late to session   PT Stop Time 0955    PT Time Calculation (min) 27 min    Activity Tolerance No increased pain;Patient tolerated treatment well    Behavior During Therapy [facility] for tasks assessed/performed           Past Medical History:  Diagnosis Date  . Anxiety   . Constipation 04/30/2013  . Depression   . Diabetes mellitus without complication (HCC)    borderline  . Fibroids, intramural 01/21/2016  . GERD (gastroesophageal reflux disease)   . Heartburn   . Hot flashes 01/11/2016  . Mixed incontinence   . MS (multiple sclerosis) ([place])   . Pancreatitis   . PONV (postoperative nausea and vomiting)   . PTSD (post-traumatic stress disorder)   . Thyroid disease   . Urge urinary incontinence 05/10/2015  . Vaginal bleeding 01/11/2016    Past Surgical History:  Procedure Laterality Date  . BIOPSY  12/07/2015   Procedure: BIOPSY;  Surgeon: Danie Binder, MD;  [facility];  Service: Endoscopy;;  right and left colon biopsies, gastric bx's   . COLONOSCOPY WITH PROPOFOL N/A 12/07/2015   Dr. Oneida Alar: mild proctitis, hyperplastic polyp   . ENDOMETRIAL ABLATION    . ESOPHAGOGASTRODUODENOSCOPY (EGD) WITH PROPOFOL N/A 12/07/2015   Dr. 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   . INCONTINENCE SURGERY    .  SAVORY DILATION N/A 12/07/2015   Procedure: SAVORY DILATION;  Surgeon: Danie Binder, MD;  [facility];  Service: Endoscopy;  Laterality: N/A;  . TUBAL LIGATION      There were no vitals filed for this visit.   Subjective Assessment - 04/02/20 0951    Subjective Reports she has been sore but thinks it's from all the exercises. States she has been doing her exercises and feels like she is getting stronger.    Pertinent History MS, thyroid issues    How long can you walk comfortably? 10    Patient Stated Goals to be able to squat down to the floor, to have stronger legs and feel her balance is better (dynamic)    Currently in Pain? Yes    Pain Score 4     Pain Location Leg    Pain Orientation Right    Pain Descriptors / Indicators Aching              [facility] PT Assessment - 04/02/20 0001      Assessment   Medical Diagnosis gait issues    Referring Provider (PT) Phillips Odor                         [facility] Adult PT Treatment/Exercise - 04/02/20 0001  Knee/Hip Exercises: Standing   Other Standing Knee Exercises chair pose 3x5 5"" holds with UE assist in // bars; sumo squat box lift - cues to increase hip flexion (butt lower) 4x5     Other Standing Knee Exercises lateral stepping 6" step 3x10 B with 1 UE support                     PT Short Term Goals - 03/26/20 1236      PT SHORT TERM GOAL #1   Title Patient will report at least 25% improvement in overall symptoms and/or function to demonstrate improved functional mobility    Time 2    Period Weeks    Status On-going    Target Date 04/01/20      PT SHORT TERM GOAL #2   Title Patient will be independent in self management strategies to improve quality of life and functional outcomes.    Time 2    Period Weeks    Status On-going    Target Date 04/01/20             PT Long Term Goals - 03/26/20 1236      PT LONG TERM GOAL #1   Title Patient will be able to squat down to floor  and stand back up 5 times to demonstrate improved functional endurance    Time 4    Period Weeks    Status On-going      PT LONG TERM GOAL #2   Title Patient will report at least 50% improvement in overall symptoms and/or function to demonstrate improved functional mobility    Time 4    Period Weeks    Status On-going                 Plan - 04/02/20 3419    Clinical Impression Statement Session limited secondary to patient's late arrival. Added deeper squats with chair pose and this was tolerated moderately well. No increase in pain just fatigue noted with exercises. Cues for form with chair pose as she favors right leg. Will continue with LE strengthening and working towards deeper squats to achieve functional goals.    Personal Factors and Comorbidities Comorbidity 1;Comorbidity 2;Comorbidity 3+    Comorbidities MS, hx of falls, depression    Examination-Activity Limitations Locomotion Level;Squat    Examination-Participation Restrictions Cleaning;Yard Work    Stability/Clinical Decision Making Stable/Uncomplicated    Rehab Potential Good    PT Frequency 2x / week    PT Duration 4 weeks    PT Treatment/Interventions ADLs/Self Care Home Management;Aquatic Therapy;Balance training;Therapeutic exercise;Therapeutic activities;Functional mobility training;Stair training;Gait training;Neuromuscular re-education;Patient/family education;Manual techniques    PT Next Visit Plan functional strength- wants to be able to squat all the way from floor and stand up; LE strength, high level balance tasks - develop HEP for pt and print exercises    PT Home Exercise Plan 8/19 STS; 03/22/2020 - SLS, SLS w/ vectors; 03/26/20 - forward step up, stand hip abd/ext.           Patient will benefit from skilled therapeutic intervention in order to improve the following deficits and impairments:  Difficulty walking, Decreased mobility, Decreased balance, Decreased activity tolerance, Decreased  strength  Visit Diagnosis: Muscle weakness (generalized)  Difficulty in walking, not elsewhere classified     Problem List Patient Active Problem List   Diagnosis Date Noted  . Screening for colorectal cancer 10/13/2019  . Encounter for gynecological examination with Papanicolaou smear of cervix 10/13/2019  .  GERD (gastroesophageal reflux disease) 03/12/2019  . Loss of weight 07/19/2018  . Goiter 03/13/2018  . Pelvic pain in female 05/16/2017  . History of posttraumatic stress disorder (PTSD) 05/15/2016  . Burning with urination 05/15/2016  . Encounter for well woman exam with routine gynecological exam 05/15/2016  . Fibroids, intramural 01/21/2016  . Vaginal bleeding 01/11/2016  . Hot flashes 01/11/2016  . Rectal bleeding 11/03/2015  . Dysphagia 11/03/2015  . Pre-diabetes 10/25/2015  . Vitamin D deficiency 10/25/2015  . Obesity (BMI 30-39.9) 10/25/2015  . Urge urinary incontinence 05/10/2015  . Radicular low back pain 10/23/2013  . Depression 04/30/2013  . Hypothyroidism 04/30/2013  . Constipation 04/30/2013   9:58 AM, 04/02/20 Jerene Pitch, DPT Physical Therapy with [facility]  [phone] office  [place] [address] [place], [place], [address] Phone: [phone]   Fax:  [phone]  Name: SANYIAH KANZLER MRN: [id number] Date of Birth: 18-Dec-1965

## 2020-04-07 ENCOUNTER — Encounter (HOSPITAL_COMMUNITY): Payer: Self-pay | Admitting: Physical Therapy

## 2020-04-07 ENCOUNTER — Ambulatory Visit (HOSPITAL_COMMUNITY): Payer: Medicaid Other | Admitting: Physical Therapy

## 2020-04-07 ENCOUNTER — Other Ambulatory Visit: Payer: Self-pay

## 2020-04-07 DIAGNOSIS — R262 Difficulty in walking, not elsewhere classified: Secondary | ICD-10-CM

## 2020-04-07 DIAGNOSIS — M6281 Muscle weakness (generalized): Secondary | ICD-10-CM | POA: Diagnosis not present

## 2020-04-07 NOTE — Therapy (Signed)
[facility] [place], [place], [address] Phone: [phone]   Fax:  [phone]  Physical Therapy Treatment  Patient Details  Name: Dawn Barnes MRN: [id number] Date of Birth: [birth year] Referring Provider (PT): Phillips Odor   Encounter Date: 04/07/2020   PT End of Session - 04/07/20 0950    Visit Number 6    Number of Visits 8    Date for PT Re-Evaluation 04/15/20    Authorization Type Medicaid  A    Authorization - Visit Number 6    Authorization - Number of Visits 27   combined PT/OT/SLP   Progress Note Due on Visit 10    PT Start Time 0950    PT Stop Time 1028    PT Time Calculation (min) 38 min    Activity Tolerance No increased pain;Patient tolerated treatment well    Behavior During Therapy [facility] for tasks assessed/performed           Past Medical History:  Diagnosis Date  . Anxiety   . Constipation 04/30/2013  . Depression   . Diabetes mellitus without complication (HCC)    borderline  . Fibroids, intramural 01/21/2016  . GERD (gastroesophageal reflux disease)   . Heartburn   . Hot flashes 01/11/2016  . Mixed incontinence   . MS (multiple sclerosis) ([place])   . Pancreatitis   . PONV (postoperative nausea and vomiting)   . PTSD (post-traumatic stress disorder)   . Thyroid disease   . Urge urinary incontinence 05/10/2015  . Vaginal bleeding 01/11/2016    Past Surgical History:  Procedure Laterality Date  . BIOPSY  12/07/2015   Procedure: BIOPSY;  Surgeon: Danie Binder, MD;  [facility];  Service: Endoscopy;;  right and left colon biopsies, gastric bx's   . COLONOSCOPY WITH PROPOFOL N/A 12/07/2015   Dr. Oneida Alar: mild proctitis, hyperplastic polyp   . ENDOMETRIAL ABLATION    . ESOPHAGOGASTRODUODENOSCOPY (EGD) WITH PROPOFOL N/A 12/07/2015   Dr. 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   . INCONTINENCE SURGERY    . SAVORY DILATION N/A  12/07/2015   Procedure: SAVORY DILATION;  Surgeon: Danie Binder, MD;  [facility];  Service: Endoscopy;  Laterality: N/A;  . TUBAL LIGATION      There were no vitals filed for this visit.   Subjective Assessment - 04/07/20 0949    Subjective Patient states she has been feeling pretty good. Her feet legs were hurting yesterday. Her home exercises are going well at home.    Pertinent History MS, thyroid issues    How long can you walk comfortably? 10    Patient Stated Goals to be able to squat down to the floor, to have stronger legs and feel her balance is better (dynamic)    Currently in Pain? No/denies                             [facility] Adult PT Treatment/Exercise - 04/07/20 0001      Knee/Hip Exercises: Standing   Lateral Step Up Both;2 sets;10 reps;Step Height: 6"    Lateral Step Up Limitations eccentric control    Forward Step Up Both;2 sets;10 reps;Step Height: 6"    Forward Step Up Limitations 2nd set with contralteral knee drive    Functional Squat 3 sets;10 reps    Functional Squat Limitations to 18 inch box    SLS with  Vectors 5x 5 second holds bilateral     Other Standing Knee Exercises sumo squat box lift - cues to increase hip flexion (butt lower) 4x5     Other Standing Knee Exercises Pallof press in tandem w/ GTB x20 reps                  PT Education - 04/07/20 0949    Education Details Patient educated on HEP, exercise mechanics    Person(s) Educated Patient    Methods Explanation;Demonstration    Comprehension Verbalized understanding;Returned demonstration            PT Short Term Goals - 03/26/20 1236      PT SHORT TERM GOAL #1   Title Patient will report at least 25% improvement in overall symptoms and/or function to demonstrate improved functional mobility    Time 2    Period Weeks    Status On-going    Target Date 04/01/20      PT SHORT TERM GOAL #2   Title Patient will be independent in self management  strategies to improve quality of life and functional outcomes.    Time 2    Period Weeks    Status On-going    Target Date 04/01/20             PT Long Term Goals - 03/26/20 1236      PT LONG TERM GOAL #1   Title Patient will be able to squat down to floor and stand back up 5 times to demonstrate improved functional endurance    Time 4    Period Weeks    Status On-going      PT LONG TERM GOAL #2   Title Patient will report at least 50% improvement in overall symptoms and/or function to demonstrate improved functional mobility    Time 4    Period Weeks    Status On-going                 Plan - 04/07/20 0950    Clinical Impression Statement Patient requires unilateral UE support with stair exercises for assist with balance. Patient requires verbal cueing and prior demonstration for lateral step down and cueing for eccentric control. She shows more difficulty completing on RLE>LLE secondary to impaired strength with lateral step down. Patient requires verbal cueing for squat mechanics with sumo squat box lifts for increased hip flexion rather than bending forward. Patient completes squats with butt taps to 18 inch box today and requires minimal use of hands for concentric phase which increases with fatigue. Patient notes fatigue at end of session. Patient will continue to benefit from skilled physical therapy in order to reduce impairment and improve function.    Personal Factors and Comorbidities Comorbidity 1;Comorbidity 2;Comorbidity 3+    Comorbidities MS, hx of falls, depression    Examination-Activity Limitations Locomotion Level;Squat    Examination-Participation Restrictions Cleaning;Yard Work    Stability/Clinical Decision Making Stable/Uncomplicated    Rehab Potential Good    PT Frequency 2x / week    PT Duration 4 weeks    PT Treatment/Interventions ADLs/Self Care Home Management;Aquatic Therapy;Balance training;Therapeutic exercise;Therapeutic  activities;Functional mobility training;Stair training;Gait training;Neuromuscular re-education;Patient/family education;Manual techniques    PT Next Visit Plan functional strength- wants to be able to squat all the way from floor and stand up; LE strength, high level balance tasks - develop HEP for pt and print exercises    PT Home Exercise Plan 8/19 STS; 03/22/2020 - SLS, SLS w/ vectors; 03/26/20 -  forward step up, stand hip abd/ext. 9/8 squat at counter           Patient will benefit from skilled therapeutic intervention in order to improve the following deficits and impairments:  Difficulty walking, Decreased mobility, Decreased balance, Decreased activity tolerance, Decreased strength  Visit Diagnosis: Muscle weakness (generalized)  Difficulty in walking, not elsewhere classified     Problem List Patient Active Problem List   Diagnosis Date Noted  . Screening for colorectal cancer 10/13/2019  . Encounter for gynecological examination with Papanicolaou smear of cervix 10/13/2019  . GERD (gastroesophageal reflux disease) 03/12/2019  . Loss of weight 07/19/2018  . Goiter 03/13/2018  . Pelvic pain in female 05/16/2017  . History of posttraumatic stress disorder (PTSD) 05/15/2016  . Burning with urination 05/15/2016  . Encounter for well woman exam with routine gynecological exam 05/15/2016  . Fibroids, intramural 01/21/2016  . Vaginal bleeding 01/11/2016  . Hot flashes 01/11/2016  . Rectal bleeding 11/03/2015  . Dysphagia 11/03/2015  . Pre-diabetes 10/25/2015  . Vitamin D deficiency 10/25/2015  . Obesity (BMI 30-39.9) 10/25/2015  . Urge urinary incontinence 05/10/2015  . Radicular low back pain 10/23/2013  . Depression 04/30/2013  . Hypothyroidism 04/30/2013  . Constipation 04/30/2013    10:30 AM, 04/07/20 Mearl Latin PT, DPT Physical Therapist at [place], [place], [address] Phone: [phone]   Fax:  [phone]  Name: Dawn Barnes MRN: [id number] Date of Birth: [birth year]

## 2020-04-07 NOTE — Patient Instructions (Signed)
Access Code: [email or web address] URL: [email or web address] Date: 04/07/2020 Prepared by: Mitzi Hansen Fany Cavanaugh  Exercises Squat with Counter Support - 1 x daily - 7 x weekly - 3 sets - 10 reps

## 2020-04-08 ENCOUNTER — Encounter (HOSPITAL_COMMUNITY): Payer: Self-pay

## 2020-04-08 ENCOUNTER — Ambulatory Visit (HOSPITAL_COMMUNITY): Payer: Medicaid Other

## 2020-04-08 DIAGNOSIS — R262 Difficulty in walking, not elsewhere classified: Secondary | ICD-10-CM

## 2020-04-08 DIAGNOSIS — M6281 Muscle weakness (generalized): Secondary | ICD-10-CM

## 2020-04-08 NOTE — Therapy (Signed)
[facility] [place], [place], [address] Phone: [phone]   Fax:  [phone]  Physical Therapy Treatment  Patient Details  Name: Dawn Barnes MRN: [id number] Date of Birth: August 10, 1965 Referring Provider (PT): Phillips Odor   Encounter Date: 04/08/2020   PT End of Session - 04/08/20 1014    Visit Number 7    Number of Visits 8    Date for PT Re-Evaluation 04/15/20    Authorization Type Medicaid [place] A    Authorization - Visit Number 7    Authorization - Number of Visits 27   combination PT/OT/SLP   Progress Note Due on Visit 10    PT Start Time 1006    PT Stop Time 1045    PT Time Calculation (min) 39 min    Activity Tolerance No increased pain;Patient tolerated treatment well;Patient limited by fatigue   fatigue level range from 1-4/10   Behavior During Therapy [facility] for tasks assessed/performed           Past Medical History:  Diagnosis Date  . Anxiety   . Constipation 04/30/2013  . Depression   . Diabetes mellitus without complication (HCC)    borderline  . Fibroids, intramural 01/21/2016  . GERD (gastroesophageal reflux disease)   . Heartburn   . Hot flashes 01/11/2016  . Mixed incontinence   . MS (multiple sclerosis) ([place])   . Pancreatitis   . PONV (postoperative nausea and vomiting)   . PTSD (post-traumatic stress disorder)   . Thyroid disease   . Urge urinary incontinence 05/10/2015  . Vaginal bleeding 01/11/2016    Past Surgical History:  Procedure Laterality Date  . BIOPSY  12/07/2015   Procedure: BIOPSY;  Surgeon: Danie Binder, MD;  [facility];  Service: Endoscopy;;  right and left colon biopsies, gastric bx's   . COLONOSCOPY WITH PROPOFOL N/A 12/07/2015   Dr. Oneida Alar: mild proctitis, hyperplastic polyp   . ENDOMETRIAL ABLATION    . ESOPHAGOGASTRODUODENOSCOPY (EGD) WITH PROPOFOL N/A 12/07/2015   Dr. 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   . INCONTINENCE SURGERY    . SAVORY DILATION N/A 12/07/2015   Procedure: SAVORY DILATION;  Surgeon: Danie Binder, MD;  [facility];  Service: Endoscopy;  Laterality: N/A;  . TUBAL LIGATION      There were no vitals filed for this visit.   Subjective Assessment - 04/08/20 1010    Subjective Pt reports she is tired this morning, does reports some numbness down Rt LE on lateral aspect.  Arrived wearing crocs, reports she needs to purchase new pair of shoes.  Current fatigue 1/10 currently.  Reports about a week ago she feel off loveseat and bruised her Rt shoulder.    Pertinent History MS, thyroid issues    Patient Stated Goals to be able to squat down to the floor, to have stronger legs and feel her balance is better (dynamic)    Currently in Pain? Yes    Pain Score 1     Pain Location Leg    Pain Orientation Right    Pain Descriptors / Indicators Numbness;Burning;Aching    Pain Onset More than a month ago    Pain Frequency Intermittent    Aggravating Factors  standing, walking    Pain Relieving Factors relaxing, laying/sitting down    Effect of Pain on Daily Activities limits  [place] Adult PT Treatment/Exercise - 04/08/20 0001      Knee/Hip Exercises: Standing   Forward Lunges 2 sets;5 reps    Forward Lunges Limitations posterior lunge with foam under back knee, did not go down to kneeling    Side Lunges Both;1 set;10 reps    Lateral Step Up Both;Hand Hold: 0;Hand Hold: 1;Step Height: 6";10 reps    Lateral Step Up Limitations eccentric control    Functional Squat 10 reps;2 sets    Functional Squat Limitations to 18 inch box    SLS with Vectors 5x 5 second holds bilateral LE hands hovering over bars with minimal interve    Other Standing Knee Exercises sumo squat box lift - cues to increase hip flexion (butt lower) 4x5     Other Standing Knee Exercises sidestep in minisquat position; crossover step up       Knee/Hip Exercises: Prone   Other Prone Exercises Quadruped to half kneeling 5x;    Other Prone Exercises  half kneeling to standing with 1HHA 1rep each                    PT Short Term Goals - 03/26/20 1236      PT SHORT TERM GOAL #1   Title Patient will report at least 25% improvement in overall symptoms and/or function to demonstrate improved functional mobility    Time 2    Period Weeks    Status On-going    Target Date 04/01/20      PT SHORT TERM GOAL #2   Title Patient will be independent in self management strategies to improve quality of life and functional outcomes.    Time 2    Period Weeks    Status On-going    Target Date 04/01/20             PT Long Term Goals - 03/26/20 1236      PT LONG TERM GOAL #1   Title Patient will be able to squat down to floor and stand back up 5 times to demonstrate improved functional endurance    Time 4    Period Weeks    Status On-going      PT LONG TERM GOAL #2   Title Patient will report at least 50% improvement in overall symptoms and/or function to demonstrate improved functional mobility    Time 4    Period Weeks    Status On-going                 Plan - 04/08/20 1309    Clinical Impression Statement Session focus on functional strengthening.  Added posterior lunges and quadruped transition exercises to progress pt.'s goal wiht ability to transfer floor to standing.  Pt required HHA to assist with new activities as well as during stair training exercises to assist with balance.  Fatigue levels range from 1-4/10 through session, periodic standing rest breaks given for fatigue control.  No reports of increased pain.    Personal Factors and Comorbidities Comorbidity 1;Comorbidity 2;Comorbidity 3+    Comorbidities MS, hx of falls, depression    Examination-Activity Limitations Locomotion Level;Squat    Examination-Participation Restrictions Cleaning;Yard Work    Stability/Clinical Decision Making  Stable/Uncomplicated    [employer]er, [employer]ell[employer]    Rehab Potential Good    PT Frequency 2x / week    PT Duration 4 weeks    PT Treatment/Interventions ADLs/Self Care Home Management;Aquatic Therapy;Balance training;Therapeutic exercise;Therapeutic activities;Functional mobility training;Stair training;Gait training;Neuromuscular re-education;Patient/family education;Manual techniques  PT Next Visit Plan functional strength- wants to be able to squat all the way from floor and stand up; LE strength, high level balance tasks - develop HEP for pt and print exercises    PT Home Exercise Plan 8/19 STS; 03/22/2020 - SLS, SLS w/ vectors; 03/26/20 - forward step up, stand hip abd/ext. 9/8 squat at counter           Patient will benefit from skilled therapeutic intervention in order to improve the following deficits and impairments:  Difficulty walking, Decreased mobility, Decreased balance, Decreased activity tolerance, Decreased strength  Visit Diagnosis: Difficulty in walking, not elsewhere classified  Muscle weakness (generalized)     Problem List Patient Active Problem List   Diagnosis Date Noted  . Screening for colorectal cancer 10/13/2019  . Encounter for gynecological examination with Papanicolaou smear of cervix 10/13/2019  . GERD (gastroesophageal reflux disease) 03/12/2019  . Loss of weight 07/19/2018  . Goiter 03/13/2018  . Pelvic pain in female 05/16/2017  . History of posttraumatic stress disorder (PTSD) 05/15/2016  . Burning with urination 05/15/2016  . Encounter for well woman exam with routine gynecological exam 05/15/2016  . Fibroids, intramural 01/21/2016  . Vaginal bleeding 01/11/2016  . Hot flashes 01/11/2016  . Rectal bleeding 11/03/2015  . Dysphagia 11/03/2015  . Pre-diabetes 10/25/2015  . Vitamin D deficiency 10/25/2015  . Obesity (BMI 30-39.9) 10/25/2015  . Urge urinary incontinence 05/10/2015  . Radicular low back pain 10/23/2013  . Depression  04/30/2013  . Hypothyroidism 04/30/2013  . Constipation 04/30/2013   Ihor Austin, LPTA/CLT; CBIS [phone]  Aldona Lento 04/08/2020, 2:27 PM  [facility] [address] [place], [place], [address] Phone: [phone]   Fax:  [phone]  Name: Dawn Barnes MRN: [id number] Date of Birth: 20-May-1966

## 2020-04-12 ENCOUNTER — Encounter (HOSPITAL_COMMUNITY): Payer: Self-pay | Admitting: Physical Therapy

## 2020-04-12 ENCOUNTER — Other Ambulatory Visit: Payer: Self-pay

## 2020-04-12 ENCOUNTER — Ambulatory Visit (HOSPITAL_COMMUNITY): Payer: Medicaid Other | Admitting: Physical Therapy

## 2020-04-12 ENCOUNTER — Other Ambulatory Visit: Payer: Self-pay | Admitting: Nurse Practitioner

## 2020-04-12 DIAGNOSIS — R262 Difficulty in walking, not elsewhere classified: Secondary | ICD-10-CM

## 2020-04-12 DIAGNOSIS — M6281 Muscle weakness (generalized): Secondary | ICD-10-CM

## 2020-04-12 NOTE — Therapy (Signed)
[facility] [place], [place], [address] Phone: [phone]   Fax:  [phone]  Physical Therapy Treatment/ Discharge Summary  Patient Details  Name: Dawn Barnes MRN: [id number] Date of Birth: May 09, 1966 Referring Provider (PT): Phillips Odor   Encounter Date: 04/12/2020  PHYSICAL THERAPY DISCHARGE SUMMARY  Visits from Start of Care: 8  Current functional level related to goals / functional outcomes: See below   Remaining deficits: See below   Education / Equipment: See below  Plan: Patient agrees to discharge.  Patient goals were met. Patient is being discharged due to being pleased with the current functional level.  ?????        PT End of Session - 04/12/20 1122    Visit Number 8    Number of Visits 8    Date for PT Re-Evaluation 04/15/20    Authorization Type Medicaid [place] A    Authorization - Visit Number 8    Authorization - Number of Visits 27   combination PT/OT/SLP   Progress Note Due on Visit 10    PT Start Time 1122    PT Stop Time 1200    PT Time Calculation (min) 38 min    Activity Tolerance No increased pain;Patient tolerated treatment well;Patient limited by fatigue   fatigue level range from 1-4/10   Behavior During Therapy [facility] for tasks assessed/performed           Past Medical History:  Diagnosis Date  . Anxiety   . Constipation 04/30/2013  . Depression   . Diabetes mellitus without complication (HCC)    borderline  . Fibroids, intramural 01/21/2016  . GERD (gastroesophageal reflux disease)   . Heartburn   . Hot flashes 01/11/2016  . Mixed incontinence   . MS (multiple sclerosis) ([place])   . Pancreatitis   . PONV (postoperative nausea and vomiting)   . PTSD (post-traumatic stress disorder)   . Thyroid disease   . Urge urinary incontinence 05/10/2015  . Vaginal bleeding 01/11/2016    Past Surgical History:  Procedure Laterality Date  . BIOPSY  12/07/2015   Procedure:  BIOPSY;  Surgeon: Danie Binder, MD;  [facility];  Service: Endoscopy;;  right and left colon biopsies, gastric bx's   . COLONOSCOPY WITH PROPOFOL N/A 12/07/2015   Dr. [facility] Alar: mild proctitis, hyperplastic polyp   . ENDOMETRIAL ABLATION    . ESOPHAGOGASTRODUODENOSCOPY (EGD) WITH PROPOFOL N/A 12/07/2015   Dr. 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   . INCONTINENCE SURGERY    . SAVORY DILATION N/A 12/07/2015   Procedure: SAVORY DILATION;  Surgeon: Danie Binder, MD;  [facility];  Service: Endoscopy;  Laterality: N/A;  . TUBAL LIGATION      There were no vitals filed for this visit.   Subjective Assessment - 04/12/20 1122    Subjective Patient states 40% improvement with physical therapy intervention. She continues to feel limited with leg strength and carpel tunnel. She has been able to do her exercises at home a little bit but not as much as she should. She has not had to get on/off the floor much.    Pertinent History MS, thyroid issues    Patient Stated Goals to be able to squat down to the floor, to have stronger legs and feel her balance is better (dynamic)    Currently in Pain? Yes    Pain Score 2     Pain Location Leg  Pain Onset More than a month ago              [facility] PT Assessment - 04/12/20 0001      Assessment   Medical Diagnosis gait issues    Referring Provider (PT) Kofi [place]      Precautions   Precautions Fall      Balance Screen   Has the patient fallen in the past 6 months Yes    How many times? 1    Has the patient had a decrease in activity level because of a fear of falling?  No    Is the patient reluctant to leave their home because of a fear of falling?  No      Home Environment   Living Environment Private residence    Living Arrangements Children    Available Help at Discharge Family    Type of [place] to enter    Entrance Stairs-Number of  Steps 4    Entrance Stairs-Rails Right    [place] One level    [place] None      Cognition   Overall Cognitive Status Within Functional Limits for tasks assessed      Observation/Other Assessments   Focus on Therapeutic Outcomes (FOTO)  NA                         [facility] Adult PT Treatment/Exercise - 04/12/20 0001      Knee/Hip Exercises: Standing   Forward Lunges 2 sets    Forward Lunges Limitations 8 reps     Side Lunges Both;1 set;10 reps    Functional Squat 10 reps;3 sets    Functional Squat Limitations to 18 inch box    Other Standing Knee Exercises squat to floor x 5 sumo squat box lift 4x5    Other Standing Knee Exercises sidestep in minisquat position4x 15                  PT Education - 04/12/20 1122    Education Details Patient educated on HEP, exercise mechanics, re assessment findings, POC    Person(s) Educated Patient    Methods Explanation;Demonstration    Comprehension Verbalized understanding;Returned demonstration            PT Short Term Goals - 04/12/20 1128      PT SHORT TERM GOAL #1   Title Patient will report at least 25% improvement in overall symptoms and/or function to demonstrate improved functional mobility    Time 2    Period Weeks    Status Achieved    Target Date 04/01/20      PT SHORT TERM GOAL #2   Title Patient will be independent in self management strategies to improve quality of life and functional outcomes.    Time 2    Period Weeks    Status Achieved    Target Date 04/01/20             PT Long Term Goals - 04/12/20 1133      PT LONG TERM GOAL #1   Title Patient will be able to squat down to floor and stand back up 5 times to demonstrate improved functional endurance    Time 4    Period Weeks    Status Achieved      PT LONG TERM GOAL #2   Title Patient will report at least 50% improvement in overall symptoms and/or function to  demonstrate improved functional mobility    Time 4     Period Weeks    Status On-going                 Plan - 04/12/20 1123    Clinical Impression Statement Patient has met 2/2 short term goals with ability to complete HEP and improvement in symptoms. Patient has met 1/2 long term goals with ability to squat to floor. Remaining goal not met due to patient stating 40% improvement at this time. Patient wishes to continue exercises at home instead of extending POC. Patient continues to remain limited by LE strength and endurance. Patient requires verbal cueing and demonstration for forward and lateral lunge mechanics. Patient able to squat to floor with good mechanics today x 5 meeting her goal. Patient requires cueing for motor control with sumo squat. Patient discharged from physical therapy at this time.    Personal Factors and Comorbidities Comorbidity 1;Comorbidity 2;Comorbidity 3+    Comorbidities MS, hx of falls, depression    Examination-Activity Limitations Locomotion Level;Squat    Examination-Participation Restrictions Cleaning;Yard Work    Stability/Clinical Decision Making Stable/Uncomplicated    Rehab Potential Good    PT Frequency --    PT Duration --    PT Treatment/Interventions ADLs/Self Care Home Management;Aquatic Therapy;Balance training;Therapeutic exercise;Therapeutic activities;Functional mobility training;Stair training;Gait training;Neuromuscular re-education;Patient/family education;Manual techniques    PT Next Visit Plan functional strength- wants to be able to squat all the way from floor and stand up; LE strength, high level balance tasks - develop HEP for pt and print exercises    PT Home Exercise Plan 8/19 STS; 03/22/2020 - SLS, SLS w/ vectors; 03/26/20 - forward step up, stand hip abd/ext. 9/8 squat at counter 9/13 Squat, sumo squat, lunge lateral stepping           Patient will benefit from skilled therapeutic intervention in order to improve the following deficits and impairments:  Difficulty walking, Decreased  mobility, Decreased balance, Decreased activity tolerance, Decreased strength  Visit Diagnosis: Difficulty in walking, not elsewhere classified  Muscle weakness (generalized)     Problem List Patient Active Problem List   Diagnosis Date Noted  . Screening for colorectal cancer 10/13/2019  . Encounter for gynecological examination with Papanicolaou smear of cervix 10/13/2019  . GERD (gastroesophageal reflux disease) 03/12/2019  . Loss of weight 07/19/2018  . Goiter 03/13/2018  . Pelvic pain in female 05/16/2017  . History of posttraumatic stress disorder (PTSD) 05/15/2016  . Burning with urination 05/15/2016  . Encounter for well woman exam with routine gynecological exam 05/15/2016  . Fibroids, intramural 01/21/2016  . Vaginal bleeding 01/11/2016  . Hot flashes 01/11/2016  . Rectal bleeding 11/03/2015  . Dysphagia 11/03/2015  . Pre-diabetes 10/25/2015  . Vitamin D deficiency 10/25/2015  . Obesity (BMI 30-39.9) 10/25/2015  . Urge urinary incontinence 05/10/2015  . Radicular low back pain 10/23/2013  . Depression 04/30/2013  . Hypothyroidism 04/30/2013  . Constipation 04/30/2013   12:03 PM, 04/12/20 Mearl Latin PT, DPT Physical Therapist at [facility] [place], [place], [address] Phone: [phone]   Fax:  [phone]  Name: Dawn Barnes MRN: [id number] Date of Birth: 25-Jan-1966

## 2020-04-12 NOTE — Patient Instructions (Signed)
Access Code: [email or web address] URL: [email or web address] Date: 04/12/2020 Prepared by: Mitzi Hansen Rennae Ferraiolo  Exercises Squat - 1 x daily - 7 x weekly - 2 sets - 10 reps Lunge with Counter Support - 1 x daily - 7 x weekly - 2 sets - 10 reps Sidestepping - 1 x daily - 7 x weekly - 2 sets - 10 reps Sumo Squat with Dumbbell - 1 x daily - 7 x weekly - 4 sets - 5 reps

## 2020-04-14 ENCOUNTER — Encounter (HOSPITAL_COMMUNITY): Payer: Medicaid Other | Admitting: Physical Therapy

## 2020-05-11 ENCOUNTER — Other Ambulatory Visit: Payer: Self-pay | Admitting: "Endocrinology

## 2020-05-19 LAB — VITAMIN D 25 HYDROXY (VIT D DEFICIENCY, FRACTURES): Vit D, 25-Hydroxy: 22 ng/mL — ABNORMAL LOW (ref 30–100)

## 2020-05-19 LAB — T4, FREE: Free T4: 1.3 ng/dL (ref 0.8–1.8)

## 2020-05-19 LAB — TSH: TSH: 0.8 mIU/L

## 2020-05-27 ENCOUNTER — Ambulatory Visit (INDEPENDENT_AMBULATORY_CARE_PROVIDER_SITE_OTHER): Payer: Medicaid Other | Admitting: Nurse Practitioner

## 2020-05-27 ENCOUNTER — Other Ambulatory Visit: Payer: Self-pay

## 2020-05-27 ENCOUNTER — Encounter: Payer: Self-pay | Admitting: Nurse Practitioner

## 2020-05-27 VITALS — BP 135/89 | HR 76 | Ht 64.0 in | Wt 208.6 lb

## 2020-05-27 DIAGNOSIS — E559 Vitamin D deficiency, unspecified: Secondary | ICD-10-CM | POA: Diagnosis not present

## 2020-05-27 DIAGNOSIS — E038 Other specified hypothyroidism: Secondary | ICD-10-CM

## 2020-05-27 DIAGNOSIS — R7303 Prediabetes: Secondary | ICD-10-CM

## 2020-05-27 LAB — POCT GLYCOSYLATED HEMOGLOBIN (HGB A1C): Hemoglobin A1C: 5.6 % (ref 4.0–5.6)

## 2020-05-27 MED ORDER — LEVOTHYROXINE SODIUM 88 MCG PO TABS: 88.0000 ug | ORAL_TABLET | Freq: Every day | ORAL | 3 refills | Status: DC

## 2020-05-27 NOTE — Patient Instructions (Signed)

## 2020-05-27 NOTE — Progress Notes (Signed)
05/27/2020  Endocrinology follow-up note    Subjective:    Patient ID: Dawn Barnes, female    DOB: April 24, 1966, PCP Rosita Fire, MD   Past Medical History:  Diagnosis Date   Anxiety    Constipation 04/30/2013   Depression    Diabetes mellitus without complication ([place])    borderline   Fibroids, intramural 01/21/2016   GERD (gastroesophageal reflux disease)    Heartburn    Hot flashes 01/11/2016   Mixed incontinence    MS (multiple sclerosis) (HCC)    Pancreatitis    PONV (postoperative nausea and vomiting)    PTSD (post-traumatic stress disorder)    Thyroid disease    Urge urinary incontinence 05/10/2015   Vaginal bleeding 01/11/2016   Past Surgical History:  Procedure Laterality Date   BIOPSY  12/07/2015   Procedure: BIOPSY;  Surgeon: Danie Binder, MD;  [facility];  Service: Endoscopy;;  right and left colon biopsies, gastric bx's    COLONOSCOPY WITH PROPOFOL N/A 12/07/2015   Dr. [facility] Alar: mild proctitis, hyperplastic polyp    ENDOMETRIAL ABLATION     ESOPHAGOGASTRODUODENOSCOPY (EGD) WITH PROPOFOL N/A 12/07/2015   Dr. 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    INCONTINENCE SURGERY     SAVORY DILATION N/A 12/07/2015   Procedure: SAVORY DILATION;  Surgeon: Danie Binder, MD;  [facility];  Service: Endoscopy;  Laterality: N/A;   TUBAL LIGATION     Social History   Socioeconomic History   Marital status: Legally Separated    Spouse name: Not on file   Number of children: Not on file   Years of education: Not on file   Highest education level: Not on file  Occupational History   Occupation: disability  Tobacco Use   Smoking status: Never Smoker   Smokeless tobacco: Never Used  Scientific laboratory technician Use: Never used  Substance and Sexual Activity   Alcohol use: No    Alcohol/week: 0.0 standard drinks   Drug use: No   Sexual activity: Not Currently     Birth control/protection: Surgical    Comment: tubal and ablation  Other Topics Concern   Not on file  Social History Narrative   Not on file   Social Determinants of Health   Financial Resource Strain:    Difficulty of Paying Living Expenses: Not on file  Food Insecurity:    Worried About [place] in the Last Year: Not on file   [place] in the Last Year: Not on file  Transportation Needs:    Lack of Transportation (Medical): Not on file   Lack of Transportation (Non-Medical): Not on file  Physical Activity:    Days of Exercise per Week: Not on file   Minutes of Exercise per Session: Not on file  Stress:    Feeling of Stress : Not on file  Social Connections:    Frequency of Communication with Friends and Family: Not on file   Frequency of Social Gatherings with Friends and Family: Not on file   Attends Religious Services: Not on file   Active Member of Clubs or Organizations: Not on file   Attends Archivist Meetings: Not on file   Marital Status: Not on file   Outpatient Encounter Medications as of 05/27/2020  Medication Sig   acetaminophen (TYLENOL) 325 MG tablet Take 650 mg by mouth as needed.    cetirizine (ZYRTEC) 10 MG tablet  Take 10 mg by mouth daily.   Dimethyl Fumarate (TECFIDERA) 240 MG CPDR Take 240 mg by mouth 2 (two) times daily.   escitalopram (LEXAPRO) 20 MG tablet Take 20 mg by mouth daily.   gabapentin (NEURONTIN) 600 MG tablet Take by mouth.   HYDROcodone-acetaminophen (NORCO/VICODIN) 5-325 MG tablet Take 1 tablet by mouth 2 (two) times daily as needed for moderate pain.   levothyroxine (SYNTHROID) 88 MCG tablet Take 1 tablet (88 mcg total) by mouth daily before breakfast.   LINZESS 290 MCG CAPS capsule TAKE 1 CAPSULE BY MOUTH DAILY BEFORE BREAKFAST.   methocarbamol (ROBAXIN) 500 MG tablet Take 500 mg by mouth 2 (two) times daily.   omeprazole (PRILOSEC) 20 MG capsule Take 20 mg by mouth daily.     RESTASIS 0.05 % ophthalmic emulsion 1 drop 2 (two) times daily.   TOVIAZ 8 MG TB24 tablet Take 8 mg by mouth daily.   UNABLE TO FIND Vit B 12 injection-as needed.   [DISCONTINUED] fesoterodine (TOVIAZ) 4 MG TB24 tablet Take 8 mg by mouth daily.    [DISCONTINUED] gabapentin (NEURONTIN) 300 MG capsule Take 300 mg by mouth in the morning and at bedtime.   [DISCONTINUED] levothyroxine (SYNTHROID) 88 MCG tablet TAKE 1 TABLET BEFORE BREAKFAST.   Vitamin D, Ergocalciferol, (DRISDOL) 1.25 MG (50000 UNIT) CAPS capsule Take 50,000 Units by mouth every 7 (seven) days. (Patient not taking: Reported on 05/27/2020)   [DISCONTINUED] predniSONE (DELTASONE) 5 MG tablet Take 5 mg by mouth daily with breakfast. Taper down starting at 6 tablets for 2 days.  5 tablets for 2 days.  4 tablets for 2 days.  3 tablets for 2 days.  2 tablets for 2 days. 1 tablet for 2 days.   No facility-administered encounter medications on file as of 05/27/2020.   ALLERGIES: Allergies  Allergen Reactions   Nsaids Other (See Comments)    Pt states MD told her not to take due to stomach irritation   Tuberculin Tests Dermatitis   VACCINATION STATUS: Immunization History  Administered Date(s) Administered   Influenza-Unspecified 04/30/2014, 03/15/2015    Thyroid Problem Presents for follow-up (patient with medical history of hypothyroidism from Hashimoto's thyroiditis. ) visit. Patient reports no anxiety, cold intolerance, constipation, depressed mood, diarrhea, fatigue, heat intolerance, palpitations, tremors, weight gain or weight loss. The symptoms have been stable.    She also has history of prediabetes not on medications.  She is also on ongoing supplement for vitamin D deficiency.   Review of systems  Constitutional: + Minimally fluctuating body weight,  current Body mass index is 35.81 kg/m. , no fatigue, no subjective hyperthermia, no subjective hypothermia Eyes: no blurry vision, no xerophthalmia ENT: no  sore throat, no nodules palpated in throat, no dysphagia/odynophagia, no hoarseness Cardiovascular: no chest pain, no shortness of breath, no palpitations, no leg swelling Respiratory: no cough, no shortness of breath Gastrointestinal: no nausea/vomiting/diarrhea Musculoskeletal: no muscle/joint aches, + loses balance at times due to MS Skin: no rashes, no hyperemia Neurological: no tremors, no numbness, no tingling, no dizziness Psychiatric: no depression, no anxiety  Objective:    BP 135/89 (BP Location: Left Arm, Patient Position: Sitting)    Pulse 76    Ht 5\' 4"  (1.626 m)    Wt 208 lb 9.6 oz (94.6 kg)    BMI 35.81 kg/m   Wt Readings from Last 3 Encounters:  05/27/20 208 lb 9.6 oz (94.6 kg)  11/25/19 197 lb 12.8 oz (89.7 kg)  10/13/19 197 lb 3.2 oz (89.4 kg)  BP Readings from Last 3 Encounters:  05/27/20 135/89  11/25/19 137/86  10/13/19 (!) 147/96    Physical Exam- Limited  Constitutional:  Body mass index is 35.81 kg/m. , not in acute distress, normal state of mind Eyes:  EOMI, no exophthalmos Neck: Supple Thyroid: No gross goiter Respiratory: Adequate breathing efforts Musculoskeletal: no gross deformities, strength intact in all four extremities, no gross restriction of joint movements Skin:  no rashes, no hyperemia Neurological: no tremor with outstretched hands,    Recent Results (from the past 2160 hour(s))  TSH     Status: None   Collection Time: 05/18/20  2:30 PM  Result Value Ref Range   TSH 0.80 mIU/L    Comment:           Reference Range .           > or = 20 Years  0.40-4.50 .                Pregnancy Ranges           First trimester    0.26-2.66           Second trimester   0.55-2.73           Third trimester    0.43-2.91   T4, free     Status: None   Collection Time: 05/18/20  2:30 PM  Result Value Ref Range   Free T4 1.3 0.8 - 1.8 ng/dL  VITAMIN D 25 Hydroxy (Vit-D Deficiency, Fractures)     Status: Abnormal   Collection Time: 05/18/20   2:30 PM  Result Value Ref Range   Vit D, 25-Hydroxy 22 (L) 30 - 100 ng/mL    Comment: Vitamin D Status         25-OH Vitamin D: . Deficiency:                    <20 ng/mL Insufficiency:             20 - 29 ng/mL Optimal:                 > or = 30 ng/mL . For 25-OH Vitamin D testing on patients on  D2-supplementation and patients for whom quantitation  of D2 and D3 fractions is required, the [facility](TM) 25-OH VIT D, (D2,D3), LC/MS/MS is recommended: order  code [phone] (patients >27yrs). See Note 1 . Note 1 . For additional information, please refer to  http://education.[facility].com/faq/FAQ199  (This link is being provided for informational/ educational purposes only.)   HgB A1c     Status: Abnormal   Collection Time: 05/27/20  2:58 PM  Result Value Ref Range   Hemoglobin A1C 5.6 4.0 - 5.6 %   HbA1c POC (<> result, manual entry)     HbA1c, POC (prediabetic range)     HbA1c, POC (controlled diabetic range)        Assessment & Plan:   1. Hypothyroidism due to Hashimoto's thyroiditis -Her previsit TFTs are consistent with appropriate hormone replacement.  She is advised to continue Levothyroxine 88 mcg po daily before breakfast.   - We discussed about the correct intake of her thyroid hormone, on empty stomach at fasting, with water, separated by at least 30 minutes from breakfast and other medications,  and separated by more than 4 hours from calcium, iron, multivitamins, acid reflux medications (PPIs). -Patient is made aware of the fact that thyroid hormone replacement is needed for life, dose to be adjusted by periodic monitoring  of thyroid function tests.  2. Pre-diabetes -Her POCT A1C today is 5.6%, stable.  Still will not require any pharmacological intervention at this time.  She has put on significant amount of weight since last visit which is contributing to her A1C.  Weight loss will help.  - Nutritional counseling repeated at each appointment due to  patients tendency to fall back in to old habits.  - The patient admits there is a room for improvement in their diet and drink choices. -  Suggestion is made for the patient to avoid simple carbohydrates from their diet including Cakes, Sweet Desserts / Pastries, Ice Cream, Soda (diet and regular), Sweet Tea, Candies, Chips, Cookies, Sweet Pastries,  Store Bought Juices, Alcohol in Excess of  1-2 drinks a day, Artificial Sweeteners, Coffee Creamer, and "Sugar-free" Products. This will help patient to have stable blood glucose profile and potentially avoid unintended weight gain.   - I encouraged the patient to switch to  unprocessed or minimally processed complex starch and increased protein intake (animal or plant source), fruits, and vegetables.   - Patient is advised to stick to a routine mealtimes to eat 3 meals  a day and avoid unnecessary snacks ( to snack only to correct hypoglycemia).  3.  Vitamin D deficiency- Her most recent vitamin D level was 22 on 05/18/20.  She is advised to restart OTC vitamin D3 5000 units daily as maintenance dose until next measurement.   - I advised patient to maintain close follow up with Rosita Fire, MD for primary care needs.      - Time spent on this patient care encounter:  20 minutes of which 50% was spent in  counseling and the rest reviewing  her current and  previous labs / studies and medications  doses and developing a plan for long term care. Audelia Acton  participated in the discussions, expressed understanding, and voiced agreement with the above plans.  All questions were answered to her satisfaction. she is encouraged to contact clinic should she have any questions or concerns prior to her return visit.    Follow up plan: Return in about 6 months (around 11/25/2020) for Thyroid follow up, Previsit labs.  Rayetta Pigg, [facility] [address] [place], [place] [address] Phone: [phone] Fax:  [phone]  05/27/2020, 3:13 PM

## 2020-06-08 ENCOUNTER — Other Ambulatory Visit (HOSPITAL_COMMUNITY): Payer: Self-pay | Admitting: Internal Medicine

## 2020-06-08 DIAGNOSIS — Z1231 Encounter for screening mammogram for malignant neoplasm of breast: Secondary | ICD-10-CM

## 2020-06-10 ENCOUNTER — Ambulatory Visit: Payer: Medicaid Other | Admitting: Gastroenterology

## 2020-07-26 ENCOUNTER — Ambulatory Visit (HOSPITAL_COMMUNITY)
Admission: RE | Admit: 2020-07-26 | Discharge: 2020-07-26 | Disposition: A | Discharge status: Home / Self Care | Payer: Medicaid Other | Source: Ambulatory Visit | Attending: Internal Medicine | Admitting: Internal Medicine

## 2020-07-26 ENCOUNTER — Other Ambulatory Visit: Payer: Self-pay

## 2020-07-26 DIAGNOSIS — Z1231 Encounter for screening mammogram for malignant neoplasm of breast: Secondary | ICD-10-CM | POA: Insufficient documentation

## 2020-08-10 ENCOUNTER — Other Ambulatory Visit: Payer: Self-pay

## 2020-08-10 ENCOUNTER — Encounter: Payer: Self-pay | Admitting: Gastroenterology

## 2020-08-10 ENCOUNTER — Ambulatory Visit (INDEPENDENT_AMBULATORY_CARE_PROVIDER_SITE_OTHER): Payer: Medicaid Other | Admitting: Gastroenterology

## 2020-08-10 VITALS — BP 145/93 | HR 76 | Temp 97.1°F | Ht 64.0 in | Wt 210.6 lb

## 2020-08-10 DIAGNOSIS — K219 Gastro-esophageal reflux disease without esophagitis: Secondary | ICD-10-CM

## 2020-08-10 DIAGNOSIS — K5900 Constipation, unspecified: Secondary | ICD-10-CM

## 2020-08-10 NOTE — Progress Notes (Signed)
Referring Provider: Rosita Fire, MD Primary Care Physician:  Rosita Fire, MD Primary GI: Dr. Abbey Chatters  Chief Complaint  Patient presents with  . Abdominal Pain    Right lower abd  . Constipation  . Gastroesophageal Reflux    occ    HPI:   Dawn Barnes is a 55 y.o. female presenting today with a history of dysphagia likely secondary to web and/or poor dentition, chronic constipation and GERD.Colonoscopy with mild proctitis in setting of NSAIDs. Edentulous.Last EGD in May 2017. At last visit, patient states her sister may have had colon cancer but now states this is not the case.   BM about once or twice a day the past few days. Sometimes will be once or twice a week. Doesn't drink enough water like she should. Still taking Benefiber.   GERD controlled fairly well. Every now and then will have a flare depending on what she eats. Very rare heartburn.   One day had shooting pain in RLQ but now resolved. Certain foods make her stomach hurt and gassy: beans, ice cream, pizza, sometimes mac n cheese.    Past Medical History:  Diagnosis Date  . Anxiety   . Constipation 04/30/2013  . Depression   . Diabetes mellitus without complication (HCC)    borderline  . Fibroids, intramural 01/21/2016  . GERD (gastroesophageal reflux disease)   . Heartburn   . Hot flashes 01/11/2016  . Mixed incontinence   . MS (multiple sclerosis) ([place])   . Pancreatitis   . PONV (postoperative nausea and vomiting)   . PTSD (post-traumatic stress disorder)   . Thyroid disease   . Urge urinary incontinence 05/10/2015  . Vaginal bleeding 01/11/2016    Past Surgical History:  Procedure Laterality Date  . BIOPSY  12/07/2015   Procedure: BIOPSY;  Surgeon: Danie Binder, MD;  [facility];  Service: Endoscopy;;  right and left colon biopsies, gastric bx's   . COLONOSCOPY WITH PROPOFOL N/A 12/07/2015   Dr. Oneida Alar: mild proctitis, hyperplastic polyp   . ENDOMETRIAL ABLATION    .  ESOPHAGOGASTRODUODENOSCOPY (EGD) WITH PROPOFOL N/A 12/07/2015   Dr. 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   . INCONTINENCE SURGERY    . SAVORY DILATION N/A 12/07/2015   Procedure: SAVORY DILATION;  Surgeon: Danie Binder, MD;  [facility];  Service: Endoscopy;  Laterality: N/A;  . TUBAL LIGATION      Current Outpatient Medications  Medication Sig Dispense Refill  . acetaminophen (TYLENOL) 325 MG tablet Take 650 mg by mouth as needed.     . cetirizine (ZYRTEC) 10 MG tablet Take 10 mg by mouth daily.    . Cholecalciferol (VITAMIN D3) 125 MCG (5000 UT) TABS Take 1 tablet by mouth daily at 12 noon.    . Dimethyl Fumarate 240 MG CPDR Take 240 mg by mouth 2 (two) times daily.    Marland Kitchen escitalopram (LEXAPRO) 20 MG tablet Take 20 mg by mouth daily.    Marland Kitchen gabapentin (NEURONTIN) 600 MG tablet Take 600 mg by mouth 2 (two) times daily.    Marland Kitchen levothyroxine (SYNTHROID) 88 MCG tablet Take 1 tablet (88 mcg total) by mouth daily before breakfast. 90 tablet 3  . LINZESS 290 MCG CAPS capsule TAKE 1 CAPSULE BY MOUTH DAILY BEFORE BREAKFAST. 30 capsule 5  . methocarbamol (ROBAXIN) 500 MG tablet Take 500 mg by mouth 2 (two) times daily.    Marland Kitchen omeprazole (PRILOSEC) 20 MG capsule Take  20 mg by mouth daily.     . RESTASIS 0.05 % ophthalmic emulsion 1 drop 2 (two) times daily.    . TOVIAZ 8 MG TB24 tablet Take 8 mg by mouth daily.    Marland Kitchen UNABLE TO FIND Vit B 12 injection-as needed.    Marland Kitchen HYDROcodone-acetaminophen (NORCO/VICODIN) 5-325 MG tablet Take 1 tablet by mouth 2 (two) times daily as needed for moderate pain. (Patient not taking: Reported on 08/10/2020)    . Vitamin D, Ergocalciferol, (DRISDOL) 1.25 MG (50000 UNIT) CAPS capsule Take 50,000 Units by mouth every 7 (seven) days. (Patient not taking: No sig reported)     No current facility-administered medications for this visit.    Allergies as of 08/10/2020 - Review Complete 08/10/2020  Allergen  Reaction Noted  . Nsaids Other (See Comments) 01/17/2016  . Tuberculin tests Dermatitis 01/18/2015    Family History  Problem Relation Age of Onset  . Diabetes Father   . Heart disease Father   . Hypertension Paternal Aunt   . Heart disease Paternal Aunt   . Seizures Paternal Aunt   . Breast cancer Paternal Aunt   . Heart disease Paternal Grandmother   . Depression Daughter   . Hypertension Sister   . Heart disease Sister   . Colon cancer Sister 16  . Depression Son   . ADD / ADHD Son   . Depression Daughter     Social History   Socioeconomic History  . Marital status: Legally Separated    Spouse name: Not on file  . Number of children: Not on file  . Years of education: Not on file  . Highest education level: Not on file  Occupational History  . Occupation: disability  Tobacco Use  . Smoking status: Never Smoker  . Smokeless tobacco: Never Used  Vaping Use  . Vaping Use: Never used  Substance and Sexual Activity  . Alcohol use: No    Alcohol/week: 0.0 standard drinks  . Drug use: No  . Sexual activity: Not Currently    Birth control/protection: Surgical    Comment: tubal and ablation  Other Topics Concern  . Not on file  Social History Narrative  . Not on file   Social Determinants of Health   Financial Resource Strain: Not on file  Food Insecurity: Not on file  Transportation Needs: Not on file  Physical Activity: Not on file  Stress: Not on file  Social Connections: Not on file    Review of Systems: Gen: Denies fever, chills, anorexia. Denies fatigue, weakness, weight loss.  CV: Denies chest pain, palpitations, syncope, peripheral edema, and claudication. Resp: Denies dyspnea at rest, cough, wheezing, coughing up blood, and pleurisy. GI: see HPI Derm: Denies rash, itching, dry skin Psych: Denies depression, anxiety, memory loss, confusion. No homicidal or suicidal ideation.  Heme: Denies bruising, bleeding, and enlarged lymph nodes.  Physical  Exam: BP (!) 145/93   Pulse 76   Temp (!) 97.1 F (36.2 C) (Temporal)   Ht _0  (1.626 m)   Wt 210 lb 9.6 oz (95.5 kg)   BMI 36.15 kg/m  General:   Alert and oriented. No distress noted. Pleasant and cooperative.  Head:  Normocephalic and atraumatic. Eyes:  Conjuctiva clear without scleral icterus. Mouth:  Mask in place Abdomen:  +BS, soft, non-tender and non-distended. No rebound or guarding. No HSM or masses noted. Msk:  Symmetrical without gross deformities. Normal posture. Extremities:  Without edema. Neurologic:  Alert and  oriented x4 Psych:  Alert and  cooperative. Normal mood and affect.  ASSESSMENT: Dawn Barnes is a 55 y.o. female presenting today with chronic GERD, constipation, dysphagia, for routine follow-up.  GERD overall fairly well controlled on omeprazole daily without alarm symptoms.  Constipation: continues Linzess 290 mcg daily but dietary and water intake affecting regimen. Discussed again increasing water intake, continue fiber, and take Miralax 1 capful on any given day no BM. She declined trial of Amitiza.   Abdominal bloating and gas: appears diet-related. Discussed dietary and behavior modification.    PLAN:  Continue Linzess 290 mcg daily, add Miralax as needed Continue omeprazole daily Gas and bloating handout provided Call if worsening symptoms Return in 6 months   Annitta Needs, PhD, [facility]

## 2020-08-10 NOTE — Patient Instructions (Addendum)
Continue Linzess once daily. You can take a capful of Miralax with 4 ounces of water on any evening that you have not had a good bowel movement.  Please see attached handout regarding food choices to avoid that increase bloating and gas.  Please call if any further right-sided abdominal pain.   We will see you in 6 months!  I enjoyed seeing you again today! As you know, I value our relationship and want to provide genuine, compassionate, and quality care. I welcome your feedback. If you receive a survey regarding your visit,  I greatly appreciate you taking time to fill this out. See you next time!  Annitta Needs, PhD, ANP-BC [facility]   Abdominal Bloating When you have abdominal bloating, your abdomen may feel full, tight, or painful. It may also look bigger than normal or swollen (distended). Common causes of abdominal bloating include:  Swallowing air.  Constipation.  Problems digesting food.  Eating too much.  Irritable bowel syndrome. This is a condition that affects the large intestine.  Lactose intolerance. This is an inability to digest lactose, a natural sugar in dairy products.  Celiac disease. This is a condition that affects the ability to digest gluten, a protein found in some grains.  Gastroparesis. This is a condition that slows down the movement of food in the stomach and small intestine. It is more common in people with diabetes mellitus.  Gastroesophageal reflux disease (GERD). This is a digestive condition that makes stomach acid flow back into the esophagus.  Urinary retention. This means that the body is holding onto urine, and the bladder cannot be emptied all the way. Follow these instructions at home: Eating and drinking  Avoid eating too much.  Try not to swallow air while talking or eating.  Avoid eating while lying down.  Avoid these foods and drinks: ? Foods that cause gas, such as broccoli, cabbage, cauliflower, and baked  beans. ? Carbonated drinks. ? Hard candy. ? Chewing gum. Medicines  Take over-the-counter and prescription medicines only as told by your health care provider.  Take probiotic medicines. These medicines contain live bacteria or yeasts that can help digestion.  Take coated peppermint oil capsules. Activity  Try to exercise regularly. Exercise may help to relieve bloating that is caused by gas and relieve constipation. General instructions  Keep all follow-up visits as told by your health care provider. This is important. Contact a health care provider if:  You have nausea and vomiting.  You have diarrhea.  You have abdominal pain.  You have unusual weight loss or weight gain.  You have severe pain, and medicines do not help. Get help right away if:  You have severe chest pain.  You have trouble breathing.  You have shortness of breath.  You have trouble urinating.  You have darker urine than normal.  You have blood in your stools or have dark, tarry stools. Summary  Abdominal bloating means that the abdomen is swollen.  Common causes of abdominal bloating are swallowing air, constipation, and problems digesting food.  Avoid eating too much and avoid swallowing air.  Avoid foods that cause gas, carbonated drinks, hard candy, and chewing gum. This information is not intended to replace advice given to you by your health care provider. Make sure you discuss any questions you have with your health care provider. Document Revised: 11/04/2018 Document Reviewed: 08/18/2016 Elsevier Patient Education  2021 [employer].

## 2020-09-14 ENCOUNTER — Other Ambulatory Visit: Payer: Self-pay | Admitting: Gastroenterology

## 2020-11-17 LAB — COMPREHENSIVE METABOLIC PANEL
ALT: 8 IU/L (ref 0–32)
AST: 14 IU/L (ref 0–40)
Albumin/Globulin Ratio: 1.3 (ref 1.2–2.2)
Albumin: 4.3 g/dL (ref 3.8–4.9)
Alkaline Phosphatase: 75 IU/L (ref 44–121)
BUN/Creatinine Ratio: 21 (ref 9–23)
BUN: 16 mg/dL (ref 6–24)
Bilirubin Total: 0.3 mg/dL (ref 0.0–1.2)
CO2: 27 mmol/L (ref 20–29)
Calcium: 9.9 mg/dL (ref 8.7–10.2)
Chloride: 103 mmol/L (ref 96–106)
Creatinine, Ser: 0.75 mg/dL (ref 0.57–1.00)
Globulin, Total: 3.2 g/dL (ref 1.5–4.5)
Glucose: 96 mg/dL (ref 65–99)
Potassium: 4.2 mmol/L (ref 3.5–5.2)
Sodium: 144 mmol/L (ref 134–144)
Total Protein: 7.5 g/dL (ref 6.0–8.5)
eGFR: 95 mL/min/{1.73_m2} (ref >59)

## 2020-11-17 LAB — T4, FREE: Free T4: 1.39 ng/dL (ref 0.82–1.77)

## 2020-11-17 LAB — VITAMIN D 25 HYDROXY (VIT D DEFICIENCY, FRACTURES): Vit D, 25-Hydroxy: 57.7 ng/mL (ref 30.0–100.0)

## 2020-11-17 LAB — TSH: TSH: 0.333 u[IU]/mL — ABNORMAL LOW (ref 0.450–4.500)

## 2020-11-25 ENCOUNTER — Encounter: Payer: Self-pay | Admitting: Nurse Practitioner

## 2020-11-25 ENCOUNTER — Other Ambulatory Visit: Payer: Self-pay

## 2020-11-25 ENCOUNTER — Ambulatory Visit (INDEPENDENT_AMBULATORY_CARE_PROVIDER_SITE_OTHER): Payer: Medicaid Other | Admitting: Nurse Practitioner

## 2020-11-25 VITALS — BP 100/68 | HR 70 | Ht 64.0 in | Wt 205.8 lb

## 2020-11-25 DIAGNOSIS — E038 Other specified hypothyroidism: Secondary | ICD-10-CM

## 2020-11-25 DIAGNOSIS — E559 Vitamin D deficiency, unspecified: Secondary | ICD-10-CM | POA: Diagnosis not present

## 2020-11-25 DIAGNOSIS — R7303 Prediabetes: Secondary | ICD-10-CM | POA: Diagnosis not present

## 2020-11-25 LAB — POCT GLYCOSYLATED HEMOGLOBIN (HGB A1C): HbA1c, POC (prediabetic range): 5.7 % (ref 5.7–6.4)

## 2020-11-25 NOTE — Progress Notes (Signed)
11/25/2020  Endocrinology follow-up note    Subjective:    Patient ID: Dawn Barnes, female    DOB: 1965-11-04, PCP Rosita Fire, MD   Past Medical History:  Diagnosis Date  . Anxiety   . Constipation 04/30/2013  . Depression   . Diabetes mellitus without complication (HCC)    borderline  . Fibroids, intramural 01/21/2016  . GERD (gastroesophageal reflux disease)   . Heartburn   . Hot flashes 01/11/2016  . Mixed incontinence   . MS (multiple sclerosis) ([place])   . Pancreatitis   . PONV (postoperative nausea and vomiting)   . PTSD (post-traumatic stress disorder)   . Thyroid disease   . Urge urinary incontinence 05/10/2015  . Vaginal bleeding 01/11/2016   Past Surgical History:  Procedure Laterality Date  . BIOPSY  12/07/2015   Procedure: BIOPSY;  Surgeon: Danie Binder, MD;  [facility];  Service: Endoscopy;;  right and left colon biopsies, gastric bx's   . COLONOSCOPY WITH PROPOFOL N/A 12/07/2015   Dr. Oneida Alar: mild proctitis, hyperplastic polyp   . ENDOMETRIAL ABLATION    . ESOPHAGOGASTRODUODENOSCOPY (EGD) WITH PROPOFOL N/A 12/07/2015   Dr. 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   . INCONTINENCE SURGERY    . SAVORY DILATION N/A 12/07/2015   Procedure: SAVORY DILATION;  Surgeon: Danie Binder, MD;  [facility];  Service: Endoscopy;  Laterality: N/A;  . TUBAL LIGATION     Social History   Socioeconomic History  . Marital status: Legally Separated    Spouse name: Not on file  . Number of children: Not on file  . Years of education: Not on file  . Highest education level: Not on file  Occupational History  . Occupation: disability  Tobacco Use  . Smoking status: Never Smoker  . Smokeless tobacco: Never Used  Vaping Use  . Vaping Use: Never used  Substance and Sexual Activity  . Alcohol use: No    Alcohol/week: 0.0 standard drinks  . Drug use: No  . Sexual activity: Not Currently     Birth control/protection: Surgical    Comment: tubal and ablation  Other Topics Concern  . Not on file  Social History Narrative  . Not on file   Social Determinants of Health   Financial Resource Strain: Not on file  Food Insecurity: Not on file  Transportation Needs: Not on file  Physical Activity: Not on file  Stress: Not on file  Social Connections: Not on file   Outpatient Encounter Medications as of 11/25/2020  Medication Sig  . acetaminophen (TYLENOL) 325 MG tablet Take 650 mg by mouth as needed.   . cetirizine (ZYRTEC) 10 MG tablet Take 10 mg by mouth daily.  . Cholecalciferol (VITAMIN D3) 125 MCG (5000 UT) TABS Take 1 tablet by mouth daily at 12 noon.  . Dimethyl Fumarate 240 MG CPDR Take 240 mg by mouth 2 (two) times daily.  Marland Kitchen escitalopram (LEXAPRO) 20 MG tablet Take 20 mg by mouth daily.  Marland Kitchen gabapentin (NEURONTIN) 600 MG tablet Take 600 mg by mouth 2 (two) times daily.  . hydrochlorothiazide (HYDRODIURIL) 12.5 MG tablet Take 12.5 mg by mouth daily.  Marland Kitchen levothyroxine (SYNTHROID) 88 MCG tablet Take 1 tablet (88 mcg total) by mouth daily before breakfast.  . LINZESS 290 MCG CAPS capsule TAKE 1 CAPSULE BY MOUTH DAILY BEFORE BREAKFAST.  . methocarbamol (ROBAXIN) 500 MG tablet Take 500 mg by mouth 2 (two) times daily.  Marland Kitchen  omeprazole (PRILOSEC) 20 MG capsule Take 20 mg by mouth daily.   . RESTASIS 0.05 % ophthalmic emulsion 1 drop 2 (two) times daily.  . TOVIAZ 8 MG TB24 tablet Take 8 mg by mouth daily.  Marland Kitchen UNABLE TO FIND Vit B 12 injection-as needed.   No facility-administered encounter medications on file as of 11/25/2020.   ALLERGIES: Allergies  Allergen Reactions  . Nsaids Other (See Comments)    Pt states MD told her not to take due to stomach irritation  . Tuberculin Tests Dermatitis   VACCINATION STATUS: Immunization History  Administered Date(s) Administered  . Influenza-Unspecified 04/30/2014, 03/15/2015    Thyroid Problem Presents for follow-up (patient with  medical history of hypothyroidism from Hashimoto's thyroiditis. ) visit. Symptoms include fatigue. Patient reports no anxiety, cold intolerance, constipation, depressed mood, diarrhea, heat intolerance, palpitations, tremors, weight gain or weight loss. The symptoms have been stable.    She also has history of prediabetes not on medications.  She is also on ongoing supplement for vitamin D deficiency.   Review of systems  Constitutional: + Minimally fluctuating body weight,  current Body mass index is 35.33 kg/m. , + intermittent fatigue, no subjective hyperthermia, no subjective hypothermia Eyes: no blurry vision, no xerophthalmia ENT: no sore throat, no nodules palpated in throat, no dysphagia/odynophagia, no hoarseness Cardiovascular: no chest pain, no shortness of breath, no palpitations, no leg swelling Respiratory: no cough, no shortness of breath Gastrointestinal: no nausea/vomiting/diarrhea Musculoskeletal: no muscle/joint aches, + loses balance at times due to MS Skin: no rashes, no hyperemia Neurological: no tremors, no numbness, no tingling, no dizziness Psychiatric: no depression, no anxiety  Objective:    BP 100/68 (BP Location: Right Arm, Patient Position: Sitting)   Pulse 70   Ht _0  (1.626 m)   Wt 205 lb 12.8 oz (93.4 kg)   BMI 35.33 kg/m   Wt Readings from Last 3 Encounters:  11/25/20 205 lb 12.8 oz (93.4 kg)  08/10/20 210 lb 9.6 oz (95.5 kg)  05/27/20 208 lb 9.6 oz (94.6 kg)     BP Readings from Last 3 Encounters:  11/25/20 100/68  08/10/20 (!) 145/93  05/27/20 135/89    Physical Exam- Limited  Constitutional:  Body mass index is 35.33 kg/m. , not in acute distress, normal state of mind Eyes:  EOMI, no exophthalmos Neck: Supple Respiratory: Adequate breathing efforts Musculoskeletal: no gross deformities, strength intact in all four extremities, no gross restriction of joint movements Skin:  no rashes, no hyperemia Neurological: no tremor with  outstretched hands,    Recent Results (from the past 2160 hour(s))  Comprehensive metabolic panel     Status: None   Collection Time: 11/16/20  4:36 PM  Result Value Ref Range   Glucose 96 65 - 99 mg/dL   BUN 16 6 - 24 mg/dL   Creatinine, Ser 0.75 0.57 - 1.00 mg/dL   eGFR 95 >59 mL/min/1.73   BUN/Creatinine Ratio 21 9 - 23   Sodium 144 134 - 144 mmol/L   Potassium 4.2 3.5 - 5.2 mmol/L   Chloride 103 96 - 106 mmol/L   CO2 27 20 - 29 mmol/L   Calcium 9.9 8.7 - 10.2 mg/dL   Total Protein 7.5 6.0 - 8.5 g/dL   Albumin 4.3 3.8 - 4.9 g/dL   Globulin, Total 3.2 1.5 - 4.5 g/dL   Albumin/Globulin Ratio 1.3 1.2 - 2.2   Bilirubin Total 0.3 0.0 - 1.2 mg/dL   Alkaline Phosphatase 75 44 - 121 IU/L  AST 14 0 - 40 IU/L   ALT 8 0 - 32 IU/L  TSH     Status: Abnormal   Collection Time: 11/16/20  4:36 PM  Result Value Ref Range   TSH 0.333 (L) 0.450 - 4.500 uIU/mL  T4, free     Status: None   Collection Time: 11/16/20  4:36 PM  Result Value Ref Range   Free T4 1.39 0.82 - 1.77 ng/dL  VITAMIN D 25 Hydroxy (Vit-D Deficiency, Fractures)     Status: None   Collection Time: 11/16/20  4:36 PM  Result Value Ref Range   Vit D, 25-Hydroxy 57.7 30.0 - 100.0 ng/mL    Comment: Vitamin D deficiency has been defined by the [place] and an Endocrine Society practice guideline as a level of serum 25-OH vitamin D less than 20 ng/mL (1,2). The Endocrine Society went on to further define vitamin D insufficiency as a level between 21 and 29 ng/mL (2). 1. IOM (Institute of Medicine). 2010. Dietary reference    intakes for calcium and D. [place]: The    [employer]. 2. Holick MF, Binkley [place], Bischoff-Ferrari HA, et al.    Evaluation, treatment, and prevention of vitamin D    deficiency: an Endocrine Society clinical practice    guideline. JCEM. 2011 Jul; 96(7):1911-30.   HgB A1c     Status: Abnormal   Collection Time: 11/25/20  2:50 PM  Result Value Ref Range   Hemoglobin  A1C     HbA1c POC (<> result, manual entry)     HbA1c, POC (prediabetic range) 5.7 5.7 - 6.4 %   HbA1c, POC (controlled diabetic range)        Assessment & Plan:   1. Hypothyroidism due to Hashimoto's thyroiditis -Her previsit TFTs are consistent with appropriate hormone replacement.  She is advised to continue Levothyroxine 88 mcg po daily before breakfast.   - We discussed about the correct intake of her thyroid hormone, on empty stomach at fasting, with water, separated by at least 30 minutes from breakfast and other medications,  and separated by more than 4 hours from calcium, iron, multivitamins, acid reflux medications (PPIs). -Patient is made aware of the fact that thyroid hormone replacement is needed for life, dose to be adjusted by periodic monitoring of thyroid function tests.  2. Pre-diabetes -Her POCT A1C today is 5.7%, stable.  Still will not require any pharmacological intervention at this time.  Continued weight loss will help.  - Nutritional counseling repeated at each appointment due to patients tendency to fall back in to old habits.  - The patient admits there is a room for improvement in their diet and drink choices. -  Suggestion is made for the patient to avoid simple carbohydrates from their diet including Cakes, Sweet Desserts / Pastries, Ice Cream, Soda (diet and regular), Sweet Tea, Candies, Chips, Cookies, Sweet Pastries,  Store Bought Juices, Alcohol in Excess of  1-2 drinks a day, Artificial Sweeteners, Coffee Creamer, and "Sugar-free" Products. This will help patient to have stable blood glucose profile and potentially avoid unintended weight gain.   - I encouraged the patient to switch to  unprocessed or minimally processed complex starch and increased protein intake (animal or plant source), fruits, and vegetables.   - Patient is advised to stick to a routine mealtimes to eat 3 meals  a day and avoid unnecessary snacks ( to snack only to correct  hypoglycemia).  3.  Vitamin D deficiency- Her most recent vitamin  D level was 57.7 on 11/16/20.  She is advised to restart OTC vitamin D3 5000 units daily as maintenance dose until next measurement.   - I advised patient to maintain close follow up with Rosita Fire, MD for primary care needs.     I spent 22 minutes in the care of the patient today including review of labs from Thyroid Function, CMP, and other relevant labs ; imaging/biopsy records (current and previous including abstractions from other facilities); face-to-face time discussing  her lab results and symptoms, medications doses, her options of short and long term treatment based on the latest standards of care / guidelines;   and documenting the encounter.  Audelia Acton  participated in the discussions, expressed understanding, and voiced agreement with the above plans.  All questions were answered to her satisfaction. she is encouraged to contact clinic should she have any questions or concerns prior to her return visit.    Follow up plan: Return in about 6 months (around 05/27/2021) for Thyroid follow up, Previsit labs.  Rayetta Pigg, [facility] [address] [place], [place] [address] Phone: [phone] Fax: [phone]  11/25/2020, 2:57 PM

## 2020-11-25 NOTE — Patient Instructions (Signed)

## 2020-12-06 ENCOUNTER — Other Ambulatory Visit: Payer: Self-pay | Admitting: "Endocrinology

## 2021-02-08 ENCOUNTER — Encounter: Payer: Self-pay | Admitting: Gastroenterology

## 2021-02-08 ENCOUNTER — Ambulatory Visit: Payer: Medicaid Other | Admitting: Gastroenterology

## 2021-02-08 ENCOUNTER — Other Ambulatory Visit: Payer: Self-pay

## 2021-02-08 VITALS — BP 113/74 | HR 80 | Temp 98.0°F | Ht 64.0 in | Wt 205.0 lb

## 2021-02-08 DIAGNOSIS — K5900 Constipation, unspecified: Secondary | ICD-10-CM | POA: Diagnosis not present

## 2021-02-08 DIAGNOSIS — K219 Gastro-esophageal reflux disease without esophagitis: Secondary | ICD-10-CM

## 2021-02-08 NOTE — Patient Instructions (Signed)
Continue taking Linzess each morning. You can add 1 capful of Miralax daily as needed to have better bowel movements. If this is not helpful, let me know. There is another medication we can try.  We will see you in 6-8 months!  I enjoyed seeing you again today! As you know, I value our relationship and want to provide genuine, compassionate, and quality care. I welcome your feedback. If you receive a survey regarding your visit,  I greatly appreciate you taking time to fill this out. See you next time!  Annitta Needs, PhD, ANP-BC [facility]

## 2021-02-08 NOTE — Progress Notes (Signed)
Referring Provider: Rosita Fire, MD Primary Care Physician:  Rosita Fire, MD Primary GI: Dr. Abbey Chatters  Chief Complaint  Patient presents with   Gastroesophageal Reflux    occ   Constipation    Occ. Had few episodes of being unable to make it to bathroom to have bm   Abdominal Pain    HPI:   Dawn Barnes is a 55 y.o. female presenting today with a history of dysphagia likely secondary to web and/or poor dentition, chronic constipation and GERD. Colonoscopy with mild proctitis in setting of NSAIDs. Edentulous. Last EGD in May 2017.  GERD: omeprazole daily. Occasional flares related to diet. Ate late last night after eating chips and had a flare.    Constipation: sometimes "rumbling" through stomach with discomfort. Had a few accidents where she couldn't hold it then all the sudden came out. Does not have a BM daily. Sometimes skips. Some straining. Doesn't want to trial Amitiza yet. Willing to add Miralax and stick with Linzess for now.   Past Medical History:  Diagnosis Date   Anxiety    Constipation 04/30/2013   Depression    Diabetes mellitus without complication (HCC)    borderline   Fibroids, intramural 01/21/2016   GERD (gastroesophageal reflux disease)    Heartburn    Hot flashes 01/11/2016   Mixed incontinence    MS (multiple sclerosis) (HCC)    Pancreatitis    PONV (postoperative nausea and vomiting)    PTSD (post-traumatic stress disorder)    Thyroid disease    Urge urinary incontinence 05/10/2015   Vaginal bleeding 01/11/2016    Past Surgical History:  Procedure Laterality Date   BIOPSY  12/07/2015   Procedure: BIOPSY;  Surgeon: Danie Binder, MD;  [facility];  Service: Endoscopy;;  right and left colon biopsies, gastric bx's    COLONOSCOPY WITH PROPOFOL N/A 12/07/2015   Dr. Oneida Alar: mild proctitis, hyperplastic polyp    ENDOMETRIAL ABLATION     ESOPHAGOGASTRODUODENOSCOPY (EGD) WITH PROPOFOL N/A 12/07/2015   Dr. Oneida Alar: possible web in  proximal esophagus s/p dilation, erosive gastritis (negative H.pylori).    INCONTINENCE SURGERY     SAVORY DILATION N/A 12/07/2015   Procedure: SAVORY DILATION;  Surgeon: Danie Binder, MD;  Location: [facility];  Service: Endoscopy;  Laterality: N/A;   TUBAL LIGATION      Current Outpatient Medications  Medication Sig Dispense Refill   acetaminophen (TYLENOL) 325 MG tablet Take 650 mg by mouth as needed.      cetirizine (ZYRTEC) 10 MG tablet Take 10 mg by mouth daily.     Cholecalciferol (VITAMIN D3) 125 MCG (5000 UT) TABS Take 1 tablet by mouth daily at 12 noon.     Dimethyl Fumarate 240 MG CPDR Take 240 mg by mouth 2 (two) times daily.     escitalopram (LEXAPRO) 20 MG tablet Take 20 mg by mouth daily.     gabapentin (NEURONTIN) 600 MG tablet Take 600 mg by mouth 2 (two) times daily.     hydrochlorothiazide (HYDRODIURIL) 12.5 MG tablet Take 12.5 mg by mouth daily.     levothyroxine (SYNTHROID) 88 MCG tablet Take 1 tablet (88 mcg total) by mouth daily before breakfast. 90 tablet 3   LINZESS 290 MCG CAPS capsule TAKE 1 CAPSULE BY MOUTH DAILY BEFORE BREAKFAST. 30 capsule 5   methocarbamol (ROBAXIN) 500 MG tablet Take 500 mg by mouth 2 (two) times daily.     omeprazole (PRILOSEC) 20 MG capsule Take  20 mg by mouth daily.      TOVIAZ 8 MG TB24 tablet Take 8 mg by mouth daily.     UNABLE TO FIND every 3 (three) months. Vit B 12 injection-every 3 months     No current facility-administered medications for this visit.    Allergies as of 02/08/2021 - Review Complete 02/08/2021  Allergen Reaction Noted   Nsaids Other (See Comments) 01/17/2016   Tuberculin tests Dermatitis 01/18/2015    Family History  Problem Relation Age of Onset   Diabetes Father    Heart disease Father    Hypertension Paternal Aunt    Heart disease Paternal Aunt    Seizures Paternal 57    Breast cancer Paternal Aunt    Heart disease Paternal 58    Depression Daughter    Hypertension Sister    Heart  disease Sister    Depression Son    ADD / ADHD Son    Depression Daughter    Colon cancer Neg Hx    Colon polyps Neg Hx     Social History   Socioeconomic History   Marital status: Legally Separated    Spouse name: Not on file   Number of children: Not on file   Years of education: Not on file   Highest education level: Not on file  Occupational History   Occupation: disability  Tobacco Use   Smoking status: Never   Smokeless tobacco: Never  Vaping Use   Vaping Use: Never used  Substance and Sexual Activity   Alcohol use: No    Alcohol/week: 0.0 standard drinks   Drug use: No   Sexual activity: Not Currently    Birth control/protection: Surgical    Comment: tubal and ablation  Other Topics Concern   Not on file  Social History Narrative   Not on file   Social Determinants of Health   Financial Resource Strain: Not on file  Food Insecurity: Not on file  Transportation Needs: Not on file  Physical Activity: Not on file  Stress: Not on file  Social Connections: Not on file    Review of Systems: Gen: Denies fever, chills, anorexia. Denies fatigue, weakness, weight loss.  CV: Denies chest pain, palpitations, syncope, peripheral edema, and claudication. Resp: Denies dyspnea at rest, cough, wheezing, coughing up blood, and pleurisy. GI: see HPI Derm: Denies rash, itching, dry skin Psych: Denies depression, anxiety, memory loss, confusion. No homicidal or suicidal ideation.  Heme: Denies bruising, bleeding, and enlarged lymph nodes.  Physical Exam: BP 113/74   Pulse 80   Temp 98 F (36.7 C) (Temporal)   Ht 5\' 4"  (1.626 m)   Wt 205 lb (93 kg)   BMI 35.19 kg/m  General:   Alert and oriented. No distress noted. Pleasant and cooperative.  Head:  Normocephalic and atraumatic. Eyes:  Conjuctiva clear without scleral icterus. Mouth:  mask in place Abdomen:  +BS, soft, non-tender and non-distended. No rebound or guarding. No HSM or masses noted. Msk:  Symmetrical  without gross deformities. Normal posture. Extremities:  Without edema. Neurologic:  Alert and  oriented x4 Psych:  Alert and cooperative. Normal mood and affect.  ASSESSMENT: Dawn Barnes is a 55 y.o. female presenting today in routine follow-up for GERD and constipation.   GERD remains overall fairly well-controlled with some exacerbations related to diet. She is well aware of diet/behavior modifications. Continue omeprazole daily.   Constipation is not ideally managed. She desires to continue Linzess, and we will add Miralax daily as  needed. I have offered Amitiza, but she prefers to hold off on this now.    PLAN:  Continue omeprazole daily Continue Linzess daily and add Miralax daily prn Consider Amitiza if patient willing Return in 6-8 months  Annitta Needs, PhD, [facility]

## 2021-04-01 ENCOUNTER — Other Ambulatory Visit: Payer: Self-pay

## 2021-04-01 ENCOUNTER — Ambulatory Visit
Admission: EM | Admit: 2021-04-01 | Discharge: 2021-04-01 | Disposition: A | Discharge status: Home / Self Care | Payer: Medicaid Other | Attending: Emergency Medicine | Admitting: Emergency Medicine

## 2021-04-01 ENCOUNTER — Encounter: Payer: Self-pay | Admitting: Emergency Medicine

## 2021-04-01 DIAGNOSIS — R21 Rash and other nonspecific skin eruption: Secondary | ICD-10-CM | POA: Diagnosis not present

## 2021-04-01 MED ORDER — PREDNISONE 20 MG PO TABS: 20.0000 mg | ORAL_TABLET | Freq: Two times a day (BID) | ORAL | 0 refills | Status: AC

## 2021-04-01 NOTE — ED Provider Notes (Signed)
[place]   [id number] 04/01/21 Arrival Time: [id number]  CC: rash   SUBJECTIVE:  Dawn Barnes is a 55 y.o. female who presents with an itchy rash that began 1 day ago.  Admits to changes in detergent and fabric softener.  Denies alleviating or aggravating factors.  Denies similar symptoms in the past.   Denies fever, chills, nausea, vomiting, erythema, swelling, discharge, oral lesions, SOB, chest pain.    ROS: As per HPI.  All other pertinent ROS negative.     Past Medical History:  Diagnosis Date   Anxiety    Constipation 04/30/2013   Depression    Diabetes mellitus without complication (HCC)    borderline   Fibroids, intramural 01/21/2016   GERD (gastroesophageal reflux disease)    Heartburn    Hot flashes 01/11/2016   Mixed incontinence    MS (multiple sclerosis) (HCC)    Pancreatitis    PONV (postoperative nausea and vomiting)    PTSD (post-traumatic stress disorder)    Thyroid disease    Urge urinary incontinence 05/10/2015   Vaginal bleeding 01/11/2016   Past Surgical History:  Procedure Laterality Date   BIOPSY  12/07/2015   Procedure: BIOPSY;  Surgeon: Danie Binder, MD;  [facility]: [facility];  Service: Endoscopy;;  right and left colon biopsies, gastric bx's    COLONOSCOPY WITH PROPOFOL N/A 12/07/2015   Dr. Oneida Alar: mild proctitis, hyperplastic polyp    ENDOMETRIAL ABLATION     ESOPHAGOGASTRODUODENOSCOPY (EGD) WITH PROPOFOL N/A 12/07/2015   Dr. 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    INCONTINENCE SURGERY     SAVORY DILATION N/A 12/07/2015   Procedure: SAVORY DILATION;  Surgeon: Danie Binder, MD;  [facility]: [facility];  Service: Endoscopy;  Laterality: N/A;   TUBAL LIGATION     Allergies  Allergen Reactions   Nsaids Other (See Comments)    Pt states MD told her not to take due to stomach irritation   Tuberculin Tests Dermatitis   No current facility-administered medications on file prior to  encounter.   Current Outpatient Medications on File Prior to Encounter  Medication Sig Dispense Refill   acetaminophen (TYLENOL) 325 MG tablet Take 650 mg by mouth as needed.      cetirizine (ZYRTEC) 10 MG tablet Take 10 mg by mouth daily.     Cholecalciferol (VITAMIN D3) 125 MCG (5000 UT) TABS Take 1 tablet by mouth daily at 12 noon.     Dimethyl Fumarate 240 MG CPDR Take 240 mg by mouth 2 (two) times daily.     escitalopram (LEXAPRO) 20 MG tablet Take 20 mg by mouth daily.     gabapentin (NEURONTIN) 600 MG tablet Take 600 mg by mouth 2 (two) times daily.     hydrochlorothiazide (HYDRODIURIL) 12.5 MG tablet Take 12.5 mg by mouth daily.     levothyroxine (SYNTHROID) 88 MCG tablet Take 1 tablet (88 mcg total) by mouth daily before breakfast. 90 tablet 3   LINZESS 290 MCG CAPS capsule TAKE 1 CAPSULE BY MOUTH DAILY BEFORE BREAKFAST. 30 capsule 5   methocarbamol (ROBAXIN) 500 MG tablet Take 500 mg by mouth 2 (two) times daily.     omeprazole (PRILOSEC) 20 MG capsule Take 20 mg by mouth daily.      TOVIAZ 8 MG TB24 tablet Take 8 mg by mouth daily.     UNABLE TO FIND every 3 (three) months. Vit B 12 injection-every 3 months     Social  History   Socioeconomic History   Marital status: Legally Separated    Spouse name: Not on file   Number of children: Not on file   Years of education: Not on file   Highest education level: Not on file  Occupational History   Occupation: disability  Tobacco Use   Smoking status: Never   Smokeless tobacco: Never  Vaping Use   Vaping Use: Never used  Substance and Sexual Activity   Alcohol use: No    Alcohol/week: 0.0 standard drinks   Drug use: No   Sexual activity: Not Currently    Birth control/protection: Surgical    Comment: tubal and ablation  Other Topics Concern   Not on file  Social History Narrative   Not on file   Social Determinants of Health   Financial Resource Strain: Not on file  Food Insecurity: Not on file  Transportation  Needs: Not on file  Physical Activity: Not on file  Stress: Not on file  Social Connections: Not on file  Intimate Partner Violence: Not on file   Family History  Problem Relation Age of Onset   Diabetes Father    Heart disease Father    Hypertension Paternal Aunt    Heart disease Paternal Aunt    Seizures Paternal 80    Breast cancer Paternal Aunt    Heart disease Paternal 39    Depression Daughter    Hypertension Sister    Heart disease Sister    Depression Son    ADD / ADHD Son    Depression Daughter    Colon cancer Neg Hx    Colon polyps Neg Hx     OBJECTIVE: Vitals:   04/01/21 1601  BP: 121/78  Pulse: 77  Resp: 18  Temp: 98.7 F (37.1 C)  TempSrc: Oral  SpO2: 94%    General appearance: alert; no distress Head: NCAT Lungs: normal respiratory effort Extremities: no edema Skin: warm and dry; erythematous papules/ pustules in nonspecific sparse distribution to abdomen, NTTP, no drainage or bleeding, some scab formation  Psychological: alert and cooperative; normal mood and affect  ASSESSMENT & PLAN:  1. Rash and nonspecific skin eruption    Meds ordered this encounter  Medications   predniSONE (DELTASONE) 20 MG tablet    Sig: Take 1 tablet (20 mg total) by mouth 2 (two) times daily with a meal for 5 days.    Dispense:  10 tablet    Refill:  0    Order Specific Question:   Supervising Provider    Answer:   Raylene Everts [id number]   Steroid for rash Follow up with PCP if symptoms persists Return or go to the ER if you have any new or worsening symptoms such as fever, chills, nausea, vomiting, redness, swelling, discharge, if symptoms do not improve with medications, etc...  Reviewed expectations re: course of current medical issues. Questions answered. Outlined signs and symptoms indicating need for more acute intervention. Patient verbalized understanding. After Visit Summary given.    Lestine Box, PA-C 04/01/21 1628

## 2021-04-01 NOTE — Discharge Instructions (Addendum)
Steroid for rash Follow up with PCP if symptoms persists Return or go to the ER if you have any new or worsening symptoms such as fever, chills, nausea, vomiting, redness, swelling, discharge, if symptoms do not improve with medications, etc..Marland Kitchen

## 2021-04-01 NOTE — ED Triage Notes (Signed)
Itchy rash on stomach and back since yesterday. RT foot pain when standing and walking, denies injury x 23month   Pt has ms and made an appointment on sept 12 for this problem.

## 2021-04-16 ENCOUNTER — Other Ambulatory Visit: Payer: Self-pay | Admitting: Gastroenterology

## 2021-05-21 LAB — T4, FREE: Free T4: 1.27 ng/dL (ref 0.82–1.77)

## 2021-05-21 LAB — TSH: TSH: 0.204 u[IU]/mL — ABNORMAL LOW (ref 0.450–4.500)

## 2021-05-30 ENCOUNTER — Encounter: Payer: Self-pay | Admitting: Nurse Practitioner

## 2021-05-30 ENCOUNTER — Ambulatory Visit (INDEPENDENT_AMBULATORY_CARE_PROVIDER_SITE_OTHER): Payer: Medicaid Other | Admitting: Nurse Practitioner

## 2021-05-30 ENCOUNTER — Other Ambulatory Visit: Payer: Self-pay

## 2021-05-30 VITALS — BP 106/71 | HR 60 | Ht 64.0 in | Wt 205.8 lb

## 2021-05-30 DIAGNOSIS — E038 Other specified hypothyroidism: Secondary | ICD-10-CM | POA: Diagnosis not present

## 2021-05-30 DIAGNOSIS — E559 Vitamin D deficiency, unspecified: Secondary | ICD-10-CM | POA: Diagnosis not present

## 2021-05-30 DIAGNOSIS — R7303 Prediabetes: Secondary | ICD-10-CM

## 2021-05-30 LAB — POCT GLYCOSYLATED HEMOGLOBIN (HGB A1C): HbA1c, POC (controlled diabetic range): 5.8 % (ref 0.0–7.0)

## 2021-05-30 MED ORDER — LEVOTHYROXINE SODIUM 88 MCG PO TABS: 88.0000 ug | ORAL_TABLET | Freq: Every day | ORAL | 3 refills | Status: DC

## 2021-05-30 NOTE — Progress Notes (Signed)
05/30/2021  Endocrinology follow-up note    Subjective:    Patient ID: Dawn Barnes, female    DOB: 06-Nov-1965, PCP Rosita Fire, MD   Past Medical History:  Diagnosis Date   Anxiety    Constipation 04/30/2013   Depression    Diabetes mellitus without complication ([place])    borderline   Fibroids, intramural 01/21/2016   GERD (gastroesophageal reflux disease)    Heartburn    Hot flashes 01/11/2016   Mixed incontinence    MS (multiple sclerosis) (HCC)    Pancreatitis    PONV (postoperative nausea and vomiting)    PTSD (post-traumatic stress disorder)    Thyroid disease    Urge urinary incontinence 05/10/2015   Vaginal bleeding 01/11/2016   Past Surgical History:  Procedure Laterality Date   BIOPSY  12/07/2015   Procedure: BIOPSY;  Surgeon: Danie Binder, MD;  [facility];  Service: Endoscopy;;  right and left colon biopsies, gastric bx's    COLONOSCOPY WITH PROPOFOL N/A 12/07/2015   Dr. Oneida Alar: mild proctitis, hyperplastic polyp    ENDOMETRIAL ABLATION     ESOPHAGOGASTRODUODENOSCOPY (EGD) WITH PROPOFOL N/A 12/07/2015   Dr. 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    INCONTINENCE SURGERY     SAVORY DILATION N/A 12/07/2015   Procedure: SAVORY DILATION;  Surgeon: Danie Binder, MD;  [facility]: [facility];  Service: Endoscopy;  Laterality: N/A;   TUBAL LIGATION     Social History   Socioeconomic History   Marital status: Legally Separated    Spouse name: Not on file   Number of children: Not on file   Years of education: Not on file   Highest education level: Not on file  Occupational History   Occupation: disability  Tobacco Use   Smoking status: Never   Smokeless tobacco: Never  Vaping Use   Vaping Use: Never used  Substance and Sexual Activity   Alcohol use: No    Alcohol/week: 0.0 standard drinks   Drug use: No   Sexual activity: Not Currently    Birth control/protection: Surgical    Comment:  tubal and ablation  Other Topics Concern   Not on file  Social History Narrative   Not on file   Social Determinants of Health   Financial Resource Strain: Not on file  Food Insecurity: Not on file  Transportation Needs: Not on file  Physical Activity: Not on file  Stress: Not on file  Social Connections: Not on file   Outpatient Encounter Medications as of 05/30/2021  Medication Sig   acetaminophen (TYLENOL) 325 MG tablet Take 650 mg by mouth as needed.    cetirizine (ZYRTEC) 10 MG tablet Take 10 mg by mouth daily.   Cholecalciferol (VITAMIN D3) 125 MCG (5000 UT) TABS Take 1 tablet by mouth daily at 12 noon.   Dimethyl Fumarate 240 MG CPDR Take 240 mg by mouth 2 (two) times daily.   escitalopram (LEXAPRO) 20 MG tablet Take 20 mg by mouth daily.   gabapentin (NEURONTIN) 600 MG tablet Take 600 mg by mouth 2 (two) times daily.   hydrochlorothiazide (HYDRODIURIL) 12.5 MG tablet Take 12.5 mg by mouth daily.   levothyroxine (SYNTHROID) 88 MCG tablet Take 1 tablet (88 mcg total) by mouth daily before breakfast.   LINZESS 290 MCG CAPS capsule TAKE 1 CAPSULE BY MOUTH DAILY BEFORE BREAKFAST.   methocarbamol (ROBAXIN) 500 MG tablet Take 500 mg by mouth 2 (two) times daily.   omeprazole (PRILOSEC)  20 MG capsule Take 20 mg by mouth daily.    TOVIAZ 8 MG TB24 tablet Take 8 mg by mouth daily.   UNABLE TO FIND every 3 (three) months. Vit B 12 injection-every 3 months   No facility-administered encounter medications on file as of 05/30/2021.   ALLERGIES: Allergies  Allergen Reactions   Ibuprofen Other (See Comments)   Nsaids Other (See Comments)    Pt states MD told her not to take due to stomach irritation   Tuberculin Tests Dermatitis   VACCINATION STATUS: Immunization History  Administered Date(s) Administered   Influenza-Unspecified 04/30/2014, 03/15/2015    Thyroid Problem Presents for follow-up (patient with medical history of hypothyroidism from Hashimoto's thyroiditis. )  visit. Symptoms include fatigue. Patient reports no anxiety, cold intolerance, constipation, depressed mood, diarrhea, heat intolerance, palpitations, tremors, weight gain or weight loss. The symptoms have been stable.   She also has history of prediabetes not on medications.  She is also on ongoing supplement for vitamin D deficiency.   Review of systems  Constitutional: + Minimally fluctuating body weight,  current Body mass index is 35.33 kg/m. , + intermittent fatigue, no subjective hyperthermia, no subjective hypothermia Eyes: no blurry vision, no xerophthalmia ENT: no sore throat, no nodules palpated in throat, no dysphagia/odynophagia, no hoarseness Cardiovascular: no chest pain, no shortness of breath, no palpitations, no leg swelling Respiratory: no cough, no shortness of breath Gastrointestinal: no nausea/vomiting/diarrhea Musculoskeletal: no muscle/joint aches, + loses balance at times due to MS, generalized weakness (usually in BLE) Skin: no rashes, no hyperemia Neurological: no tremors, no numbness, no tingling, no dizziness Psychiatric: no depression, no anxiety  Objective:    BP 106/71   Pulse 60   Ht 5\' 4"  (1.626 m)   Wt 205 lb 12.8 oz (93.4 kg)   BMI 35.33 kg/m   Wt Readings from Last 3 Encounters:  05/30/21 205 lb 12.8 oz (93.4 kg)  02/08/21 205 lb (93 kg)  11/25/20 205 lb 12.8 oz (93.4 kg)     BP Readings from Last 3 Encounters:  05/30/21 106/71  04/01/21 121/78  02/08/21 113/74     Physical Exam- Limited  Constitutional:  Body mass index is 35.33 kg/m. , not in acute distress, normal state of mind Eyes:  EOMI, no exophthalmos Neck: Supple Cardiovascular: RRR, no murmurs, rubs, or gallops, no edema Respiratory: Adequate breathing efforts, no crackles, rales, rhonchi, or wheezing Musculoskeletal: no gross deformities, strength intact in all four extremities, no gross restriction of joint movements Skin:  no rashes, no hyperemia Neurological: no  tremor with outstretched hands   Recent Results (from the past 2160 hour(s))  TSH     Status: Abnormal   Collection Time: 05/20/21  1:45 PM  Result Value Ref Range   TSH 0.204 (L) 0.450 - 4.500 uIU/mL  T4, free     Status: None   Collection Time: 05/20/21  1:45 PM  Result Value Ref Range   Free T4 1.27 0.82 - 1.77 ng/dL      Assessment & Plan:   1. Hypothyroidism due to Hashimoto's thyroiditis -Her previsit TFTs are consistent with appropriate hormone replacement.  She is advised to continue Levothyroxine 88 mcg po daily before breakfast.   - We discussed about the correct intake of her thyroid hormone, on empty stomach at fasting, with water, separated by at least 30 minutes from breakfast and other medications,  and separated by more than 4 hours from calcium, iron, multivitamins, acid reflux medications (PPIs). -Patient is made aware  of the fact that thyroid hormone replacement is needed for life, dose to be adjusted by periodic monitoring of thyroid function tests.  2. Pre-diabetes -Her POCT A1C today is 5.8%, essentially unchanged from previous visit.  Still will not require any pharmacological intervention at this time.  Dietary adjustments will be her main focus as she is limited with the exercises she can perform due to her MS diagnosis.  Continued weight loss will help.  - Nutritional counseling repeated at each appointment due to patients tendency to fall back in to old habits.  - The patient admits there is a room for improvement in their diet and drink choices. -  Suggestion is made for the patient to avoid simple carbohydrates from their diet including Cakes, Sweet Desserts / Pastries, Ice Cream, Soda (diet and regular), Sweet Tea, Candies, Chips, Cookies, Sweet Pastries, Store Bought Juices, Alcohol in Excess of 1-2 drinks a day, Artificial Sweeteners, Coffee Creamer, and "Sugar-free" Products. This will help patient to have stable blood glucose profile and potentially  avoid unintended weight gain.   - I encouraged the patient to switch to unprocessed or minimally processed complex starch and increased protein intake (animal or plant source), fruits, and vegetables.   - Patient is advised to stick to a routine mealtimes to eat 3 meals a day and avoid unnecessary snacks (to snack only to correct hypoglycemia).  3.  Vitamin D deficiency- Her most recent vitamin D level was 57.7 on 11/16/20.  She is advised to continue OTC vitamin D3 5000 units daily as maintenance dose until next measurement.   - I advised patient to maintain close follow up with Rosita Fire, MD for primary care needs.      I spent 20 minutes in the care of the patient today including review of labs from Thyroid Function, CMP, and other relevant labs ; imaging/biopsy records (current and previous including abstractions from other facilities); face-to-face time discussing  her lab results and symptoms, medications doses, her options of short and long term treatment based on the latest standards of care / guidelines;   and documenting the encounter.  Audelia Acton  participated in the discussions, expressed understanding, and voiced agreement with the above plans.  All questions were answered to her satisfaction. she is encouraged to contact clinic should she have any questions or concerns prior to her return visit.    Follow up plan: Return in about 6 months (around 11/27/2021) for Thyroid follow up, Previsit labs; prediabetes f/u.  Rayetta Pigg, [facility] [address] [place], [place] [address] Phone: [phone] Fax: [phone]  05/30/2021, 1:46 PM

## 2021-05-30 NOTE — Patient Instructions (Signed)

## 2021-06-20 ENCOUNTER — Other Ambulatory Visit (HOSPITAL_COMMUNITY): Payer: Self-pay | Admitting: Internal Medicine

## 2021-06-20 DIAGNOSIS — Z1231 Encounter for screening mammogram for malignant neoplasm of breast: Secondary | ICD-10-CM

## 2021-06-21 ENCOUNTER — Ambulatory Visit (HOSPITAL_COMMUNITY)
Admission: RE | Admit: 2021-06-21 | Discharge: 2021-06-21 | Disposition: A | Discharge status: Home / Self Care | Payer: Medicaid Other | Source: Ambulatory Visit | Attending: Gerontology | Admitting: Gerontology

## 2021-06-21 ENCOUNTER — Other Ambulatory Visit: Payer: Self-pay

## 2021-06-21 ENCOUNTER — Other Ambulatory Visit (HOSPITAL_COMMUNITY)
Admission: RE | Admit: 2021-06-21 | Discharge: 2021-06-21 | Disposition: A | Discharge status: Home / Self Care | Payer: Medicaid Other | Source: Ambulatory Visit | Attending: Internal Medicine | Admitting: Internal Medicine

## 2021-06-21 ENCOUNTER — Other Ambulatory Visit (HOSPITAL_COMMUNITY): Payer: Self-pay | Admitting: Gerontology

## 2021-06-21 DIAGNOSIS — I10 Essential (primary) hypertension: Secondary | ICD-10-CM | POA: Insufficient documentation

## 2021-06-21 DIAGNOSIS — M25562 Pain in left knee: Secondary | ICD-10-CM | POA: Diagnosis present

## 2021-06-21 DIAGNOSIS — R739 Hyperglycemia, unspecified: Secondary | ICD-10-CM | POA: Insufficient documentation

## 2021-06-21 DIAGNOSIS — E039 Hypothyroidism, unspecified: Secondary | ICD-10-CM | POA: Insufficient documentation

## 2021-06-21 DIAGNOSIS — Z0001 Encounter for general adult medical examination with abnormal findings: Secondary | ICD-10-CM | POA: Insufficient documentation

## 2021-06-21 LAB — CBC WITH DIFFERENTIAL/PLATELET
Abs Immature Granulocytes: 0.01 10*3/uL (ref 0.00–0.07)
Basophils Absolute: 0 10*3/uL (ref 0.0–0.1)
Basophils Relative: 1 %
Eosinophils Absolute: 0 10*3/uL (ref 0.0–0.5)
Eosinophils Relative: 1 %
HCT: 38.1 % (ref 36.0–46.0)
Hemoglobin: 12.3 g/dL (ref 12.0–15.0)
Immature Granulocytes: 0 %
Lymphocytes Relative: 23 %
Lymphs Abs: 0.9 10*3/uL (ref 0.7–4.0)
MCH: 29.7 pg (ref 26.0–34.0)
MCHC: 32.3 g/dL (ref 30.0–36.0)
MCV: 92 fL (ref 80.0–100.0)
Monocytes Absolute: 0.3 10*3/uL (ref 0.1–1.0)
Monocytes Relative: 7 %
Neutro Abs: 2.7 10*3/uL (ref 1.7–7.7)
Neutrophils Relative %: 68 %
Platelets: 201 10*3/uL (ref 150–400)
RBC: 4.14 MIL/uL (ref 3.87–5.11)
RDW: 13.3 % (ref 11.5–15.5)
WBC: 4 10*3/uL (ref 4.0–10.5)
nRBC: 0 % (ref 0.0–0.2)

## 2021-06-21 LAB — LIPID PANEL
Cholesterol: 234 mg/dL — ABNORMAL HIGH (ref 0–200)
HDL: 73 mg/dL (ref >40)
LDL Cholesterol: 148 mg/dL — ABNORMAL HIGH (ref 0–99)
Total CHOL/HDL Ratio: 3.2 RATIO
Triglycerides: 66 mg/dL (ref <150)
VLDL: 13 mg/dL (ref 0–40)

## 2021-06-21 LAB — COMPREHENSIVE METABOLIC PANEL
ALT: 9 U/L (ref 0–44)
AST: 14 U/L — ABNORMAL LOW (ref 15–41)
Albumin: 4.2 g/dL (ref 3.5–5.0)
Alkaline Phosphatase: 66 U/L (ref 38–126)
Anion gap: 7 (ref 5–15)
BUN: 18 mg/dL (ref 6–20)
CO2: 29 mmol/L (ref 22–32)
Calcium: 9.7 mg/dL (ref 8.9–10.3)
Chloride: 105 mmol/L (ref 98–111)
Creatinine, Ser: 0.75 mg/dL (ref 0.44–1.00)
GFR, Estimated: >60 mL/min (ref >60)
Glucose, Bld: 101 mg/dL — ABNORMAL HIGH (ref 70–99)
Potassium: 3.8 mmol/L (ref 3.5–5.1)
Sodium: 141 mmol/L (ref 135–145)
Total Bilirubin: 0.4 mg/dL (ref 0.3–1.2)
Total Protein: 7.7 g/dL (ref 6.5–8.1)

## 2021-06-21 LAB — T4, FREE: Free T4: 1 ng/dL (ref 0.61–1.12)

## 2021-06-21 LAB — HEMOGLOBIN A1C
Hgb A1c MFr Bld: 5.3 % (ref 4.8–5.6)
Mean Plasma Glucose: 105.41 mg/dL

## 2021-06-21 LAB — TSH: TSH: 0.629 u[IU]/mL (ref 0.350–4.500)

## 2021-07-28 ENCOUNTER — Other Ambulatory Visit: Payer: Self-pay

## 2021-07-28 ENCOUNTER — Ambulatory Visit (HOSPITAL_COMMUNITY)
Admission: RE | Admit: 2021-07-28 | Discharge: 2021-07-28 | Disposition: A | Discharge status: Home / Self Care | Payer: Medicaid Other | Source: Ambulatory Visit | Attending: Internal Medicine | Admitting: Internal Medicine

## 2021-07-28 DIAGNOSIS — Z1231 Encounter for screening mammogram for malignant neoplasm of breast: Secondary | ICD-10-CM | POA: Insufficient documentation

## 2021-08-11 ENCOUNTER — Ambulatory Visit: Payer: Medicaid Other | Admitting: Gastroenterology

## 2021-08-23 ENCOUNTER — Other Ambulatory Visit: Payer: Self-pay

## 2021-08-23 ENCOUNTER — Ambulatory Visit: Payer: Medicaid Other | Admitting: Orthopedic Surgery

## 2021-08-23 ENCOUNTER — Encounter: Payer: Self-pay | Admitting: Orthopedic Surgery

## 2021-08-23 VITALS — BP 130/84 | HR 71 | Ht 64.0 in | Wt 205.0 lb

## 2021-08-23 DIAGNOSIS — M25562 Pain in left knee: Secondary | ICD-10-CM

## 2021-08-23 NOTE — Patient Instructions (Addendum)
Instructions Following Joint Injections  In clinic today, you received an injection in one of your joints (sometimes more than one).  Occasionally, you can have some pain at the injection site, this is normal.  You can place ice at the injection site, or take over-the-counter medications such as Tylenol (acetaminophen) or Advil (ibuprofen).  Please follow all directions listed on the bottle.  If your joint (knee or shoulder) becomes swollen, red or very painful, please contact the clinic for additional assistance.   Two medications were injected, including lidocaine and a steroid (often referred to as cortisone).  Lidocaine is effective almost immediately but wears off quickly.  However, the steroid can take a few days to improve your symptoms.  In some cases, it can make your pain worse for a couple of days.  Do not be concerned if this happens as it is common.  You can apply ice or take some over-the-counter medications as needed.      Knee Exercises  Ask your health care provider which exercises are safe for you. Do exercises exactly as told by your health care provider and adjust them as directed. It is normal to feel mild stretching, pulling, tightness, or discomfort as you do these exercises. Stop right away if you feel sudden pain or your pain gets worse. Do not begin these exercises until told by your health care provider.  Stretching and range-of-motion exercises These exercises warm up your muscles and joints and improve the movement and flexibility of your knee. These exercises also help to relieve pain and swelling.  Knee extension, prone Lie on your abdomen (prone position) on a bed. Place your left / right knee just beyond the edge of the surface so your knee is not on the bed. You can put a towel under your left / right thigh just above your kneecap for comfort. Relax your leg muscles and allow gravity to straighten your knee (extension). You should feel a stretch behind your left  / right knee. Hold this position for 10 seconds. Scoot up so your knee is supported between repetitions. Repeat 10 times. Complete this exercise 3-4 times per week.     Knee flexion, active Lie on your back with both legs straight. If this causes back discomfort, bend your left / right knee so your foot is flat on the floor. Slowly slide your left / right heel back toward your buttocks. Stop when you feel a gentle stretch in the front of your knee or thigh (flexion). Hold this position for 10 seconds. Slowly slide your left / right heel back to the starting position. Repeat 10 times. Complete this exercise 3-4 times per week.      Quadriceps stretch, prone Lie on your abdomen on a firm surface, such as a bed or padded floor. Bend your left / right knee and hold your ankle. If you cannot reach your ankle or pant leg, loop a belt around your foot and grab the belt instead. Gently pull your heel toward your buttocks. Your knee should not slide out to the side. You should feel a stretch in the front of your thigh and knee (quadriceps). Hold this position for 10 seconds. Repeat 10 times. Complete this exercise 3-4 times per week.      Hamstring, supine Lie on your back (supine position). Loop a belt or towel over the ball of your left / right foot. The ball of your foot is on the walking surface, right under your toes. Straighten your left /   right knee and slowly pull on the belt to raise your leg until you feel a gentle stretch behind your knee (hamstring). Do not let your knee bend while you do this. Keep your other leg flat on the floor. Hold this position for 10 seconds. Repeat 10 times. Complete this exercise 3-4 times per week.   Strengthening exercises These exercises build strength and endurance in your knee. Endurance is the ability to use your muscles for a long time, even after they get tired.  Quadriceps, isometric This exercise stretches the muscles in front of your thigh  (quadriceps) without moving your knee joint (isometric). Lie on your back with your left / right leg extended and your other knee bent. Put a rolled towel or small pillow under your knee if told by your health care provider. Slowly tense the muscles in the front of your left / right thigh. You should see your kneecap slide up toward your hip or see increased dimpling just above the knee. This motion will push the back of the knee toward the floor. For 10 seconds, hold the muscle as tight as you can without increasing your pain. Relax the muscles slowly and completely. Repeat 10 times. Complete this exercise 3-4 times per week. .     Straight leg raises This exercise stretches the muscles in front of your thigh (quadriceps) and the muscles that move your hips (hip flexors). Lie on your back with your left / right leg extended and your other knee bent. Tense the muscles in the front of your left / right thigh. You should see your kneecap slide up or see increased dimpling just above the knee. Your thigh may even shake a bit. Keep these muscles tight as you raise your leg 4-6 inches (10-15 cm) off the floor. Do not let your knee bend. Hold this position for 10 seconds. Keep these muscles tense as you lower your leg. Relax your muscles slowly and completely after each repetition. Repeat 10 times. Complete this exercise 3-4 times per week.  Hamstring, isometric Lie on your back on a firm surface. Bend your left / right knee about 30 degrees. Dig your left / right heel into the surface as if you are trying to pull it toward your buttocks. Tighten the muscles in the back of your thighs (hamstring) to "dig" as hard as you can without increasing any pain. Hold this position for 10 seconds. Release the tension gradually and allow your muscles to relax completely for __________ seconds after each repetition. Repeat 10 times. Complete this exercise 3-4 times per week.  Hamstring curls If told by your  health care provider, do this exercise while wearing ankle weights. Begin with 5 lb weights. Then increase the weight by 1 lb (0.5 kg) increments. You can also use an exercise band Lie on your abdomen with your legs straight. Bend your left / right knee as far as you can without feeling pain. Keep your hips flat against the floor. Hold this position for 10 seconds. Slowly lower your leg to the starting position. Repeat 10 times. Complete this exercise 3-4 times per week.      Squats This exercise strengthens the muscles in front of your thigh and knee (quadriceps). Stand in front of a table, with your feet and knees pointing straight ahead. You may rest your hands on the table for balance but not for support. Slowly bend your knees and lower your hips like you are going to sit in a chair. Keep   your weight over your heels, not over your toes. Keep your lower legs upright so they are parallel with the table legs. Do not let your hips go lower than your knees. Do not bend lower than told by your health care provider. If your knee pain increases, do not bend as low. Hold the squat position for 10 seconds. Slowly push with your legs to return to standing. Do not use your hands to pull yourself to standing. Repeat 10 times. Complete this exercise 3-4 times per week .     Wall slides This exercise strengthens the muscles in front of your thigh and knee (quadriceps). Lean your back against a smooth wall or door, and walk your feet out 18-24 inches (46-61 cm) from it. Place your feet hip-width apart. Slowly slide down the wall or door until your knees bend 90 degrees. Keep your knees over your heels, not over your toes. Keep your knees in line with your hips. Hold this position for 10 seconds. Repeat 10 times. Complete this exercise 3-4 times per week.      Straight leg raises This exercise strengthens the muscles that rotate the leg at the hip and move it away from your body (hip  abductors). Lie on your side with your left / right leg in the top position. Lie so your head, shoulder, knee, and hip line up. You may bend your bottom knee to help you keep your balance. Roll your hips slightly forward so your hips are stacked directly over each other and your left / right knee is facing forward. Leading with your heel, lift your top leg 4-6 inches (10-15 cm). You should feel the muscles in your outer hip lifting. Do not let your foot drift forward. Do not let your knee roll toward the ceiling. Hold this position for 10 seconds. Slowly return your leg to the starting position. Let your muscles relax completely after each repetition. Repeat 10 times. Complete this exercise 3-4 times per week.      Straight leg raises This exercise stretches the muscles that move your hips away from the front of the pelvis (hip extensors). Lie on your abdomen on a firm surface. You can put a pillow under your hips if that is more comfortable. Tense the muscles in your buttocks and lift your left / right leg about 4-6 inches (10-15 cm). Keep your knee straight as you lift your leg. Hold this position for 10 seconds. Slowly lower your leg to the starting position. Let your leg relax completely after each repetition. Repeat 10 times. Complete this exercise 3-4 times per week.  

## 2021-08-23 NOTE — Progress Notes (Signed)
New Patient Visit  Assessment: Dawn Barnes is a 56 y.o. female with the following: 1. Acute pain of left knee  Plan: Patient has pain in the left knee.  Tenderness to palpation along the medial joint line.  Mild effusion on exam today.  Radiographs are essentially normal.  Mild loss of joint space within the medial compartment.  Onset of the pain is atraumatic.  Possible injury to the meniscus, although mechanism is not consistent.  She is tried over-the-counter medications as needed, and I have offered her injection.  She elected to proceed.  She will also try some home exercises.  If she has any further issues, we can try formal physical therapy.  Procedure note injection Left knee joint   Verbal consent was obtained to inject the left knee joint  Timeout was completed to confirm the site of injection.  The skin was prepped with alcohol and ethyl chloride was sprayed at the injection site.  A 21-gauge needle was used to inject 40 mg of Depo-Medrol and 1% lidocaine (3 cc) into the left knee using an anterolateral approach.  There were no complications. A sterile bandage was applied.      Follow-up: Return if symptoms worsen or fail to improve.  Subjective:  Chief Complaint  Patient presents with   Knee Pain    Lt knee pain for 2 months. NKI    History of Present Illness: Dawn Barnes is a 56 y.o. female who presents for evaluation of left knee pain.  Pain started approximately 2 months ago.  No specific injury.  She states she may have sustained a twisting injury.  She thinks her left knee is swollen.  She has been taken some Tylenol.  She cannot take ibuprofen.  No prior injections.  She has not done physical therapy for her left knee.   Review of Systems: No fevers or chills No numbness or tingling No chest pain No shortness of breath No bowel or bladder dysfunction No GI distress No headaches   Medical History:  Past Medical History:  Diagnosis Date   Anxiety     Constipation 04/30/2013   Depression    Diabetes mellitus without complication (HCC)    borderline   Fibroids, intramural 01/21/2016   GERD (gastroesophageal reflux disease)    Heartburn    Hot flashes 01/11/2016   Mixed incontinence    MS (multiple sclerosis) (HCC)    Pancreatitis    PONV (postoperative nausea and vomiting)    PTSD (post-traumatic stress disorder)    Thyroid disease    Urge urinary incontinence 05/10/2015   Vaginal bleeding 01/11/2016    Past Surgical History:  Procedure Laterality Date   BIOPSY  12/07/2015   Procedure: BIOPSY;  Surgeon: Danie Binder, MD;  Location: [facility];  Service: Endoscopy;;  right and left colon biopsies, gastric bx's    COLONOSCOPY WITH PROPOFOL N/A 12/07/2015   Dr. Oneida Alar: mild proctitis, hyperplastic polyp    ENDOMETRIAL ABLATION     ESOPHAGOGASTRODUODENOSCOPY (EGD) WITH PROPOFOL N/A 12/07/2015   Dr. 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    INCONTINENCE SURGERY     SAVORY DILATION N/A 12/07/2015   Procedure: SAVORY DILATION;  Surgeon: Danie Binder, MD;  Location: [facility];  Service: Endoscopy;  Laterality: N/A;   TUBAL LIGATION      Family History  Problem Relation Age of Onset   Diabetes Father    Heart disease Father  Hypertension Paternal Aunt    Heart disease Paternal Aunt    Seizures Paternal 21    Breast cancer Paternal Aunt    Heart disease Paternal Grandmother    Depression Daughter    Hypertension Sister    Heart disease Sister    Depression Son    ADD / ADHD Son    Depression Daughter    Colon cancer Neg Hx    Colon polyps Neg Hx    Social History   Tobacco Use   Smoking status: Never   Smokeless tobacco: Never  Vaping Use   Vaping Use: Never used  Substance Use Topics   Alcohol use: No    Alcohol/week: 0.0 standard drinks   Drug use: No    Allergies  Allergen Reactions   Ibuprofen Other (See Comments)   Nsaids Other (See Comments)     Pt states MD told her not to take due to stomach irritation   Tuberculin Tests Dermatitis    Current Meds  Medication Sig   acetaminophen (TYLENOL) 325 MG tablet Take 650 mg by mouth as needed.    cetirizine (ZYRTEC) 10 MG tablet Take 10 mg by mouth daily.   Cholecalciferol (VITAMIN D3) 125 MCG (5000 UT) TABS Take 1 tablet by mouth daily at 12 noon.   Dimethyl Fumarate 240 MG CPDR Take 240 mg by mouth 2 (two) times daily.   escitalopram (LEXAPRO) 20 MG tablet Take 20 mg by mouth daily.   gabapentin (NEURONTIN) 600 MG tablet Take 600 mg by mouth 2 (two) times daily.   levothyroxine (SYNTHROID) 88 MCG tablet Take 1 tablet (88 mcg total) by mouth daily before breakfast.   LINZESS 290 MCG CAPS capsule TAKE 1 CAPSULE BY MOUTH DAILY BEFORE BREAKFAST.   meclizine (ANTIVERT) 25 MG tablet Take 25 mg by mouth 3 (three) times daily as needed.   methocarbamol (ROBAXIN) 500 MG tablet Take 500 mg by mouth 2 (two) times daily.   omeprazole (PRILOSEC) 20 MG capsule Take 20 mg by mouth daily.    TOVIAZ 8 MG TB24 tablet Take 8 mg by mouth daily.   traMADol (ULTRAM) 50 MG tablet tramadol 50 mg tablet   UNABLE TO FIND every 3 (three) months. Vit B 12 injection-every 3 months    Objective: BP 130/84    Pulse 71    Ht 5\' 4"  (1.626 m)    Wt 205 lb (93 kg)    BMI 35.19 kg/m   Physical Exam:  General: Alert and oriented. and No acute distress. Gait: Left sided antalgic gait.  Evaluation left knee demonstrates a mild effusion.  Tender to palpation along the medial joint line.  She is able to achieve full extension.  Tolerates flexion beyond 110 degrees.  Negative Lachman.  No increased laxity to varus or valgus stress.  Sensations intact over the dorsum of her foot  IMAGING: I personally reviewed images previously obtained from the ED   X-ray of the left knee was previously obtained in the emergency department.  No acute injuries are noted.  Mild loss of joint space within the medial compartment.   Otherwise negative.  New Medications:  No orders of the defined types were placed in this encounter.     Mordecai Rasmussen, MD  08/23/2021 11:42 AM

## 2021-09-21 ENCOUNTER — Ambulatory Visit: Payer: Medicaid Other | Admitting: Orthopedic Surgery

## 2021-09-21 ENCOUNTER — Other Ambulatory Visit: Payer: Self-pay

## 2021-09-21 ENCOUNTER — Encounter: Payer: Self-pay | Admitting: Orthopedic Surgery

## 2021-09-21 VITALS — Ht 64.0 in | Wt 205.0 lb

## 2021-09-21 DIAGNOSIS — G8929 Other chronic pain: Secondary | ICD-10-CM

## 2021-09-21 DIAGNOSIS — M25562 Pain in left knee: Secondary | ICD-10-CM | POA: Diagnosis not present

## 2021-09-21 NOTE — Patient Instructions (Signed)
Call the clinic to schedule a follow up appointment once your left knee MRI has been scheduled.

## 2021-09-21 NOTE — Progress Notes (Signed)
New Patient Visit  Assessment: Dawn Barnes is a 56 y.o. female with the following: 1. Acute pain of left knee  Plan: Injected improved her symptoms for approximately 2 weeks.  Since then, she has noted worsening swelling in her left knee.  Her pain is worse than it was prior to the injection.  Home exercises have not improved her symptoms.  She has tenderness to palpation over the medial joint line.  She also has worsening pain with hyperflexion.  At this point, I am concerned about a meniscus injury in the medial compartment.  As result, I am recommending an MRI of the left knee.    Follow-up: Return for After MRI.  Subjective:  Chief Complaint  Patient presents with   Knee Pain    Lt knee pain back and worse than before. Pt states shot lasted 2 wks.     History of Present Illness: Dawn Barnes is a 56 y.o. female who presents for repeat evaluation of left knee pain.  She was seen in clinic approximately 1 month ago.  At that time, she received a steroid injection in her knee.  She states that it improved her symptoms for approximately 2 weeks.  However since then, her pain has progressively worsened.  The pain is currently worse than it was prior to the injection.  She has also noticed worsening swelling in her left knee.  She has been doing the home exercises much as tolerated.   Review of Systems: No fevers or chills No numbness or tingling No chest pain No shortness of breath No bowel or bladder dysfunction No GI distress No headaches   Objective: Ht 5\' 4"  (1.626 m)    Wt 205 lb (93 kg)    BMI 35.19 kg/m   Physical Exam:  General: Alert and oriented. and No acute distress. Gait: Left sided antalgic gait.  Evaluation left knee demonstrates a moderate effusion.  She has some difficulty achieving full extension.  Tenderness to palpation along the medial joint line.  She has pain over the medial joint line, with hyperflexion beyond 120 degrees.  No increased laxity  varus or valgus stress.  Negative Lachman.  IMAGING: I personally reviewed images previously obtained from the ED  No new imaging obtained today.  New Medications:  No orders of the defined types were placed in this encounter.     Mordecai Rasmussen, MD  09/21/2021 10:04 AM

## 2021-10-06 ENCOUNTER — Telehealth: Payer: Self-pay | Admitting: Orthopedic Surgery

## 2021-10-06 ENCOUNTER — Other Ambulatory Visit: Payer: Self-pay

## 2021-10-06 ENCOUNTER — Ambulatory Visit (HOSPITAL_COMMUNITY)
Admission: RE | Admit: 2021-10-06 | Discharge: 2021-10-06 | Disposition: A | Discharge status: Home / Self Care | Payer: Medicaid Other | Source: Ambulatory Visit | Attending: Orthopedic Surgery | Admitting: Orthopedic Surgery

## 2021-10-06 DIAGNOSIS — G8929 Other chronic pain: Secondary | ICD-10-CM | POA: Diagnosis present

## 2021-10-06 DIAGNOSIS — M25562 Pain in left knee: Secondary | ICD-10-CM | POA: Diagnosis not present

## 2021-10-06 NOTE — Telephone Encounter (Signed)
Message sent to Dr Amedeo Kinsman to review.  ?

## 2021-10-06 NOTE — Telephone Encounter (Signed)
Call received via Voice message from [place] at [facility], ph#[phone]; states needs to call in MRI result. ?

## 2021-10-11 ENCOUNTER — Other Ambulatory Visit: Payer: Self-pay

## 2021-10-11 ENCOUNTER — Ambulatory Visit (INDEPENDENT_AMBULATORY_CARE_PROVIDER_SITE_OTHER): Payer: Medicaid Other | Admitting: Orthopedic Surgery

## 2021-10-11 ENCOUNTER — Encounter: Payer: Self-pay | Admitting: Orthopedic Surgery

## 2021-10-11 DIAGNOSIS — S838X2D Sprain of other specified parts of left knee, subsequent encounter: Secondary | ICD-10-CM | POA: Diagnosis not present

## 2021-10-12 ENCOUNTER — Encounter: Payer: Self-pay | Admitting: Orthopedic Surgery

## 2021-10-12 NOTE — Progress Notes (Signed)
New Patient Visit ? ?Assessment: ?Dawn Barnes is a 56 y.o. female with the following: ?1. Acute pain of left knee; medial meniscus tear, with medial tibial plateau insufficiency fracture ? ?Plan: ?Reviewed the MRI with Ms. Hurlock in clinic today.  She does have a medial meniscus tear, with extensive bony edema within the medial tibial plateau.  She states that the pain is progressively improving.  She may ultimately need surgery to address the meniscus injury, but I would like for the bony edema to continue to improve before further consideration.  As a result, I provided her with a prescription for a walker, and she was fitted for a brace in clinic today.  I would like her to continue to use the walker to help offload the left knee.  We will plan to reconvene in approximately 2 weeks for further evaluation, and discussion about further treatment. ? ? ? ?Follow-up: ?Return in about 2 weeks (around 10/25/2021). ? ?Subjective: ? ?Chief Complaint  ?Patient presents with  ? Knee Pain  ?  Left knee f/u review MRI.   ? ? ?History of Present Illness: ?Dawn Barnes is a 56 y.o. female who presents for repeat evaluation of left knee pain.  She has had pain in the left knee for couple of months.  An injection improved some of her pain.  However, she continued to have issues in the medial aspect of the left knee.  She has obtained an MRI, and is here to discuss the results.  She states that she has noticed less swelling in the knee recently.  She thinks the knee is slowly improving.  No additional injuries at this time. ? ?Review of Systems: ?No fevers or chills ?No numbness or tingling ?No chest pain ?No shortness of breath ?No bowel or bladder dysfunction ?No GI distress ?No headaches ? ? ?Objective: ?There were no vitals taken for this visit. ? ?Physical Exam: ? ?General: Alert and oriented. and No acute distress. ?Gait: Left sided antalgic gait. ? ?Evaluation left knee demonstrates a mild effusion.  Able to achieve full  extension. Tenderness to palpation along the medial joint line, especially the medial tibial plateau.  She has pain over the medial joint line, with hyperflexion beyond 120 degrees.  No increased laxity varus or valgus stress.  Negative Lachman.  No bruising, or redness within the popliteal fossa or posterior leg.  Mild tenderness to palpation within the popliteal fossa.  Baker's cyst is palpable. ? ?IMAGING: ?I personally reviewed images previously obtained from the ED ? ?Left knee MRI ? ?IMPRESSION: ?1. Subchondral stress/insufficiency fracture of the medial tibial ?plateau with severe subchondral bone marrow edema. ?2. Radial tear of the posterior horn of the medial meniscus with ?peripheral meniscal extrusion. ?3. Mild partial-thickness cartilage loss of the medial femorotibial ?compartment with subchondral reactive marrow edema. ?4. Large leaking Baker's cyst. ?  ? ?New Medications:  ?No orders of the defined types were placed in this encounter. ? ? ? ? ?Mordecai Rasmussen, MD ? ?10/12/2021 ?8:06 AM ? ? ?

## 2021-10-26 ENCOUNTER — Ambulatory Visit (INDEPENDENT_AMBULATORY_CARE_PROVIDER_SITE_OTHER): Payer: Medicaid Other | Admitting: Orthopedic Surgery

## 2021-10-26 ENCOUNTER — Encounter: Payer: Self-pay | Admitting: Orthopedic Surgery

## 2021-10-26 ENCOUNTER — Other Ambulatory Visit: Payer: Self-pay

## 2021-10-26 VITALS — Ht 64.0 in | Wt 205.0 lb

## 2021-10-26 DIAGNOSIS — S838X2D Sprain of other specified parts of left knee, subsequent encounter: Secondary | ICD-10-CM

## 2021-10-26 DIAGNOSIS — Z01818 Encounter for other preprocedural examination: Secondary | ICD-10-CM | POA: Diagnosis not present

## 2021-10-26 NOTE — Patient Instructions (Signed)
Plan for surgery 4/20 ? ?Use walker to assist with ambulation ? ?Medicines as needed until surgery ?

## 2021-10-27 NOTE — H&P (View-Only) (Signed)
New Patient Visit ? ?Assessment: ?Dawn Barnes is a 56 y.o. female with the following: ?1. Acute pain of left knee; medial meniscus tear, with medial tibial plateau insufficiency fracture ? ?Plan: ?Dawn Barnes continues to have pain in the left knee.  Over the past couple weeks, she has noticed limited improvement in her symptoms.  We reviewed the results of the MRI in clinic today.  She is complaining of pain in the posterior left knee, as well as the medial joint line and proximal medial tibial plateau.  I explicitly stated that we cannot address the pain in the posterior aspect of the knee, or the pain in the medial tibial plateau.  She stated understanding.  I offered her an ultrasound-guided aspiration and injection of the Baker's cyst, but she has deferred for now.  Given the chronicity of her symptoms, and the lack of improvement over the last couple of weeks, she is interested in surgery.  We will plan to proceed with a left knee arthroscopy, with partial medial meniscectomy, scheduled for April 20.  All questions were answered, and she is amenable to this plan. ? ? ?Follow-up: ?Return for After surgery; DOS 11/17/21. ? ?Subjective: ? ?Chief Complaint  ?Patient presents with  ? Knee Pain  ?  Lt knee pain   ? ? ?History of Present Illness: ?Dawn Barnes is a 56 y.o. female who presents for repeat evaluation of left knee pain.  She was last seen in clinic a couple of weeks ago.  Since then, the pain has remained roughly the same.  She has occasional swelling in her left knee.  She has tried wearing a brace, but this would not stay in position.  In addition, she uses a walker most of the time, but is not using it upon presentation to clinic today. ? ? ?Review of Systems: ?No fevers or chills ?No numbness or tingling ?No chest pain ?No shortness of breath ?No bowel or bladder dysfunction ?No GI distress ?No headaches ? ? ?Objective: ?Ht '5\' 4"'$  (1.626 m)   Wt 205 lb (93 kg)   BMI 35.19 kg/m?  ? ?Physical  Exam: ? ?General: Alert and oriented. and No acute distress. ?Gait: Left sided antalgic gait. ? ?Evaluation left knee demonstrates a mild effusion.  Able to achieve full extension. Tenderness to palpation along the medial joint line, especially the medial tibial plateau.  She has pain over the medial joint line, with hyperflexion beyond 120 degrees.  No increased laxity varus or valgus stress.  Negative Lachman.  No bruising, or redness within the popliteal fossa or posterior leg.  Mild tenderness to palpation within the popliteal fossa.  Baker's cyst is palpable. ? ?IMAGING: ?I personally reviewed images previously obtained from the ED ? ?Left knee MRI ? ?IMPRESSION: ?1. Subchondral stress/insufficiency fracture of the medial tibial ?plateau with severe subchondral bone marrow edema. ?2. Radial tear of the posterior horn of the medial meniscus with ?peripheral meniscal extrusion. ?3. Mild partial-thickness cartilage loss of the medial femorotibial ?compartment with subchondral reactive marrow edema. ?4. Large leaking Baker's cyst. ?  ? ?New Medications:  ?No orders of the defined types were placed in this encounter. ? ? ? ? ?Mordecai Rasmussen, MD ? ?10/27/2021 ?12:59 PM ? ? ?

## 2021-10-27 NOTE — Progress Notes (Signed)
New Patient Visit ? ?Assessment: ?Dawn Barnes is a 56 y.o. female with the following: ?1. Acute pain of left knee; medial meniscus tear, with medial tibial plateau insufficiency fracture ? ?Plan: ?Dawn Barnes continues to have pain in the left knee.  Over the past couple weeks, she has noticed limited improvement in her symptoms.  We reviewed the results of the MRI in clinic today.  She is complaining of pain in the posterior left knee, as well as the medial joint line and proximal medial tibial plateau.  I explicitly stated that we cannot address the pain in the posterior aspect of the knee, or the pain in the medial tibial plateau.  She stated understanding.  I offered her an ultrasound-guided aspiration and injection of the Baker's cyst, but she has deferred for now.  Given the chronicity of her symptoms, and the lack of improvement over the last couple of weeks, she is interested in surgery.  We will plan to proceed with a left knee arthroscopy, with partial medial meniscectomy, scheduled for April 20.  All questions were answered, and she is amenable to this plan. ? ? ?Follow-up: ?Return for After surgery; DOS 11/17/21. ? ?Subjective: ? ?Chief Complaint  ?Patient presents with  ? Knee Pain  ?  Lt knee pain   ? ? ?History of Present Illness: ?Dawn Barnes is a 57 y.o. female who presents for repeat evaluation of left knee pain.  She was last seen in clinic a couple of weeks ago.  Since then, the pain has remained roughly the same.  She has occasional swelling in her left knee.  She has tried wearing a brace, but this would not stay in position.  In addition, she uses a walker most of the time, but is not using it upon presentation to clinic today. ? ? ?Review of Systems: ?No fevers or chills ?No numbness or tingling ?No chest pain ?No shortness of breath ?No bowel or bladder dysfunction ?No GI distress ?No headaches ? ? ?Objective: ?Ht '5\' 4"'$  (1.626 m)   Wt 205 lb (93 kg)   BMI 35.19 kg/m?  ? ?Physical  Exam: ? ?General: Alert and oriented. and No acute distress. ?Gait: Left sided antalgic gait. ? ?Evaluation left knee demonstrates a mild effusion.  Able to achieve full extension. Tenderness to palpation along the medial joint line, especially the medial tibial plateau.  She has pain over the medial joint line, with hyperflexion beyond 120 degrees.  No increased laxity varus or valgus stress.  Negative Lachman.  No bruising, or redness within the popliteal fossa or posterior leg.  Mild tenderness to palpation within the popliteal fossa.  Baker's cyst is palpable. ? ?IMAGING: ?I personally reviewed images previously obtained from the ED ? ?Left knee MRI ? ?IMPRESSION: ?1. Subchondral stress/insufficiency fracture of the medial tibial ?plateau with severe subchondral bone marrow edema. ?2. Radial tear of the posterior horn of the medial meniscus with ?peripheral meniscal extrusion. ?3. Mild partial-thickness cartilage loss of the medial femorotibial ?compartment with subchondral reactive marrow edema. ?4. Large leaking Baker's cyst. ?  ? ?New Medications:  ?No orders of the defined types were placed in this encounter. ? ? ? ? ?Mordecai Rasmussen, MD ? ?10/27/2021 ?12:59 PM ? ? ?

## 2021-10-31 ENCOUNTER — Other Ambulatory Visit: Payer: Self-pay | Admitting: Gastroenterology

## 2021-11-02 ENCOUNTER — Other Ambulatory Visit: Payer: Self-pay

## 2021-11-02 DIAGNOSIS — S838X2D Sprain of other specified parts of left knee, subsequent encounter: Secondary | ICD-10-CM

## 2021-11-03 NOTE — Telephone Encounter (Signed)
Noted  

## 2021-11-10 NOTE — Patient Instructions (Addendum)
? ? ? ? ? ? ? Dawn Barnes ? 11/10/2021  ?  ? '@PREFPERIOPPHARMACY'$ @ ? ? Your procedure is scheduled on  11/17/2021. ? ? Report to [facility] at  0600 A.M. ? ? Call this number if you have problems the morning of surgery: ? [phone] ? ? Remember: ? Do not eat after midnight. ? ? You may drink clear liquids until 0330 AM on 11/17/2021. ? ? ?Clear liquids allowed are:                    Water, Juice (non-citric and without pulp - diabetics please choose diet or no sugar options), Carbonated beverages - (diabetics please choose diet or no sugar options), Clear Tea, Black Coffee only (no creamer, milk or cream including half and half), Plain Jell-O only (diabetics please choose diet or no sugar options), Gatorade (diabetics please choose diet or no sugar options), and Plain Popsicles only ? ?At 0330 AM on 11/17/2021, drink your carb drink. You can have nothing else to drink after this. ?  ? ? Take these medicines the morning of surgery with A SIP OF WATER  ? ?zyrtec, dimethyl furmarate, gabapentin, levothyroxine, antivert(if needed), robaxin(if needed), prilosec, tramadol(if needed) ?  ? Do not wear jewelry, make-up or nail polish. ? Do not wear lotions, powders, or perfumes, or deodorant. ? Do not shave 48 hours prior to surgery.  Men may shave face and neck. ? Do not bring valuables to the hospital. ? [facility] is not responsible for any belongings or valuables. ? ?Contacts, dentures or bridgework may not be worn into surgery.  Leave your suitcase in the car.  After surgery it may be brought to your room. ? ?For patients admitted to the hospital, discharge time will be determined by your treatment team. ? ?Patients discharged the day of surgery will not be allowed to drive home and must have someone with them for 24 hours.  ? ? ?Special instructions:   DO NOT smoke tobacco or vape for 24 hours before your procedure. ? ?Please read over the following fact sheets that you were given. ?Coughing and Deep Breathing,  Surgical Site Infection Prevention, Anesthesia Post-op Instructions, and Care and Recovery After Surgery ? ?  ? ? ? Arthroscopic Knee Ligament Repair, Care After ?This sheet gives you information about how to care for yourself after your procedure. Your health care provider may also give you more specific instructions. If you have problems or questions, contact your health care provider. ?What can I expect after the procedure? ?After the procedure, it is common to have: ?Soreness or pain in your knee. ?Bruising and swelling on your knee, calf, and ankle for 3-4 days. ?A small amount of fluid coming from the incisions. ?Follow these instructions at home: ?Medicines ?Take over-the-counter and prescription medicines only as told by your health care provider. ?Ask your health care provider if the medicine prescribed to you: ?Requires you to avoid driving or using machinery. ?Can cause constipation. You may need to take these actions to prevent or treat constipation: ?Drink enough fluid to keep your urine pale yellow. ?Take over-the-counter or prescription medicines. ?Eat foods that are high in fiber, such as beans, whole grains, and fresh fruits and vegetables. ?Limit foods that are high in fat and processed sugars, such as fried or sweet foods. ?If you have a brace or immobilizer: ?Wear it as told by your health care provider. Remove it only as told by your health care provider. ?Loosen it  if your toes tingle, become numb, or turn cold and blue. ?Keep it clean and dry. ?Ask your health care provider when it is safe to drive. ?Bathing ?Do not take baths, swim, or use a hot tub until your health care provider approves. ?Keep your bandage (dressing) dry until your health care provider says that it can be removed. ?If the brace or immobilizer is not waterproof: ?Do not let it get wet. ?Cover it with a watertight covering when you take a bath or shower. ?Incision care ? ?Follow instructions from your health care provider  about how to take care of your incisions. Make sure you: ?Wash your hands with soap and water for at least 20 seconds before and after you change your dressing. If soap and water are not available, use hand sanitizer. ?Change your dressing as told by your health care provider. ?Leave stitches (sutures), skin glue, or adhesive strips in place. These skin closures may need to stay in place for 2 weeks or longer. If adhesive strip edges start to loosen and curl up, you may trim the loose edges. Do not remove adhesive strips completely unless your health care provider tells you to do that. ?Check your incision areas every day for signs of infection. Check for: ?Redness. ?More swelling or pain. ?Blood or more fluid. ?Warmth. ?Pus or a bad smell. ?Managing pain, stiffness, and swelling ? ?If directed, put ice on the affected area. To do this: ?If you have a removable brace or immobilizer, remove it as told by your health care provider. ?Put ice in a plastic bag. ?Place a towel between your skin and the bag. ?Leave the ice on for 20 minutes, 2-3 times a day. ?Remove the ice if your skin turns bright red. This is very important. If you cannot feel pain, heat, or cold, you have a greater risk of damage to the area. ?Move your toes often to reduce stiffness and swelling. ?Raise (elevate) the injured area above the level of your heart while you are sitting or lying down. ?Activity ?Do not use your knee to support your body weight until your health care provider says that you can. Use crutches or other devices as told by your health care provider. ?Do physical therapy exercises as told by your health care provider. Physical therapy will help you regain movement and strength in your knee. ?Follow instructions from your health care provider about: ?When you may start motion exercises. ?When you may start riding a stationary bike and doing other low-impact activities. ?When you may start to jog and do other high-impact  activities. ?Do not lift anything that is heavier than 10 lb (4.5 kg), or the limit that you are told, until your health care provider says that it is safe. ?Ask your health care provider what activities are safe for you. ?General instructions ?Do not use any products that contain nicotine or tobacco, such as cigarettes, e-cigarettes, and chewing tobacco. These can delay healing. If you need help quitting, ask your health care provider. ?Wear compression stockings as told by your health care provider. These stockings help to prevent blood clots and reduce swelling in your legs. ?Keep all follow-up visits. This is important. ?Contact a health care provider if: ?You have any of these signs of infection: ?Redness around an incision. ?Blood or more fluid coming from an incision. ?Warmth coming from an incision. ?Pus or a bad smell coming from an incision. ?More swelling or pain in your knee. ?A fever or chills. ?You have pain  that does not get better with medicine. ?Your incision opens up. ?Get help right away if: ?You have trouble breathing. ?You have chest pain. ?You have increased pain or swelling in your calf or at the back of your knee. ?You have numbness and tingling near the knee joint or in the foot, ankle, or toes. ?You notice that your foot or toes look darker than normal or are cooler than normal. ?These symptoms may represent a serious problem that is an emergency. Do not wait to see if the symptoms will go away. Get medical help right away. Call your local emergency services (911 in the [place] Do not drive yourself to the hospital. ?Summary ?After the procedure, it is common to have knee pain with bruising and swelling on your knee, calf, and ankle. ?Icing your knee and raising your leg above the level of your heart will help control the pain and swelling. ?Do physical therapy exercises as told by your health care provider. Physical therapy will help you regain movement and strength in your knee. ?This  information is not intended to replace advice given to you by your health care provider. Make sure you discuss any questions you have with your health care provider. ?Document Revised: 12/15/2019 Document Reviewed: 05/17/2

## 2021-11-14 ENCOUNTER — Encounter (HOSPITAL_COMMUNITY)
Admission: RE | Admit: 2021-11-14 | Discharge: 2021-11-14 | Disposition: A | Discharge status: Home / Self Care | Payer: Medicaid Other | Source: Ambulatory Visit | Attending: Orthopedic Surgery | Admitting: Orthopedic Surgery

## 2021-11-14 ENCOUNTER — Encounter (HOSPITAL_COMMUNITY): Payer: Self-pay

## 2021-11-14 VITALS — BP 128/83 | HR 91 | Temp 97.5°F | Resp 18 | Ht 64.0 in | Wt 205.0 lb

## 2021-11-14 DIAGNOSIS — Z01818 Encounter for other preprocedural examination: Secondary | ICD-10-CM | POA: Insufficient documentation

## 2021-11-14 DIAGNOSIS — R7303 Prediabetes: Secondary | ICD-10-CM | POA: Diagnosis not present

## 2021-11-14 HISTORY — DX: Anemia, unspecified: D64.9

## 2021-11-14 HISTORY — DX: Hypothyroidism, unspecified: E03.9

## 2021-11-14 LAB — BASIC METABOLIC PANEL
Anion gap: 8 (ref 5–15)
BUN: 14 mg/dL (ref 6–20)
CO2: 27 mmol/L (ref 22–32)
Calcium: 9.6 mg/dL (ref 8.9–10.3)
Chloride: 105 mmol/L (ref 98–111)
Creatinine, Ser: 0.71 mg/dL (ref 0.44–1.00)
GFR, Estimated: >60 mL/min (ref >60)
Glucose, Bld: 80 mg/dL (ref 70–99)
Potassium: 3.7 mmol/L (ref 3.5–5.1)
Sodium: 140 mmol/L (ref 135–145)

## 2021-11-14 LAB — CBC
HCT: 37.2 % (ref 36.0–46.0)
Hemoglobin: 12 g/dL (ref 12.0–15.0)
MCH: 29.4 pg (ref 26.0–34.0)
MCHC: 32.3 g/dL (ref 30.0–36.0)
MCV: 91.2 fL (ref 80.0–100.0)
Platelets: 232 10*3/uL (ref 150–400)
RBC: 4.08 MIL/uL (ref 3.87–5.11)
RDW: 13.2 % (ref 11.5–15.5)
WBC: 3.3 10*3/uL — ABNORMAL LOW (ref 4.0–10.5)
nRBC: 0 % (ref 0.0–0.2)

## 2021-11-14 LAB — HCG, QUANTITATIVE, PREGNANCY: hCG, Beta Chain, Quant, S: 4 m[IU]/mL (ref <5)

## 2021-11-14 LAB — HEMOGLOBIN A1C
Hgb A1c MFr Bld: 5.6 % (ref 4.8–5.6)
Mean Plasma Glucose: 114.02 mg/dL

## 2021-11-17 ENCOUNTER — Encounter (HOSPITAL_COMMUNITY): Payer: Self-pay | Admitting: Orthopedic Surgery

## 2021-11-17 ENCOUNTER — Ambulatory Visit (HOSPITAL_COMMUNITY)
Admission: RE | Admit: 2021-11-17 | Discharge: 2021-11-17 | Disposition: A | Discharge status: Home / Self Care | Payer: Medicaid Other | Attending: Orthopedic Surgery | Admitting: Orthopedic Surgery

## 2021-11-17 ENCOUNTER — Encounter: Payer: Self-pay | Admitting: Orthopedic Surgery

## 2021-11-17 ENCOUNTER — Ambulatory Visit (HOSPITAL_BASED_OUTPATIENT_CLINIC_OR_DEPARTMENT_OTHER): Payer: Medicaid Other | Admitting: Certified Registered Nurse Anesthetist

## 2021-11-17 ENCOUNTER — Encounter (HOSPITAL_COMMUNITY)
Admission: RE | Disposition: A | Discharge status: Home / Self Care | Payer: Self-pay | Source: Home / Self Care | Attending: Orthopedic Surgery

## 2021-11-17 ENCOUNTER — Other Ambulatory Visit: Payer: Self-pay

## 2021-11-17 ENCOUNTER — Ambulatory Visit (HOSPITAL_COMMUNITY): Payer: Medicaid Other | Admitting: Certified Registered Nurse Anesthetist

## 2021-11-17 DIAGNOSIS — S83242A Other tear of medial meniscus, current injury, left knee, initial encounter: Secondary | ICD-10-CM | POA: Insufficient documentation

## 2021-11-17 DIAGNOSIS — M7122 Synovial cyst of popliteal space [Baker], left knee: Secondary | ICD-10-CM | POA: Insufficient documentation

## 2021-11-17 DIAGNOSIS — X58XXXA Exposure to other specified factors, initial encounter: Secondary | ICD-10-CM | POA: Diagnosis not present

## 2021-11-17 DIAGNOSIS — E039 Hypothyroidism, unspecified: Secondary | ICD-10-CM

## 2021-11-17 DIAGNOSIS — E119 Type 2 diabetes mellitus without complications: Secondary | ICD-10-CM | POA: Insufficient documentation

## 2021-11-17 DIAGNOSIS — F418 Other specified anxiety disorders: Secondary | ICD-10-CM

## 2021-11-17 DIAGNOSIS — K219 Gastro-esophageal reflux disease without esophagitis: Secondary | ICD-10-CM | POA: Diagnosis not present

## 2021-11-17 DIAGNOSIS — S83242D Other tear of medial meniscus, current injury, left knee, subsequent encounter: Secondary | ICD-10-CM | POA: Diagnosis not present

## 2021-11-17 HISTORY — PX: KNEE ARTHROSCOPY WITH MEDIAL MENISECTOMY: SHX5651

## 2021-11-17 SURGERY — ARTHROSCOPY, KNEE, WITH MEDIAL MENISCECTOMY
Anesthesia: General | Site: Knee | Laterality: Left

## 2021-11-17 MED ORDER — CEFAZOLIN SODIUM-DEXTROSE 2-4 GM/100ML-% IV SOLN
2.0000 g | INTRAVENOUS | Status: AC
  Administered 2021-11-17: 2 g via INTRAVENOUS

## 2021-11-17 MED ORDER — BUPIVACAINE-EPINEPHRINE (PF) 0.5% -1:200000 IJ SOLN
INTRAMUSCULAR | Status: DC | PRN
  Administered 2021-11-17: 30 mL via PERINEURAL

## 2021-11-17 MED ORDER — LIDOCAINE HCL (PF) 2 % IJ SOLN
INTRAMUSCULAR | Status: AC
  Filled 2021-11-17: qty 5

## 2021-11-17 MED ORDER — LIDOCAINE HCL (CARDIAC) PF 100 MG/5ML IV SOSY
PREFILLED_SYRINGE | INTRAVENOUS | Status: DC | PRN
  Administered 2021-11-17: 60 mg via INTRAVENOUS

## 2021-11-17 MED ORDER — CEFAZOLIN SODIUM-DEXTROSE 2-4 GM/100ML-% IV SOLN
INTRAVENOUS | Status: AC
  Filled 2021-11-17: qty 100

## 2021-11-17 MED ORDER — DEXAMETHASONE SODIUM PHOSPHATE 10 MG/ML IJ SOLN
INTRAMUSCULAR | Status: DC | PRN
  Administered 2021-11-17: 5 mg via INTRAVENOUS

## 2021-11-17 MED ORDER — ASPIRIN EC 81 MG PO TBEC: 81.0000 mg | DELAYED_RELEASE_TABLET | Freq: Two times a day (BID) | ORAL | 0 refills | Status: DC

## 2021-11-17 MED ORDER — ONDANSETRON HCL 4 MG/2ML IJ SOLN
INTRAMUSCULAR | Status: DC | PRN
  Administered 2021-11-17: 4 mg via INTRAVENOUS

## 2021-11-17 MED ORDER — FENTANYL CITRATE (PF) 250 MCG/5ML IJ SOLN
INTRAMUSCULAR | Status: DC | PRN
  Administered 2021-11-17: 50 ug via INTRAVENOUS
  Administered 2021-11-17 (×4): 25 ug via INTRAVENOUS
  Administered 2021-11-17: 50 ug via INTRAVENOUS

## 2021-11-17 MED ORDER — EPHEDRINE SULFATE (PRESSORS) 50 MG/ML IJ SOLN
INTRAMUSCULAR | Status: DC | PRN
  Administered 2021-11-17: 10 mg via INTRAVENOUS
  Administered 2021-11-17: 5 mg via INTRAVENOUS

## 2021-11-17 MED ORDER — OXYCODONE HCL 5 MG PO TABS
5.0000 mg | ORAL_TABLET | Freq: Once | ORAL | Status: AC
  Administered 2021-11-17: 5 mg via ORAL

## 2021-11-17 MED ORDER — BUPIVACAINE-EPINEPHRINE (PF) 0.5% -1:200000 IJ SOLN
INTRAMUSCULAR | Status: AC
  Filled 2021-11-17: qty 60

## 2021-11-17 MED ORDER — ORAL CARE MOUTH RINSE: 15.0000 mL | Freq: Once | OROMUCOSAL | Status: AC

## 2021-11-17 MED ORDER — LACTATED RINGERS IV SOLN: INTRAVENOUS | Status: DC

## 2021-11-17 MED ORDER — MIDAZOLAM HCL 2 MG/2ML IJ SOLN
INTRAMUSCULAR | Status: AC
  Filled 2021-11-17: qty 2

## 2021-11-17 MED ORDER — OXYCODONE HCL 5 MG PO TABS: 5.0000 mg | ORAL_TABLET | ORAL | 0 refills | Status: AC | PRN

## 2021-11-17 MED ORDER — PROPOFOL 10 MG/ML IV BOLUS
INTRAVENOUS | Status: AC
  Filled 2021-11-17: qty 20

## 2021-11-17 MED ORDER — PROPOFOL 10 MG/ML IV BOLUS
INTRAVENOUS | Status: DC | PRN
Start: 2021-11-17 — End: 2021-11-17
  Administered 2021-11-17: 180 mg via INTRAVENOUS
  Administered 2021-11-17: 20 mg via INTRAVENOUS

## 2021-11-17 MED ORDER — SODIUM CHLORIDE 0.9 % IR SOLN
Status: DC | PRN
  Administered 2021-11-17 (×3): 3000 mL

## 2021-11-17 MED ORDER — CHLORHEXIDINE GLUCONATE 0.12 % MT SOLN
15.0000 mL | Freq: Once | OROMUCOSAL | Status: AC
  Administered 2021-11-17: 15 mL via OROMUCOSAL

## 2021-11-17 MED ORDER — KETOROLAC TROMETHAMINE 30 MG/ML IJ SOLN
INTRAMUSCULAR | Status: AC
  Filled 2021-11-17: qty 1

## 2021-11-17 MED ORDER — FENTANYL CITRATE PF 50 MCG/ML IJ SOSY: 25.0000 ug | PREFILLED_SYRINGE | INTRAMUSCULAR | Status: DC | PRN

## 2021-11-17 MED ORDER — ACETAMINOPHEN 500 MG PO TABS: 1000.0000 mg | ORAL_TABLET | Freq: Three times a day (TID) | ORAL | 0 refills | Status: AC

## 2021-11-17 MED ORDER — EPINEPHRINE PF 1 MG/ML IJ SOLN
INTRAMUSCULAR | Status: AC
  Filled 2021-11-17: qty 4

## 2021-11-17 MED ORDER — FENTANYL CITRATE (PF) 100 MCG/2ML IJ SOLN
INTRAMUSCULAR | Status: AC
  Filled 2021-11-17: qty 2

## 2021-11-17 MED ORDER — OXYCODONE HCL 5 MG PO TABS
ORAL_TABLET | ORAL | Status: AC
  Filled 2021-11-17: qty 1

## 2021-11-17 MED ORDER — ONDANSETRON HCL 4 MG/2ML IJ SOLN
INTRAMUSCULAR | Status: AC
  Filled 2021-11-17: qty 2

## 2021-11-17 MED ORDER — ONDANSETRON HCL 4 MG PO TABS: 4.0000 mg | ORAL_TABLET | Freq: Three times a day (TID) | ORAL | 0 refills | Status: DC | PRN

## 2021-11-17 MED ORDER — ONDANSETRON HCL 4 MG/2ML IJ SOLN: 4.0000 mg | Freq: Once | INTRAMUSCULAR | Status: DC | PRN

## 2021-11-17 MED ORDER — DEXAMETHASONE SODIUM PHOSPHATE 10 MG/ML IJ SOLN
INTRAMUSCULAR | Status: AC
  Filled 2021-11-17: qty 1

## 2021-11-17 MED ORDER — MIDAZOLAM HCL 2 MG/2ML IJ SOLN
INTRAMUSCULAR | Status: DC | PRN
  Administered 2021-11-17: 2 mg via INTRAVENOUS

## 2021-11-17 SURGICAL SUPPLY — 54 items
ABLATOR ASPIRATE 50D MULTI-PRT (SURGICAL WAND) IMPLANT
APL PRP STRL LF DISP 70% ISPRP (MISCELLANEOUS) ×1
BAG HAMPER (MISCELLANEOUS) ×2 IMPLANT
BANDAGE ESMARK 4X12 BL STRL LF (DISPOSABLE) IMPLANT
BLADE SURG SZ11 CARB STEEL (BLADE) ×2 IMPLANT
BNDG CMPR 12X4 ELC STRL LF (DISPOSABLE) ×1
BNDG CMPR STD VLCR NS LF 5.8X6 (GAUZE/BANDAGES/DRESSINGS) ×2
BNDG ELASTIC 6X5.8 VLCR NS LF (GAUZE/BANDAGES/DRESSINGS) ×4 IMPLANT
BNDG ESMARK 4X12 BLUE STRL LF (DISPOSABLE) ×2
CHLORAPREP W/TINT 26 (MISCELLANEOUS) ×2 IMPLANT
CLOTH BEACON ORANGE TIMEOUT ST (SAFETY) ×2 IMPLANT
COOLER ICEMAN CLASSIC (MISCELLANEOUS) ×2 IMPLANT
CUFF TOURN SGL QUICK 34 (TOURNIQUET CUFF) ×2
CUFF TOURN SGL QUICK 34 NS (TOURNIQUET CUFF) ×1 IMPLANT
CUFF TRNQT CYL 34X4.125X (TOURNIQUET CUFF) ×1 IMPLANT
CUTTER BONE 4.0MM X 13CM (MISCELLANEOUS) ×1 IMPLANT
DECANTER SPIKE VIAL GLASS SM (MISCELLANEOUS) ×2 IMPLANT
DRAPE HALF SHEET 40X57 (DRAPES) ×2 IMPLANT
DRSG XEROFORM 1X8 (GAUZE/BANDAGES/DRESSINGS) ×2 IMPLANT
GAUZE 4X4 16PLY ~~LOC~~+RFID DBL (SPONGE) ×2 IMPLANT
GAUZE SPONGE 4X4 12PLY STRL (GAUZE/BANDAGES/DRESSINGS) ×2 IMPLANT
GAUZE SPONGE 4X4 12PLY STRL LF (GAUZE/BANDAGES/DRESSINGS) ×1 IMPLANT
GAUZE SPONGE 4X4 16PLY XRAY LF (GAUZE/BANDAGES/DRESSINGS) ×2 IMPLANT
GLOVE BIOGEL PI IND STRL 7.0 (GLOVE) ×2 IMPLANT
GLOVE BIOGEL PI INDICATOR 7.0 (GLOVE) ×3
GLOVE SRG 8 PF TXTR STRL LF DI (GLOVE) ×1 IMPLANT
GLOVE SURG POLYISO LF SZ8 (GLOVE) ×6 IMPLANT
GLOVE SURG UNDER POLY LF SZ8 (GLOVE) ×2
GOWN STRL REUS W/ TWL XL LVL3 (GOWN DISPOSABLE) ×1 IMPLANT
GOWN STRL REUS W/TWL LRG LVL3 (GOWN DISPOSABLE) ×2 IMPLANT
GOWN STRL REUS W/TWL XL LVL3 (GOWN DISPOSABLE) ×2
IV NS IRRIG 3000ML ARTHROMATIC (IV SOLUTION) ×5 IMPLANT
KIT BLADEGUARD II DBL (SET/KITS/TRAYS/PACK) ×2 IMPLANT
KIT TURNOVER CYSTO (KITS) ×2 IMPLANT
MANIFOLD NEPTUNE II (INSTRUMENTS) ×2 IMPLANT
MARKER SKIN DUAL TIP RULER LAB (MISCELLANEOUS) ×2 IMPLANT
NDL HYPO 18GX1.5 BLUNT FILL (NEEDLE) ×1 IMPLANT
NDL HYPO 21X1.5 SAFETY (NEEDLE) ×1 IMPLANT
NDL SPNL 18GX3.5 QUINCKE PK (NEEDLE) ×1 IMPLANT
NEEDLE HYPO 18GX1.5 BLUNT FILL (NEEDLE) ×2 IMPLANT
NEEDLE HYPO 21X1.5 SAFETY (NEEDLE) ×2 IMPLANT
NEEDLE SPNL 18GX3.5 QUINCKE PK (NEEDLE) ×2 IMPLANT
PACK ARTHRO LIMB DRAPE STRL (MISCELLANEOUS) ×2 IMPLANT
PAD ABD 5X9 TENDERSORB (GAUZE/BANDAGES/DRESSINGS) ×2 IMPLANT
PAD ARMBOARD 7.5X6 YLW CONV (MISCELLANEOUS) ×2 IMPLANT
PAD COLD SHLDR WRAP-ON (PAD) ×2 IMPLANT
PADDING CAST COTTON 6X4 STRL (CAST SUPPLIES) ×2 IMPLANT
PORT APPOLLO RF 90DEGREE MULTI (SURGICAL WAND) ×1 IMPLANT
SET ARTHROSCOPY INST (INSTRUMENTS) ×2 IMPLANT
SET BASIN LINEN APH (SET/KITS/TRAYS/PACK) ×2 IMPLANT
SUT ETHILON 3 0 FSL (SUTURE) ×2 IMPLANT
SYR 10ML LL (SYRINGE) ×2 IMPLANT
TUBE CONNECTING 12X1/4 (SUCTIONS) ×3 IMPLANT
TUBING IN/OUT FLOW W/MAIN PUMP (TUBING) ×2 IMPLANT

## 2021-11-17 NOTE — Transfer of Care (Signed)
Immediate Anesthesia Transfer of Care Note ? ?Patient: Dawn Barnes ? ?Procedure(s) Performed: KNEE ARTHROSCOPY WITH MEDIAL MENISECTOMY (Left: Knee) ? ?Patient Location: PACU ? ?Anesthesia Type:General ? ?Level of Consciousness: awake ? ?Airway & Oxygen Therapy: Patient Spontanous Breathing and Patient connected to face mask oxygen ? ?Post-op Assessment: Report given to RN and Post -op Vital signs reviewed and stable ? ?Post vital signs: Reviewed and stable ? ?Last Vitals:  ?Vitals Value Taken Time  ?BP 156/100 11/17/21 0857  ?Temp    ?Pulse 96 11/17/21 0858  ?Resp 19 11/17/21 0858  ?SpO2 93 % 11/17/21 0858  ?Vitals shown include unvalidated device data. ? ?Last Pain:  ?Vitals:  ? 11/17/21 0718  ?TempSrc: Oral  ?   ? ?  ? ?Complications: No notable events documented. ?

## 2021-11-17 NOTE — Anesthesia Preprocedure Evaluation (Signed)
Anesthesia Evaluation  ?Patient identified by MRN, date of birth, ID band ?Patient awake ? ? ? ?Reviewed: ?Allergy & Precautions, H&P , NPO status , Patient's Chart, lab work & pertinent test results, reviewed documented beta blocker date and time  ? ?History of Anesthesia Complications ?(+) PONV and history of anesthetic complications ? ?Airway ?Mallampati: II ? ?TM Distance: >3 FB ?Neck ROM: full ? ? ? Dental ?no notable dental hx. ? ?  ?Pulmonary ?neg pulmonary ROS,  ?  ?Pulmonary exam normal ?breath sounds clear to auscultation ? ? ? ? ? ? Cardiovascular ?Exercise Tolerance: Good ?negative cardio ROS ? ? ?Rhythm:regular Rate:Normal ? ? ?  ?Neuro/Psych ?PSYCHIATRIC DISORDERS Anxiety Depression negative neurological ROS ?   ? GI/Hepatic ?Neg liver ROS, GERD  Medicated,  ?Endo/Other  ?diabetes, Type 2Hypothyroidism  ? Renal/GU ?negative Renal ROS  ?negative genitourinary ?  ?Musculoskeletal ? ? Abdominal ?  ?Peds ? Hematology ? ?(+) Blood dyscrasia, anemia ,   ?Anesthesia Other Findings ? ? Reproductive/Obstetrics ?negative OB ROS ? ?  ? ? ? ? ? ? ? ? ? ? ? ? ? ?  ?  ? ? ? ? ? ? ? ? ?Anesthesia Physical ?Anesthesia Plan ? ?ASA: 3 ? ?Anesthesia Plan: General  ? ?Post-op Pain Management:   ? ?Induction:  ? ?PONV Risk Score and Plan: Propofol infusion ? ?Airway Management Planned:  ? ?Additional Equipment:  ? ?Intra-op Plan:  ? ?Post-operative Plan:  ? ?Informed Consent: I have reviewed the patients History and Physical, chart, labs and discussed the procedure including the risks, benefits and alternatives for the proposed anesthesia with the patient or authorized representative who has indicated his/her understanding and acceptance.  ? ? ? ?Dental Advisory Given ? ?Plan Discussed with: CRNA ? ?Anesthesia Plan Comments:   ? ? ? ? ? ? ?Anesthesia Quick Evaluation ? ?

## 2021-11-17 NOTE — Discharge Instructions (Signed)
?Nitasha Jewel A. Amedeo Kinsman, MD MS ?[facility] ?[address] ?[place],  [place]  [address] ?Phone: [phone] ?Fax:  [phone] ? ? ? ?POST-OPERATIVE INSTRUCTIONS - Knee Arthroscopy ? ?WOUND CARE ?- You may remove the Operative Dressing on Post-Op Day #3 (72hrs after surgery).  Alternatively if you would like you can leave dressing on until follow-up if within 7-8 days but keep it dry. ?- Leave steri-strips in place until they fall off on their own, usually 2 weeks postop. ?- An ACE wrap may be used to control swelling, do not wrap this too tight.  If the initial ACE wrap feels too tight you may loosen it. ?- There may be a small amount of fluid/bleeding leaking at the surgical site. This is normal; the knee is filled with fluid during the procedure and can leak for 24-48hrs after surgery. You may change/reinforce the bandage as needed.  ?- Use the Cryocuff or Ice as often as possible for the first 7 days, then as needed for pain relief. Always keep a towel, ACE wrap or other barrier between the cooling unit and your skin.  ?- You may shower on Post-Op Day #3. Gently pat the area dry. Do not soak the knee in water or submerge it. Do not go swimming in the pool or ocean until 4 weeks after surgery or when otherwise instructed.  Keep dry incisions as dry as possible. ? ? ?BRACE/AMBULATION ?-  You can use your brace until your nerve block wears off.  At that time you can remove it. ?-            Unless otherwise specified, you will not need a brace after this procedure.   ? ?REGIONAL ANESTHESIA (NERVE BLOCKS) ?- The anesthesia team may have performed a nerve block for you if safe in the setting of your care.  This is a great tool used to minimize pain.  Typically the block may start wearing off overnight.  This can be a challenging period but please utilize your as needed pain medications to try and manage this period and know it will be a brief transition as the nerve block wears completely   ? ?POST-OP MEDICATIONS - Multimodal approach to pain control ?- In general your pain will be controlled with a combination of substances.  Prescriptions unless otherwise discussed are electronically sent to your pharmacy.  This is a carefully made plan we use to minimize narcotic use.    ?- Celebrex - Anti-inflammatory medication taken on a scheduled basis ?- Acetaminophen - Non-narcotic pain medicine taken on a scheduled basis  ?- Oxycodone - This is a strong narcotic, to be used only on an ???as needed??? basis for pain. ?- Aspirin '81mg'$  - This medicine is used to minimize the risk of blood clots after surgery. ?-  Zofran - take as needed for nausea ? ?FOLLOW-UP  ?? Please call the office to schedule a follow-up appointment for your incision check, 7-10 days post-operatively. ? ?? IF YOU HAVE ANY QUESTIONS, PLEASE FEEL FREE TO CALL OUR OFFICE. ? ? ?HELPFUL INFORMATION ? ?- If you had a block, it will wear off between 8-24 hrs postop typically.  This is period when your pain may go from nearly zero to the pain you would have had post-op without the block.  This is an abrupt transition but nothing dangerous is happening.  You may take an extra dose of narcotic when this happens. ? ?? Keep your leg elevated to decrease swelling,  which will then in turn decrease your pain. I would elevate the foot of your bed by putting a couple of couch pillows between your mattress and box spring. I would not keep pillow directly under your ankle. ? ?- Do not sleep with a pillow behind your knee even if it is more comfortable as this may make it harder to get your knee fully straight long term. ? ?? There will be MORE swelling on days 1-3 than there is on the day of surgery.  This also is normal. The swelling will decrease with the anti-inflammatory medication, ice and keeping it elevated. The swelling will make it more difficult to bend your knee. As the swelling goes down your motion will become easier ? ?? You may develop  swelling and bruising that extends from your knee down to your calf and perhaps even to your foot over the next week. Do not be alarmed. This too is normal, and it is due to gravity ? ?? There may be some numbness adjacent to the incision site. This may last for 6-12 months or longer in some patients and is expected. ? ?? You may return to sedentary work/school in the next couple of days when you feel up to it. You will need to keep your leg elevated as much as possible  ? ?? You should wean off your narcotic medicines as soon as you are able.  Most patients will be off or using minimal narcotics before their first postop appointment.  ? ?? We suggest you use the pain medication the first night prior to going to bed, in order to ease any pain when the anesthesia wears off. You should avoid taking pain medications on an empty stomach as it will make you nauseous. ? ?? Do not drink alcoholic beverages or take illicit drugs when taking pain medications. ? ?? It is against the law to drive while taking narcotics. You cannot drive if your Right leg is in brace locked in extension. ? ?? Pain medication may make you constipated.  Below are a few solutions to try in this order: ? o Decrease the amount of pain medication if you aren't having pain. ? o Drink lots of decaffeinated fluids. ? o Drink prune juice and/or each dried prunes ? ? o If the first 3 don't work start with additional solutions ? o Take Colace - an over-the-counter stool softener ? o Take Senokot - an over-the-counter laxative ? o Take Miralax - a stronger over-the-counter laxative ? ? ? ? ? ? ?

## 2021-11-17 NOTE — Anesthesia Procedure Notes (Signed)
Procedure Name: LMA Insertion ?Date/Time: 11/17/2021 7:39 AM ?Performed by: Karna Dupes, CRNA ?Pre-anesthesia Checklist: Patient identified, Emergency Drugs available, Suction available and Patient being monitored ?Patient Re-evaluated:Patient Re-evaluated prior to induction ?Oxygen Delivery Method: Circle system utilized ?Preoxygenation: Pre-oxygenation with 100% oxygen ?Induction Type: IV induction ?LMA: LMA inserted ?LMA Size: 4.0 ?Number of attempts: 1 ?Placement Confirmation: breath sounds checked- equal and bilateral ?Tube secured with: Tape ?Dental Injury: Teeth and Oropharynx as per pre-operative assessment  ? ? ? ? ?

## 2021-11-17 NOTE — Anesthesia Postprocedure Evaluation (Signed)
Anesthesia Post Note ? ?Patient: Dawn Barnes ? ?Procedure(s) Performed: KNEE ARTHROSCOPY WITH MEDIAL MENISECTOMY (Left: Knee) ? ?Patient location during evaluation: Phase II ?Anesthesia Type: General ?Level of consciousness: awake ?Pain management: pain level controlled ?Vital Signs Assessment: post-procedure vital signs reviewed and stable ?Respiratory status: spontaneous breathing and respiratory function stable ?Cardiovascular status: blood pressure returned to baseline and stable ?Postop Assessment: no headache and no apparent nausea or vomiting ?Anesthetic complications: no ?Comments: Late entry ? ? ?No notable events documented. ? ? ?Last Vitals:  ?Vitals:  ? 11/17/21 0930 11/17/21 0947  ?BP:  (!) 145/74  ?Pulse: 85 80  ?Resp: 18 16  ?Temp:  36.6 ?C  ?SpO2: 94% 96%  ?  ?Last Pain:  ?Vitals:  ? 11/17/21 0947  ?TempSrc: Oral  ?PainSc:   ? ? ?  ?  ?  ?  ?  ?  ? ?Louann Sjogren ? ? ? ? ?

## 2021-11-17 NOTE — Op Note (Signed)
Orthopaedic Surgery Operative Note (CSN: [id number]) ? ?Dawn Barnes  August 31, 1965 ?Date of Surgery: 11/17/2021 ? ? ?Diagnoses:  ?Left knee medial mensicus tear ? ?Procedure: ?Left knee arthroscopy ?Left knee partial medial meniscectomy ?  ?Operative Finding ?Exam under anesthesia: Full ROM ?Suprapatellar pouch: No loose bodies ?Patellofemoral Compartment: Some fraying of the trochlea, as well as the undersurface of the patella.  No full-thickness cartilage loss. ?Medial Compartment: Grade 2 cartilage damage to the medial femoral condyle.  Grade 1/2 to the medial tibial plateau.  Posterior horn, radial tear, close to the root. ?Lateral Compartment: No cartilage damage. ?Intercondylar Notch: ACL and PCL intact. ? ?Successful completion of the planned procedure.  Left knee arthroscopy with debridement, as well as partial medial meniscectomy. ? ? ? ?Post-Op Diagnosis: Same ?Surgeons:Primary: Mordecai Rasmussen, MD ?Location: [facility] OR ROOM 4 ?Anesthesia: General with local anesthetic ?Antibiotics: Ancef 2 g ?Tourniquet time:  ?Total Tourniquet Time Documented: ?Thigh (Left) - 44 minutes ?Total: Thigh (Left) - 44 minutes ? ?Estimated Blood Loss: Minimal ?Complications: None ?Specimens: None ?Implants: ?* No implants in log * ? ?Indications for Surgery:   ?Dawn Barnes is a 56 y.o. female with persistent left knee pain, and an MRI demonstrating a posterior horn, medial meniscus tear.  Home exercises, medications and an injection did not provide sustained relief.  As result, we discussed the possibility proceeding with surgery.  She elected to proceed left knee arthroscopy.  Benefits and risks of operative and nonoperative management were discussed prior to surgery with the patient and informed consent form was completed.  Specific risks including infection, need for additional surgery, persistent pain, bleeding, progression of arthritis and more severe complications associated with anesthesia.  She like to proceed.  Surgical consent  was finalized. ? ? ?Procedure:   ?The patient was identified properly. Informed consent was obtained and the surgical site was marked. The patient was taken to the OR where general anesthesia was induced. The patient was placed in the supine position with a post against the surgical leg and a nonsterile tourniquet applied. The surgical leg was then prepped and draped usual sterile fashion.  A standard surgical timeout was performed.  2 standard anterior portals were made and diagnostic arthroscopy performed. Please note the findings as noted above. ? ?Based on the preoperative MRI we knew there was pathology within the medial compartment.  We started with this compartment as result.  She did have some cartilage damage, specifically to the medial femoral condyle.  There is no full-thickness cartilage loss, but there is clearly some softening, and partial-thickness loss of cartilage.  Within the posterior horn of the medial meniscus, there is a radial tear, just medial to the root.  Otherwise, the meniscus was stable.  The combination of basket and shaver, the tear of the posterior horn was debrided back to stable rim.  In total approximately 30% of the meniscus was removed from the posterior horn. ? ?We then turned our attention to the lateral compartment.  The meniscus was intact.  It was stable.  There was some mild fraying of the body, and the anterior horn of the meniscus, which was gently debrided.  We then noted some scar tissue overlying the lateral femoral condyle.  This was gently debrided with a combination of shaver and electrocautery.  There were no loose bodies within the medial lateral gutters.  No additional pathology was identified.  The instruments, as well as the camera were then removed. ? ?Incisions closed with interrupted suture. The patient  was awoken from general anesthesia and taken to the PACU in stable condition without complication.  ? ?Post-operative plan:  ?The patient will be  weightbearing as tolerated, with range of motion as tolerated. ?The patient will be discharged home, when she is stable in the PACU.   ?DVT prophylaxis Aspirin 81 mg twice daily for 6 weeks.   ?Pain control with PRN pain medication preferring oral medicines.   ?Follow up plan will be scheduled in approximately 7-10 days for incision check and XR. ? ? ?

## 2021-11-17 NOTE — Interval H&P Note (Signed)
History and Physical Interval Note: ? ?11/17/2021 ?7:14 AM ? ?Dawn Barnes  has presented today for surgery, with the diagnosis of Left knee medial mensicus tear.  The various methods of treatment have been discussed with the patient and family. After consideration of risks, benefits and other options for treatment, the patient has consented to  Procedure(s): ?KNEE ARTHROSCOPY WITH MEDIAL MENISECTOMY (Left) as a surgical intervention.  The patient's history has been reviewed, patient examined, no change in status, stable for surgery.  I have reviewed the patient's chart and labs.  Questions were answered to the patient's satisfaction.   ? ? ?Mordecai Rasmussen ? ? ?

## 2021-11-17 NOTE — Brief Op Note (Signed)
11/17/2021 ? ?9:06 AM ? ?PATIENT:  Dawn Barnes  55 y.o. female ? ?PRE-OPERATIVE DIAGNOSIS:  Left knee medial mensicus tear ? ?POST-OPERATIVE DIAGNOSIS:  Left knee medial mensicus tear ? ?PROCEDURE:  Procedure(s): ?KNEE ARTHROSCOPY WITH MEDIAL MENISECTOMY (Left) ? ?SURGEON:  Surgeon(s) and Role: ?   Amedeo Kinsman, Jeannette How, MD - Primary ? ?PHYSICIAN ASSISTANT: N/A ? ?ASSISTANTS: none  ? ?ANESTHESIA:   local and general ? ?EBL:  0 mL  ? ?BLOOD ADMINISTERED:none ? ?DRAINS: none  ? ?LOCAL MEDICATIONS USED:  MARCAINE    ? ?SPECIMEN:  No Specimen ? ?DISPOSITION OF SPECIMEN:  N/A ? ?COUNTS:  YES ? ?TOURNIQUET:   ?Total Tourniquet Time Documented: ?Thigh (Left) - 44 minutes ?Total: Thigh (Left) - 44 minutes ? ? ?DICTATION: .Note written in EPIC ? ?PLAN OF CARE: Discharge to home after PACU ? ?PATIENT DISPOSITION:  PACU - hemodynamically stable. ?  ?Delay start of Pharmacological VTE agent (>24hrs) due to surgical blood loss or risk of bleeding: yes ? ?

## 2021-11-18 ENCOUNTER — Emergency Department (HOSPITAL_COMMUNITY)
Admission: EM | Admit: 2021-11-18 | Discharge: 2021-11-18 | Disposition: A | Discharge status: Home / Self Care | Payer: Medicaid Other | Attending: Emergency Medicine | Admitting: Emergency Medicine

## 2021-11-18 ENCOUNTER — Encounter (HOSPITAL_COMMUNITY): Payer: Self-pay | Admitting: Emergency Medicine

## 2021-11-18 DIAGNOSIS — E119 Type 2 diabetes mellitus without complications: Secondary | ICD-10-CM | POA: Insufficient documentation

## 2021-11-18 DIAGNOSIS — G8918 Other acute postprocedural pain: Secondary | ICD-10-CM | POA: Insufficient documentation

## 2021-11-18 DIAGNOSIS — E039 Hypothyroidism, unspecified: Secondary | ICD-10-CM | POA: Diagnosis not present

## 2021-11-18 MED ORDER — OXYCODONE-ACETAMINOPHEN 5-325 MG PO TABS
2.0000 | ORAL_TABLET | Freq: Once | ORAL | Status: AC
  Administered 2021-11-18: 2 via ORAL
  Filled 2021-11-18: qty 2

## 2021-11-18 NOTE — Discharge Instructions (Signed)
You were evaluated in the Emergency Department and after careful evaluation, we did not find any emergent condition requiring admission or further testing in the hospital. ? ?Your exam/testing today is overall reassuring.  Follow the instructions given by your surgeon and take your home pain medicine as needed. ? ?Please return to the Emergency Department if you experience any worsening of your condition.   Thank you for allowing [place] to be a part of your care. ?

## 2021-11-18 NOTE — ED Triage Notes (Signed)
Pt c/o continued pain after having knee surgery this morning. Pt given oxy '5mg'$  by surgeon but states it isn't helping the pain.  ?

## 2021-11-18 NOTE — ED Provider Notes (Signed)
?[place] DEPT ?[facility] Emergency Department ?Provider Note ?MRN:  [id number]  ?Arrival date & time: 11/18/21    ? ?Chief Complaint   ?Post-op Problem ?  ?History of Present Illness   ?Dawn Barnes is a 56 y.o. year-old female with a history of diabetes presenting to the ED with chief complaint of postop pain. ? ?Patient had surgery for meniscus injury to the left knee earlier today.  Difficulty with the pain this evening, having trouble sleeping.  Denies fever, no significant swelling, no other complaints.  Explains that hurts more than it did before the surgery. ? ?Review of Systems  ?A thorough review of systems was obtained and all systems are negative except as noted in the HPI and PMH.  ? ?Patient's Health History   ? ?Past Medical History:  ?Diagnosis Date  ? Anemia   ? Anxiety   ? Constipation 04/30/2013  ? Depression   ? Diabetes mellitus without complication ([place])   ? borderline  ? Fibroids, intramural 01/21/2016  ? GERD (gastroesophageal reflux disease)   ? Heartburn   ? Hot flashes 01/11/2016  ? Hypothyroidism   ? Mixed incontinence   ? MS (multiple sclerosis) ([place])   ? Pancreatitis   ? PONV (postoperative nausea and vomiting)   ? PTSD (post-traumatic stress disorder)   ? Thyroid disease   ? Urge urinary incontinence 05/10/2015  ? Vaginal bleeding 01/11/2016  ?  ?Past Surgical History:  ?Procedure Laterality Date  ? BIOPSY  12/07/2015  ? Procedure: BIOPSY;  Surgeon: Danie Binder, MD;  Location: [facility] ENDO SUITE;  Service: Endoscopy;;  right and left colon biopsies, gastric bx's   ? COLONOSCOPY WITH PROPOFOL N/A 12/07/2015  ? Dr. Oneida Alar: mild proctitis, hyperplastic polyp   ? ENDOMETRIAL ABLATION    ? ESOPHAGOGASTRODUODENOSCOPY (EGD) WITH PROPOFOL N/A 12/07/2015  ? Dr. 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   ? INCONTINENCE SURGERY    ? SAVORY DILATION N/A 12/07/2015  ? Procedure: SAVORY DILATION;  Surgeon: Danie Binder, MD;  Location: [facility]  ENDO SUITE;  Service: Endoscopy;  Laterality: N/A;  ? TUBAL LIGATION    ?  ?Family History  ?Problem Relation Age of Onset  ? Diabetes Father   ? Heart disease Father   ? Hypertension Paternal Aunt   ? Heart disease Paternal Aunt   ? Seizures Paternal Aunt   ? Breast cancer Paternal Aunt   ? Heart disease Paternal Grandmother   ? Depression Daughter   ? Hypertension Sister   ? Heart disease Sister   ? Depression Son   ? ADD / ADHD Son   ? Depression Daughter   ? Colon cancer Neg Hx   ? Colon polyps Neg Hx   ?  ?Social History  ? ?Socioeconomic History  ? Marital status: Legally Separated  ?  Spouse name: Not on file  ? Number of children: Not on file  ? Years of education: Not on file  ? Highest education level: Not on file  ?Occupational History  ? Occupation: disability  ?Tobacco Use  ? Smoking status: Never  ? Smokeless tobacco: Never  ?Vaping Use  ? Vaping Use: Never used  ?Substance and Sexual Activity  ? Alcohol use: No  ?  Alcohol/week: 0.0 standard drinks  ? Drug use: No  ? Sexual activity: Not Currently  ?  Birth control/protection: Surgical  ?  Comment: tubal and ablation  ?Other Topics Concern  ? Not on file  ?Social History  Narrative  ? Not on file  ? ?Social Determinants of Health  ? ?Financial Resource Strain: Not on file  ?Food Insecurity: Not on file  ?Transportation Needs: Not on file  ?Physical Activity: Not on file  ?Stress: Not on file  ?Social Connections: Not on file  ?Intimate Partner Violence: Not on file  ?  ? ?Physical Exam  ? ?Vitals:  ? 11/18/21 0138  ?BP: (!) 151/90  ?Pulse: 85  ?Resp: 17  ?Temp: 97.9 ?F (36.6 ?C)  ?SpO2: 95%  ?  ?CONSTITUTIONAL: Well-appearing, NAD ?NEURO/PSYCH:  Alert and oriented x 3, no focal deficits ?EYES:  eyes equal and reactive ?ENT/NECK:  no LAD, no JVD ?CARDIO: Regular rate, well-perfused, normal S1 and S2 ?PULM:  CTAB no wheezing or rhonchi ?GI/GU:  non-distended, non-tender ?MSK/SPINE:  No gross deformities, no edema, dressing to the left knee is clean dry  and intact ?SKIN:  no rash, atraumatic ? ? ?*Additional and/or pertinent findings included in MDM below ? ?Diagnostic and Interventional Summary  ? ? EKG Interpretation ? ?Date/Time:    ?Ventricular Rate:    ?PR Interval:    ?QRS Duration:   ?QT Interval:    ?QTC Calculation:   ?R Axis:     ?Text Interpretation:   ?  ? ?  ? ?Labs Reviewed - No data to display  ?No orders to display  ?  ?Medications  ?oxyCODONE-acetaminophen (PERCOCET/ROXICET) 5-325 MG per tablet 2 tablet (2 tablets Oral Given 11/18/21 0212)  ?  ? ?Procedures  /  Critical Care ?Procedures ? ?ED Course and Medical Decision Making  ?Initial Impression and Ddx ?Postop pain, overall seems to be a misunderstanding, not sure why patient was expecting no pain postop.  The leg looks good, no significant swelling, no erythema, neurovascularly intact.  No other symptoms.  Will provide some pain control and reassess. ? ?Past medical/surgical history that increases complexity of ED encounter: None ? ?Interpretation of Diagnostics ?Not applicable ? ?Patient Reassessment and Ultimate Disposition/Management ?Patient feeling better after Percocet, no emergent process appropriate for discharge. ? ?Patient management required discussion with the following services or consulting groups:  None ? ?Complexity of Problems Addressed ?Acute illness or injury that poses threat of life of bodily function ? ?Additional Data Reviewed and Analyzed ?Further history obtained from: ?None ? ?Additional Factors Impacting ED Encounter Risk ?None ? ?Barth Kirks. Sedonia Small, MD ?[facility] Emergency Medicine ?[email or web address] ? ?Final Clinical Impressions(s) / ED Diagnoses  ? ?  ICD-10-CM   ?1. Post-operative pain  G89.18   ?  ?  ?ED Discharge Orders   ? ? None  ? ?  ?  ? ?Discharge Instructions Discussed with and Provided to Patient:  ? ? ? ?Discharge Instructions   ? ?  ?You were evaluated in the Emergency Department and after careful evaluation, we did not find  any emergent condition requiring admission or further testing in the hospital. ? ?Your exam/testing today is overall reassuring.  Follow the instructions given by your surgeon and take your home pain medicine as needed. ? ?Please return to the Emergency Department if you experience any worsening of your condition.   Thank you for allowing [place] to be a part of your care. ? ? ? ? ?  ?Maudie Flakes, MD ?11/18/21 [phone] ? ?

## 2021-11-22 LAB — VITAMIN D 25 HYDROXY (VIT D DEFICIENCY, FRACTURES): Vit D, 25-Hydroxy: 84.4 ng/mL (ref 30.0–100.0)

## 2021-11-22 LAB — TSH: TSH: 0.054 u[IU]/mL — ABNORMAL LOW (ref 0.450–4.500)

## 2021-11-22 LAB — T4, FREE: Free T4: 1.87 ng/dL — ABNORMAL HIGH (ref 0.82–1.77)

## 2021-11-25 ENCOUNTER — Ambulatory Visit (INDEPENDENT_AMBULATORY_CARE_PROVIDER_SITE_OTHER): Payer: Medicaid Other | Admitting: Orthopedic Surgery

## 2021-11-25 ENCOUNTER — Encounter: Payer: Self-pay | Admitting: Orthopedic Surgery

## 2021-11-25 VITALS — Ht 64.0 in | Wt 205.0 lb

## 2021-11-25 DIAGNOSIS — S838X2D Sprain of other specified parts of left knee, subsequent encounter: Secondary | ICD-10-CM

## 2021-11-25 NOTE — Patient Instructions (Signed)
?Knee Exercises ? ?Ask your health care provider which exercises are safe for you. Do exercises exactly as told by your health care provider and adjust them as directed. It is normal to feel mild stretching, pulling, tightness, or discomfort as you do these exercises. Stop right away if you feel sudden pain or your pain gets worse. Do not begin these exercises until told by your health care provider. ? ?Stretching and range-of-motion exercises ?These exercises warm up your muscles and joints and improve the movement and flexibility of your knee. These exercises also help to relieve pain and swelling. ? ?Knee extension, prone ?Lie on your abdomen (prone position) on a bed. ?Place your left / right knee just beyond the edge of the surface so your knee is not on the bed. You can put a towel under your left / right thigh just above your kneecap for comfort. ?Relax your leg muscles and allow gravity to straighten your knee (extension). You should feel a stretch behind your left / right knee. ?Hold this position for 10 seconds. ?Scoot up so your knee is supported between repetitions. ?Repeat 10 times. Complete this exercise 3-4 times per week. ?    ?Knee flexion, active ?Lie on your back with both legs straight. If this causes back discomfort, bend your left / right knee so your foot is flat on the floor. ?Slowly slide your left / right heel back toward your buttocks. Stop when you feel a gentle stretch in the front of your knee or thigh (flexion). ?Hold this position for 10 seconds. ?Slowly slide your left / right heel back to the starting position. ?Repeat 10 times. Complete this exercise 3-4 times per week. ?  ?   ?Quadriceps stretch, prone ?Lie on your abdomen on a firm surface, such as a bed or padded floor. ?Bend your left / right knee and hold your ankle. If you cannot reach your ankle or pant leg, loop a belt around your foot and grab the belt instead. ?Gently pull your heel toward your buttocks. Your knee should  not slide out to the side. You should feel a stretch in the front of your thigh and knee (quadriceps). ?Hold this position for 10 seconds. ?Repeat 10 times. Complete this exercise 3-4 times per week. ?  ?   ?Hamstring, supine ?Lie on your back (supine position). ?Loop a belt or towel over the ball of your left / right foot. The ball of your foot is on the walking surface, right under your toes. ?Straighten your left / right knee and slowly pull on the belt to raise your leg until you feel a gentle stretch behind your knee (hamstring). ?Do not let your knee bend while you do this. ?Keep your other leg flat on the floor. ?Hold this position for 10 seconds. ?Repeat 10 times. Complete this exercise 3-4 times per week. ? ? ?Strengthening exercises ?These exercises build strength and endurance in your knee. Endurance is the ability to use your muscles for a long time, even after they get tired. ? ?Quadriceps, isometric ?This exercise stretches the muscles in front of your thigh (quadriceps) without moving your knee joint (isometric). ?Lie on your back with your left / right leg extended and your other knee bent. Put a rolled towel or small pillow under your knee if told by your health care provider. ?Slowly tense the muscles in the front of your left / right thigh. You should see your kneecap slide up toward your hip or see increased dimpling  just above the knee. This motion will push the back of the knee toward the floor. ?For 10 seconds, hold the muscle as tight as you can without increasing your pain. ?Relax the muscles slowly and completely. ?Repeat 10 times. Complete this exercise 3-4 times per week. ?.  ?   ?Straight leg raises ?This exercise stretches the muscles in front of your thigh (quadriceps) and the muscles that move your hips (hip flexors). ?Lie on your back with your left / right leg extended and your other knee bent. ?Tense the muscles in the front of your left / right thigh. You should see your kneecap  slide up or see increased dimpling just above the knee. Your thigh may even shake a bit. ?Keep these muscles tight as you raise your leg 4-6 inches (10-15 cm) off the floor. Do not let your knee bend. ?Hold this position for 10 seconds. ?Keep these muscles tense as you lower your leg. ?Relax your muscles slowly and completely after each repetition. ?Repeat 10 times. Complete this exercise 3-4 times per week. ? ?Hamstring, isometric ?Lie on your back on a firm surface. ?Bend your left / right knee about 30 degrees. ?Dig your left / right heel into the surface as if you are trying to pull it toward your buttocks. Tighten the muscles in the back of your thighs (hamstring) to "dig" as hard as you can without increasing any pain. ?Hold this position for 10 seconds. ?Release the tension gradually and allow your muscles to relax completely for __________ seconds after each repetition. ?Repeat 10 times. Complete this exercise 3-4 times per week. ? ?Hamstring curls ?If told by your health care provider, do this exercise while wearing ankle weights. Begin with 5 lb weights. Then increase the weight by 1 lb (0.5 kg) increments. You can also use an exercise band ?Lie on your abdomen with your legs straight. ?Bend your left / right knee as far as you can without feeling pain. Keep your hips flat against the floor. ?Hold this position for 10 seconds. ?Slowly lower your leg to the starting position. ?Repeat 10 times. Complete this exercise 3-4 times per week. ?  ?   ?Squats ?This exercise strengthens the muscles in front of your thigh and knee (quadriceps). ?Stand in front of a table, with your feet and knees pointing straight ahead. You may rest your hands on the table for balance but not for support. ?Slowly bend your knees and lower your hips like you are going to sit in a chair. ?Keep your weight over your heels, not over your toes. ?Keep your lower legs upright so they are parallel with the table legs. ?Do not let your hips  go lower than your knees. ?Do not bend lower than told by your health care provider. ?If your knee pain increases, do not bend as low. ?Hold the squat position for 10 seconds. ?Slowly push with your legs to return to standing. Do not use your hands to pull yourself to standing. ?Repeat 10 times. Complete this exercise 3-4 times per week ?. ?    ?Wall slides ?This exercise strengthens the muscles in front of your thigh and knee (quadriceps). ?Lean your back against a smooth wall or door, and walk your feet out 18-24 inches (46-61 cm) from it. ?Place your feet hip-width apart. ?Slowly slide down the wall or door until your knees bend 90 degrees. Keep your knees over your heels, not over your toes. Keep your knees in line with your hips. ?Hold  this position for 10 seconds. ?Repeat 10 times. Complete this exercise 3-4 times per week. ?  ?   ?Straight leg raises ?This exercise strengthens the muscles that rotate the leg at the hip and move it away from your body (hip abductors). ?Lie on your side with your left / right leg in the top position. Lie so your head, shoulder, knee, and hip line up. You may bend your bottom knee to help you keep your balance. ?Roll your hips slightly forward so your hips are stacked directly over each other and your left / right knee is facing forward. ?Leading with your heel, lift your top leg 4-6 inches (10-15 cm). You should feel the muscles in your outer hip lifting. ?Do not let your foot drift forward. ?Do not let your knee roll toward the ceiling. ?Hold this position for 10 seconds. ?Slowly return your leg to the starting position. ?Let your muscles relax completely after each repetition. ?Repeat 10 times. Complete this exercise 3-4 times per week. ?  ?   ?Straight leg raises ?This exercise stretches the muscles that move your hips away from the front of the pelvis (hip extensors). ?Lie on your abdomen on a firm surface. You can put a pillow under your hips if that is more  comfortable. ?Tense the muscles in your buttocks and lift your left / right leg about 4-6 inches (10-15 cm). Keep your knee straight as you lift your leg. ?Hold this position for 10 seconds. ?Slowly lower your leg t

## 2021-11-25 NOTE — Progress Notes (Signed)
Orthopaedic Postop Note ? ?Assessment: ?Dawn Barnes is a 56 y.o. female s/p left knee arthroscopy with partial medial meniscectomy ? ?DOS: 11/17/21 ? ?Plan: ?Sutures removed, steri strips placed ?Procedure discussed, all questions answered ?Anticipate gradual improvement.  ?Encouraged walking and knee exercises ?Can take 2-3 months for full recovery ?She is doing well at this time, ROM 0-90, no swelling ? ?Follow-up: ?Return in about 4 weeks (around 12/23/2021). ?XR at next visit: No XR ? ?Subjective: ? ?Chief Complaint  ?Patient presents with  ? Routine Post Op  ?  Lt knee DOS 11/17/21  ? ? ?History of Present Illness: ?Dawn Barnes is a 56 y.o. female who presents following the above stated procedure.  Surgery was a week ago.  She is doing very well.  She is no longer taking pain medications.  She is walking without a cane or crutches.  She is not using the ice machine.  She has been doing some simple exercises for your knee.  She has noticed some popping in the knee, but this is not painful.  ? ?Review of Systems: ?No fevers or chills ?No numbness or tingling ?No Chest Pain ?No shortness of breath ? ? ?Objective: ?Ht '5\' 4"'$  (1.626 m)   Wt 205 lb (93 kg)   LMP  (LMP Unknown)   BMI 35.19 kg/m?  ? ?Physical Exam: ? ?Left knee without swelling.  Incisions are healing well.  No surrounding erythema or drainage.  She has some tenderness over the incisions.  ROM from 0-90 degrees.  ? ?IMAGING: ?I personally ordered and reviewed the following images: ? ?No new imaging obtained today ? ?Mordecai Rasmussen, MD ?11/25/2021 ?9:15 AM ? ? ?

## 2021-11-29 ENCOUNTER — Encounter: Payer: Self-pay | Admitting: Nurse Practitioner

## 2021-11-29 ENCOUNTER — Ambulatory Visit (INDEPENDENT_AMBULATORY_CARE_PROVIDER_SITE_OTHER): Payer: Medicaid Other | Admitting: Nurse Practitioner

## 2021-11-29 VITALS — BP 116/82 | HR 76 | Ht 64.0 in | Wt 202.0 lb

## 2021-11-29 DIAGNOSIS — R7303 Prediabetes: Secondary | ICD-10-CM

## 2021-11-29 DIAGNOSIS — E559 Vitamin D deficiency, unspecified: Secondary | ICD-10-CM | POA: Diagnosis not present

## 2021-11-29 DIAGNOSIS — E038 Other specified hypothyroidism: Secondary | ICD-10-CM

## 2021-11-29 MED ORDER — LEVOTHYROXINE SODIUM 75 MCG PO TABS: 75.0000 ug | ORAL_TABLET | Freq: Every day | ORAL | 1 refills | Status: DC

## 2021-11-29 NOTE — Patient Instructions (Signed)

## 2021-11-29 NOTE — Progress Notes (Signed)
?11/29/2021 ? ?Endocrinology follow-up note ? ? ? ?Subjective:  ? ? Patient ID: Dawn Barnes, female    DOB: 08/06/1965, PCP Carrolyn Meiers, MD ? ? ?Past Medical History:  ?Diagnosis Date  ? Anemia   ? Anxiety   ? Constipation 04/30/2013  ? Depression   ? Diabetes mellitus without complication ([name])   ? borderline  ? Fibroids, intramural 01/21/2016  ? GERD (gastroesophageal reflux disease)   ? Heartburn   ? Hot flashes 01/11/2016  ? Hypothyroidism   ? Mixed incontinence   ? MS (multiple sclerosis) ([place])   ? Pancreatitis   ? PONV (postoperative nausea and vomiting)   ? PTSD (post-traumatic stress disorder)   ? Thyroid disease   ? Urge urinary incontinence 05/10/2015  ? Vaginal bleeding 01/11/2016  ? ?Past Surgical History:  ?Procedure Laterality Date  ? BIOPSY  12/07/2015  ? Procedure: BIOPSY;  Surgeon: Danie Binder, MD;  [facility];  Service: Endoscopy;;  right and left colon biopsies, gastric bx's   ? COLONOSCOPY WITH PROPOFOL N/A 12/07/2015  ? Dr. Oneida Alar: mild proctitis, hyperplastic polyp   ? ENDOMETRIAL ABLATION    ? ESOPHAGOGASTRODUODENOSCOPY (EGD) WITH PROPOFOL N/A 12/07/2015  ? Dr. 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   ? INCONTINENCE SURGERY    ? KNEE ARTHROSCOPY WITH MEDIAL MENISECTOMY Left 11/17/2021  ? Procedure: KNEE ARTHROSCOPY WITH MEDIAL MENISECTOMY;  Surgeon: Mordecai Rasmussen, MD;  Location: [facility];  Service: Orthopedics;  Laterality: Left;  ? SAVORY DILATION N/A 12/07/2015  ? Procedure: SAVORY DILATION;  Surgeon: Danie Binder, MD;  [facility];  Service: Endoscopy;  Laterality: N/A;  ? TUBAL LIGATION    ? ?Social History  ? ?Socioeconomic History  ? Marital status: Legally Separated  ?  Spouse name: Not on file  ? Number of children: Not on file  ? Years of education: Not on file  ? Highest education level: Not on file  ?Occupational History  ? Occupation: disability  ?Tobacco Use  ? Smoking status: Never  ?  Smokeless tobacco: Never  ?Vaping Use  ? Vaping Use: Never used  ?Substance and Sexual Activity  ? Alcohol use: No  ?  Alcohol/week: 0.0 standard drinks  ? Drug use: No  ? Sexual activity: Not Currently  ?  Birth control/protection: Surgical  ?  Comment: tubal and ablation  ?Other Topics Concern  ? Not on file  ?Social History Narrative  ? Not on file  ? ?Social Determinants of Health  ? ?Financial Resource Strain: Not on file  ?Food Insecurity: Not on file  ?Transportation Needs: Not on file  ?Physical Activity: Not on file  ?Stress: Not on file  ?Social Connections: Not on file  ? ?Outpatient Encounter Medications as of 11/29/2021  ?Medication Sig  ? acetaminophen (TYLENOL) 500 MG tablet Take 2 tablets (1,000 mg total) by mouth every 8 (eight) hours for 14 days.  ? cetirizine (ZYRTEC) 10 MG tablet Take 10 mg by mouth daily.  ? Cholecalciferol (VITAMIN D3) 125 MCG (5000 UT) TABS Take 5,000 Units by mouth daily.  ? Dimethyl Fumarate 240 MG CPDR Take 240 mg by mouth 2 (two) times daily.  ? escitalopram (LEXAPRO) 20 MG tablet Take 20 mg by mouth daily.  ? gabapentin (NEURONTIN) 600 MG tablet Take 600 mg by mouth 2 (two) times daily.  ? hydrochlorothiazide (HYDRODIURIL) 12.5 MG tablet Take 12.5 mg by mouth daily.  ? levothyroxine (SYNTHROID) 75 MCG tablet  Take 1 tablet (75 mcg total) by mouth daily before breakfast.  ? LINZESS 290 MCG CAPS capsule TAKE 1 CAPSULE BY MOUTH DAILY BEFORE BREAKFAST.  ? meclizine (ANTIVERT) 25 MG tablet Take 25 mg by mouth 3 (three) times daily as needed for dizziness.  ? methocarbamol (ROBAXIN) 500 MG tablet Take 500 mg by mouth 2 (two) times daily.  ? omeprazole (PRILOSEC) 20 MG capsule Take 40 mg by mouth daily.  ? psyllium (METAMUCIL) 58.6 % packet Take 1 packet by mouth daily as needed (constipation).  ? simethicone (MYLICON) 540 MG chewable tablet Chew 125 mg by mouth every 6 (six) hours as needed for flatulence.  ? TOVIAZ 8 MG TB24 tablet Take 8 mg by mouth daily.  ? [DISCONTINUED]  aspirin EC 81 MG tablet Take 1 tablet (81 mg total) by mouth in the morning and at bedtime. Swallow whole.  ? [DISCONTINUED] levothyroxine (SYNTHROID) 88 MCG tablet Take 1 tablet (88 mcg total) by mouth daily before breakfast.  ? [DISCONTINUED] ondansetron (ZOFRAN) 4 MG tablet Take 1 tablet (4 mg total) by mouth every 8 (eight) hours as needed for up to 14 days for nausea or vomiting.  ? ?No facility-administered encounter medications on file as of 11/29/2021.  ? ?ALLERGIES: ?Allergies  ?Allergen Reactions  ? Ibuprofen Other (See Comments)  ? Nsaids Other (See Comments)  ?  Pt states MD told her not to take due to stomach irritation  ? Tuberculin Tests Dermatitis  ? ?VACCINATION STATUS: ?Immunization History  ?Administered Date(s) Administered  ? Influenza-Unspecified 04/30/2014, 03/15/2015  ? ? ?Thyroid Problem ?Presents for follow-up (patient with medical history of hypothyroidism from Hashimoto's thyroiditis. ) visit. Symptoms include fatigue. Patient reports no anxiety, cold intolerance, constipation, depressed mood, diarrhea, heat intolerance, palpitations, tremors, weight gain or weight loss. The symptoms have been stable.  ? ?She also has history of prediabetes not on medications.  She is also on ongoing supplement for vitamin D deficiency. ? ? ?Review of systems ? ?Constitutional: + Minimally fluctuating body weight,  current Body mass index is 34.67 kg/m?. , + intermittent fatigue, mild subjective hyperthermia, no subjective hypothermia ?Eyes: no blurry vision, no xerophthalmia ?ENT: no sore throat, no nodules palpated in throat, no dysphagia/odynophagia, no hoarseness ?Cardiovascular: no chest pain, no shortness of breath, no palpitations, no leg swelling ?Respiratory: no cough, no shortness of breath ?Gastrointestinal: no nausea/vomiting/diarrhea ?Musculoskeletal: no muscle/joint aches, + loses balance at times due to MS, generalized weakness (usually in BLE), recently had knee surgery ?Skin: no rashes, no  hyperemia ?Neurological: mild tremors, no numbness, no tingling, no dizziness ?Psychiatric: no depression, no anxiety ? ?Objective:  ?  ?BP 116/82   Pulse 76   Ht '5\' 4"'$  (1.626 m)   Wt 202 lb (91.6 kg)   LMP  (LMP Unknown)   BMI 34.67 kg/m?   ?Wt Readings from Last 3 Encounters:  ?11/29/21 202 lb (91.6 kg)  ?11/25/21 205 lb (93 kg)  ?11/18/21 205 lb (93 kg)  ?  ? ?BP Readings from Last 3 Encounters:  ?11/29/21 116/82  ?11/18/21 130/79  ?11/17/21 (!) 145/74  ? ? ? ?Physical Exam- Limited ? ?Constitutional:  Body mass index is 34.67 kg/m?. , not in acute distress, normal state of mind ?Eyes:  EOMI, no exophthalmos ?Neck: Supple ?Cardiovascular: RRR, no murmurs, rubs, or gallops, no edema ?Respiratory: Adequate breathing efforts, no crackles, rales, rhonchi, or wheezing ?Musculoskeletal: no gross deformities, strength intact in all four extremities, no gross restriction of joint movements ?Skin:  no rashes, no  hyperemia ?Neurological: no tremor with outstretched hands ? ? ?Recent Results (from the past 2160 hour(s))  ?hCG, Quantitative, Pregnancy (serum - [facility])     Status: None  ? Collection Time: 11/14/21  2:57 PM  ?Result Value Ref Range  ? hCG, Beta Chain, Quant, S 4 <5 mIU/mL  ?  Comment:        ?  GEST. AGE      CONC.  (mIU/mL) ?  <=1 WEEK        5 - 50 ?    2 WEEKS       50 - 500 ?    3 WEEKS       100 - 10,000 ?    4 WEEKS     1,000 - 30,000 ?    5 WEEKS     3,500 - 115,000 ?  6-8 WEEKS     12,000 - 270,000 ?   12 WEEKS     15,000 - 220,000 ?       ?FEMALE AND NON-PREGNANT FEMALE: ?    LESS THAN 5 mIU/mL ?Performed at [facility], [address], [place], [place] [address] ?  ?Hemoglobin A1c     Status: None  ? Collection Time: 11/14/21  2:57 PM  ?Result Value Ref Range  ? Hgb A1c MFr Bld 5.6 4.8 - 5.6 %  ?  Comment: (NOTE) ?Pre diabetes:          5.7%-6.4% ? ?Diabetes:              >6.4% ? ?Glycemic control for   <7.0% ?adults with diabetes ?  ? Mean Plasma Glucose 114.02 mg/dL  ?  Comment: Performed at [facility], [place] [address] [place], [place] [address]  ?CBC     Status: Abnormal  ? Collection Time: 11/14/21  2:57 PM  ?Result Value Ref Range  ? WBC 3.3 (L) 4.0 - 10.5 K/uL  ? RBC 4.08 3.87 - 5.11 MIL/uL  ? Hem

## 2021-12-13 ENCOUNTER — Encounter: Payer: Self-pay | Admitting: Gastroenterology

## 2021-12-13 ENCOUNTER — Ambulatory Visit (INDEPENDENT_AMBULATORY_CARE_PROVIDER_SITE_OTHER): Payer: Medicaid Other | Admitting: Gastroenterology

## 2021-12-13 VITALS — BP 112/68 | HR 58 | Temp 98.1°F | Ht 64.0 in | Wt 203.4 lb

## 2021-12-13 DIAGNOSIS — K5900 Constipation, unspecified: Secondary | ICD-10-CM

## 2021-12-13 DIAGNOSIS — K219 Gastro-esophageal reflux disease without esophagitis: Secondary | ICD-10-CM | POA: Diagnosis not present

## 2021-12-13 NOTE — Progress Notes (Signed)
Gastroenterology Office Note     Primary Care Physician:  Carrolyn Meiers, MD  Primary Gastroenterologist: Dr. Abbey Chatters   Chief Complaint   Chief Complaint  Patient presents with   Follow-up     History of Present Illness   Dawn Barnes is a 56 y.o. female presenting today in follow-up with a history of dysphagia likely secondary to web and/or poor dentition, chronic constipation and GERD. Colonoscopy with mild proctitis in setting of NSAIDs 2017. Edentulous. Last EGD in May 2017.   GERD: omeprazole 40 mg daily. Doing well on this daily.   Constipation: Linzess 290 mcg daily. Taking Metamucil. Doesn't drink water like she should.   Had shooting pain in LLQ and RLQ a few times for a few minutes. Resolved on own. Doesn't believe she was constipated but not sure.   No other complaints.       Past Medical History:  Diagnosis Date   Anemia    Anxiety    Constipation 04/30/2013   Depression    Diabetes mellitus without complication ([facility])    borderline   Fibroids, intramural 01/21/2016   GERD (gastroesophageal reflux disease)    Heartburn    Hot flashes 01/11/2016   Hypothyroidism    Mixed incontinence    MS (multiple sclerosis) ([facility])    Pancreatitis    PONV (postoperative nausea and vomiting)    PTSD (post-traumatic stress disorder)    Thyroid disease    Urge urinary incontinence 05/10/2015   Vaginal bleeding 01/11/2016    Past Surgical History:  Procedure Laterality Date   BIOPSY  12/07/2015   Procedure: BIOPSY;  Surgeon: Danie Binder, MD;  [facility];  Service: Endoscopy;;  right and left colon biopsies, gastric bx's    COLONOSCOPY WITH PROPOFOL N/A 12/07/2015   Dr. Oneida Alar: mild proctitis, hyperplastic polyp    ENDOMETRIAL ABLATION     ESOPHAGOGASTRODUODENOSCOPY (EGD) WITH PROPOFOL N/A 12/07/2015   Dr. 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    INCONTINENCE SURGERY     KNEE  ARTHROSCOPY WITH MEDIAL MENISECTOMY Left 11/17/2021   Procedure: KNEE ARTHROSCOPY WITH MEDIAL MENISECTOMY;  Surgeon: Mordecai Rasmussen, MD;  Location: [facility];  Service: Orthopedics;  Laterality: Left;   SAVORY DILATION N/A 12/07/2015   Procedure: SAVORY DILATION;  Surgeon: Danie Binder, MD;  [facility];  Service: Endoscopy;  Laterality: N/A;   TUBAL LIGATION      Current Outpatient Medications  Medication Sig Dispense Refill   cetirizine (ZYRTEC) 10 MG tablet Take 10 mg by mouth daily.     Cholecalciferol (VITAMIN D3) 125 MCG (5000 UT) TABS Take 5,000 Units by mouth daily.     Dimethyl Fumarate 240 MG CPDR Take 240 mg by mouth 2 (two) times daily.     escitalopram (LEXAPRO) 20 MG tablet Take 20 mg by mouth daily.     gabapentin (NEURONTIN) 600 MG tablet Take 600 mg by mouth 2 (two) times daily.     hydrochlorothiazide (HYDRODIURIL) 12.5 MG tablet Take 12.5 mg by mouth daily.     levothyroxine (SYNTHROID) 75 MCG tablet Take 1 tablet (75 mcg total) by mouth daily before breakfast. 90 tablet 1   LINZESS 290 MCG CAPS capsule TAKE 1 CAPSULE BY MOUTH DAILY BEFORE BREAKFAST. 90 capsule 3   meclizine (ANTIVERT) 25 MG tablet Take 25 mg by mouth 3 (three) times daily as needed for dizziness.     methocarbamol (ROBAXIN)  500 MG tablet Take 500 mg by mouth 2 (two) times daily.     omeprazole (PRILOSEC) 20 MG capsule Take 40 mg by mouth daily.     psyllium (METAMUCIL) 58.6 % packet Take 1 packet by mouth daily as needed (constipation).     simethicone (MYLICON) 829 MG chewable tablet Chew 125 mg by mouth every 6 (six) hours as needed for flatulence.     TOVIAZ 8 MG TB24 tablet Take 8 mg by mouth daily.     No current facility-administered medications for this visit.    Allergies as of 12/13/2021 - Review Complete 12/13/2021  Allergen Reaction Noted   Ibuprofen Other (See Comments) 05/30/2021   Nsaids Other (See Comments) 01/17/2016   Tuberculin tests Dermatitis 01/18/2015    Family  History  Problem Relation Age of Onset   Diabetes Father    Heart disease Father    Hypertension Paternal Aunt    Heart disease Paternal Aunt    Seizures Paternal 34    Breast cancer Paternal Aunt    Heart disease Paternal 9    Depression Daughter    Hypertension Sister    Heart disease Sister    Depression Son    ADD / ADHD Son    Depression Daughter    Colon cancer Neg Hx    Colon polyps Neg Hx     Social History   Socioeconomic History   Marital status: Legally Separated    Spouse name: Not on file   Number of children: Not on file   Years of education: Not on file   Highest education level: Not on file  Occupational History   Occupation: disability  Tobacco Use   Smoking status: Never   Smokeless tobacco: Never  Vaping Use   Vaping Use: Never used  Substance and Sexual Activity   Alcohol use: No    Alcohol/week: 0.0 standard drinks   Drug use: No   Sexual activity: Not Currently    Birth control/protection: Surgical    Comment: tubal and ablation  Other Topics Concern   Not on file  Social History Narrative   Not on file   Social Determinants of Health   Financial Resource Strain: Not on file  Food Insecurity: Not on file  Transportation Needs: Not on file  Physical Activity: Not on file  Stress: Not on file  Social Connections: Not on file  Intimate Partner Violence: Not on file     Review of Systems   Gen: Denies any fever, chills, fatigue, weight loss, lack of appetite.  CV: Denies chest pain, heart palpitations, peripheral edema, syncope.  Resp: Denies shortness of breath at rest or with exertion. Denies wheezing or cough.  GI: Denies dysphagia or odynophagia. Denies jaundice, hematemesis, fecal incontinence. GU : Denies urinary burning, urinary frequency, urinary hesitancy MS: Denies joint pain, muscle weakness, cramps, or limitation of movement.  Derm: Denies rash, itching, dry skin Psych: Denies depression, anxiety, memory  loss, and confusion Heme: Denies bruising, bleeding, and enlarged lymph nodes.   Physical Exam   BP 112/68   Pulse (!) 58   Temp 98.1 F (36.7 C)   Ht '5\' 4"'$  (1.626 m)   Wt 203 lb 6.4 oz (92.3 kg)   LMP  (LMP Unknown)   BMI 34.91 kg/m  General:   Alert and oriented. Pleasant and cooperative. Well-nourished and well-developed.  Head:  Normocephalic and atraumatic. Eyes:  Without icterus Abdomen:  +BS, soft, non-tender and non-distended. No HSM noted. No guarding or  rebound. No masses appreciated.  Rectal:  Deferred  Msk:  Symmetrical without gross deformities. Normal posture. Extremities:  Without edema. Neurologic:  Alert and  oriented x4;  grossly normal neurologically. Skin:  Intact without significant lesions or rashes. Psych:  Alert and cooperative. Normal mood and affect.   Assessment   Dawn Barnes is a 56 y.o. female presenting today in follow-up with a history of dysphagia likely secondary to web and/or poor dentition, chronic constipation and GERD. Colonoscopy with mild proctitis in setting of NSAIDs 2017. Edentulous. Last EGD in May 2017.  GERD remains controlled on PPI daily.  Constipation responding to Linzess 290 mcg and Metamucil.      PLAN    Continue omeprazole daily Linzess 290 mcg daily, Metamucil, and increase water Return in 1 year or sooner if needed   Annitta Needs, PhD, ANP-BC [facility]

## 2021-12-13 NOTE — Patient Instructions (Signed)
Continue omeprazole daily! ? ?Continue Linzess daily. You can take Metamucil every day. ? ?We will see you in 1 year! ? ?Please call with any concerns in meantime! ? ?I enjoyed seeing you again today! As you know, I value our relationship and want to provide genuine, compassionate, and quality care. I welcome your feedback. If you receive a survey regarding your visit,  I greatly appreciate you taking time to fill this out. See you next time! ? ?Annitta Needs, PhD, ANP-BC ?[facility]  ? ?

## 2021-12-27 ENCOUNTER — Ambulatory Visit (INDEPENDENT_AMBULATORY_CARE_PROVIDER_SITE_OTHER): Payer: Medicaid Other | Admitting: Orthopedic Surgery

## 2021-12-27 ENCOUNTER — Encounter: Payer: Self-pay | Admitting: Orthopedic Surgery

## 2021-12-27 DIAGNOSIS — S838X2D Sprain of other specified parts of left knee, subsequent encounter: Secondary | ICD-10-CM

## 2021-12-27 NOTE — Progress Notes (Signed)
Orthopaedic Postop Note  Assessment: Dawn Barnes is a 56 y.o. female s/p left knee arthroscopy with partial medial meniscectomy  DOS: 11/17/21  Plan: She is doing well.  No pain in her left knee.  She has full range of motion.  Her knee feels a lot better now than it did prior to surgery.  She is not taking any medications.  Continue to work on strengthening and recovery following surgery.  Follow-up as needed.  Follow-up: Return if symptoms worsen or fail to improve. XR at next visit: No XR  Subjective:  Chief Complaint  Patient presents with   Routine Post Op    S/p LT knee scope DOS 11/17/21    History of Present Illness: Dawn Barnes is a 56 y.o. female who presents following the above stated procedure.  Surgery was a 6 weeks ago.  She continues to do well.  She is not taking pain medications.  She has full range of motion.  She feels a lot better than she did prior to surgery.  She wants to know when she can submerge her left knee.  Review of Systems: No fevers or chills No numbness or tingling No Chest Pain No shortness of breath   Objective: There were no vitals taken for this visit.  Physical Exam:  No swelling in her left knee.  Surgical incisions are healing well.  No surrounding erythema or drainage.  No tenderness to palpation along the medial lateral joint line.  Range of motion is from 0-120 degrees without discomfort.  IMAGING: I personally ordered and reviewed the following images:  No new imaging obtained today  Mordecai Rasmussen, MD 12/27/2021 10:42 PM

## 2022-02-28 ENCOUNTER — Other Ambulatory Visit: Payer: Self-pay | Admitting: Nurse Practitioner

## 2022-02-28 DIAGNOSIS — E038 Other specified hypothyroidism: Secondary | ICD-10-CM

## 2022-04-04 ENCOUNTER — Ambulatory Visit: Payer: Medicaid Other | Admitting: Nurse Practitioner

## 2022-04-06 LAB — TSH: TSH: 1.14 u[IU]/mL (ref 0.450–4.500)

## 2022-04-06 LAB — T4, FREE: Free T4: 1.24 ng/dL (ref 0.82–1.77)

## 2022-04-12 ENCOUNTER — Other Ambulatory Visit (HOSPITAL_COMMUNITY): Payer: Self-pay | Admitting: Neurology

## 2022-04-12 ENCOUNTER — Other Ambulatory Visit: Payer: Self-pay | Admitting: Neurology

## 2022-04-12 DIAGNOSIS — G35 Multiple sclerosis: Secondary | ICD-10-CM

## 2022-04-13 ENCOUNTER — Encounter: Payer: Self-pay | Admitting: Nurse Practitioner

## 2022-04-13 ENCOUNTER — Ambulatory Visit (INDEPENDENT_AMBULATORY_CARE_PROVIDER_SITE_OTHER): Payer: Medicaid Other | Admitting: Nurse Practitioner

## 2022-04-13 VITALS — BP 108/74 | HR 69 | Ht 64.0 in | Wt 204.2 lb

## 2022-04-13 DIAGNOSIS — E038 Other specified hypothyroidism: Secondary | ICD-10-CM

## 2022-04-13 DIAGNOSIS — E559 Vitamin D deficiency, unspecified: Secondary | ICD-10-CM | POA: Diagnosis not present

## 2022-04-13 DIAGNOSIS — R7303 Prediabetes: Secondary | ICD-10-CM | POA: Diagnosis not present

## 2022-04-13 MED ORDER — LEVOTHYROXINE SODIUM 75 MCG PO TABS: 75.0000 ug | ORAL_TABLET | Freq: Every day | ORAL | 3 refills | Status: DC

## 2022-04-13 NOTE — Patient Instructions (Signed)

## 2022-04-13 NOTE — Progress Notes (Addendum)
04/13/2022  Endocrinology follow-up note    Subjective:    Patient ID: Dawn Barnes, female    DOB: [birth year], PCP Dawn Meiers, MD   Past Medical History:  Diagnosis Date   Anemia    Anxiety    Constipation 04/30/2013   Depression    Diabetes mellitus without complication ([place])    borderline   Fibroids, intramural 01/21/2016   GERD (gastroesophageal reflux disease)    Heartburn    Hot flashes 01/11/2016   Hypothyroidism    Mixed incontinence    MS (multiple sclerosis) (HCC)    Pancreatitis    PONV (postoperative nausea and vomiting)    PTSD (post-traumatic stress disorder)    Thyroid disease    Urge urinary incontinence 05/10/2015   Vaginal bleeding 01/11/2016   Past Surgical History:  Procedure Laterality Date   BIOPSY  12/07/2015   Procedure: BIOPSY;  Surgeon: Danie Binder, MD;  Location: [facility];  Service: Endoscopy;;  right and left colon biopsies, gastric bx's    COLONOSCOPY WITH PROPOFOL N/A 12/07/2015   Dr. Oneida Alar: mild proctitis, hyperplastic polyp    ENDOMETRIAL ABLATION     ESOPHAGOGASTRODUODENOSCOPY (EGD) WITH PROPOFOL N/A 12/07/2015   Dr. 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    INCONTINENCE SURGERY     KNEE ARTHROSCOPY WITH MEDIAL MENISECTOMY Left 11/17/2021   Procedure: KNEE ARTHROSCOPY WITH MEDIAL MENISECTOMY;  Surgeon: Mordecai Rasmussen, MD;  Location: [facility];  Service: Orthopedics;  Laterality: Left;   SAVORY DILATION N/A 12/07/2015   Procedure: SAVORY DILATION;  Surgeon: Danie Binder, MD;  Location: [facility];  Service: Endoscopy;  Laterality: N/A;   TUBAL LIGATION     Social History   Socioeconomic History   Marital status: Legally Separated    Spouse name: Not on file   Number of children: Not on file   Years of education: Not on file   Highest education level: Not on file  Occupational History   Occupation: disability  Tobacco Use   Smoking status: Never    Smokeless tobacco: Never  Vaping Use   Vaping Use: Never used  Substance and Sexual Activity   Alcohol use: No    Alcohol/week: 0.0 standard drinks of alcohol   Drug use: No   Sexual activity: Not Currently    Birth control/protection: Surgical    Comment: tubal and ablation  Other Topics Concern   Not on file  Social History Narrative   Not on file   Social Determinants of Health   Financial Resource Strain: Not on file  Food Insecurity: Not on file  Transportation Needs: Not on file  Physical Activity: Not on file  Stress: Not on file  Social Connections: Not on file   Outpatient Encounter Medications as of 04/13/2022  Medication Sig   cetirizine (ZYRTEC) 10 MG tablet Take 10 mg by mouth daily.   Dimethyl Fumarate 240 MG CPDR Take 240 mg by mouth 2 (two) times daily.   escitalopram (LEXAPRO) 20 MG tablet Take 20 mg by mouth daily.   gabapentin (NEURONTIN) 600 MG tablet Take 600 mg by mouth 2 (two) times daily.   hydrochlorothiazide (HYDRODIURIL) 12.5 MG tablet Take 12.5 mg by mouth daily.   LINZESS 290 MCG CAPS capsule TAKE 1 CAPSULE BY MOUTH DAILY BEFORE BREAKFAST.   meclizine (ANTIVERT) 25 MG tablet Take 25 mg by mouth 3 (three) times daily as needed for dizziness.   methocarbamol (ROBAXIN) 500 MG tablet Take  500 mg by mouth 2 (two) times daily.   omeprazole (PRILOSEC) 20 MG capsule Take 40 mg by mouth daily.   psyllium (METAMUCIL) 58.6 % packet Take 1 packet by mouth daily as needed (constipation).   TOVIAZ 8 MG TB24 tablet Take 8 mg by mouth daily.   [DISCONTINUED] levothyroxine (SYNTHROID) 75 MCG tablet TAKE 1 TABLET BY MOUTH DAILY BEFORE BREAKFAST.   levothyroxine (SYNTHROID) 75 MCG tablet Take 1 tablet (75 mcg total) by mouth daily before breakfast.   No facility-administered encounter medications on file as of 04/13/2022.   ALLERGIES: Allergies  Allergen Reactions   Ibuprofen Other (See Comments)   Nsaids Other (See Comments)    Pt states MD told her not to take  due to stomach irritation   Tuberculin Tests Dermatitis   VACCINATION STATUS: Immunization History  Administered Date(s) Administered   Influenza-Unspecified 04/30/2014, 03/15/2015    Thyroid Problem Presents for follow-up (patient with medical history of hypothyroidism from Hashimoto's thyroiditis. ) visit. Symptoms include fatigue. Patient reports no anxiety, cold intolerance, constipation, depressed mood, diarrhea, heat intolerance, palpitations, tremors, weight gain or weight loss. The symptoms have been stable.    She also has history of prediabetes not on medications.  She is also on ongoing supplement for vitamin D deficiency.   Review of systems  Constitutional: + Minimally fluctuating body weight,  current Body mass index is 35.05 kg/m. , + intermittent fatigue, mild subjective hyperthermia, no subjective hypothermia Eyes: no blurry vision, no xerophthalmia ENT: no sore throat, no nodules palpated in throat, no dysphagia/odynophagia, no hoarseness Cardiovascular: no chest pain, no shortness of breath, no palpitations, no leg swelling Respiratory: no cough, no shortness of breath Gastrointestinal: no nausea/vomiting/diarrhea Musculoskeletal: no muscle/joint aches, + loses balance at times due to MS, generalized weakness (usually in BLE) Skin: no rashes, no hyperemia Neurological: mild tremors, + numbness/tingling to neck and arm (having MRI soon to follow up on MS), no dizziness Psychiatric: no depression, no anxiety  Objective:    BP 108/74 (BP Location: Right Arm, Patient Position: Sitting, Cuff Size: Normal)   Pulse 69   Ht '5\' 4"'$  (1.626 m)   Wt 204 lb 3.2 oz (92.6 kg)   BMI 35.05 kg/m   Wt Readings from Last 3 Encounters:  04/13/22 204 lb 3.2 oz (92.6 kg)  12/13/21 203 lb 6.4 oz (92.3 kg)  11/29/21 202 lb (91.6 kg)     BP Readings from Last 3 Encounters:  04/13/22 108/74  12/13/21 112/68  11/29/21 116/82   Physical Exam- Limited  Constitutional:  Body  mass index is 35.05 kg/m. , not in acute distress, normal state of mind Eyes:  EOMI, no exophthalmos Neck: Supple Cardiovascular: RRR, no murmurs, rubs, or gallops, no edema Respiratory: Adequate breathing efforts, no crackles, rales, rhonchi, or wheezing Musculoskeletal: no gross deformities, strength intact in all four extremities, no gross restriction of joint movements Skin:  no rashes, no hyperemia Neurological: no tremor with outstretched hands   Recent Results (from the past 2160 hour(s))  TSH     Status: None   Collection Time: 04/05/22  1:01 PM  Result Value Ref Range   TSH 1.140 0.450 - 4.500 uIU/mL  T4, free     Status: None   Collection Time: 04/05/22  1:01 PM  Result Value Ref Range   Free T4 1.24 0.82 - 1.77 ng/dL    Latest Reference Range & Units 11/16/20 16:36 05/20/21 13:45 06/21/21 14:44 11/21/21 14:52 04/05/22 13:01  TSH 0.450 - 4.500 uIU/mL 0.333 (  L) 0.204 (L) 0.629 0.054 (L) 1.140  T4,Free(Direct) 0.82 - 1.77 ng/dL 1.39 1.27 1.00 1.87 (H) 1.24  (L): Data is abnormally low (H): Data is abnormally high  Assessment & Plan:   1. Hypothyroidism due to Hashimoto's thyroiditis -Her previsit thyroid function tests are consistent appropriate hormone replacement.  She is advised to continue Levothyroxine 75 mcg po daily before breakfast.    - We discussed about the correct intake of her thyroid hormone, on empty stomach at fasting, with water, separated by at least 30 minutes from breakfast and other medications,  and separated by more than 4 hours from calcium, iron, multivitamins, acid reflux medications (PPIs). -Patient is made aware of the fact that thyroid hormone replacement is needed for life, dose to be adjusted by periodic monitoring of thyroid function tests.  2. Pre-diabetes -Her most recent A1C was 5.6%, improving from last visit of  5.8%.  Still will not require any pharmacological intervention at this time.  Dietary adjustments will be her main focus as  she is limited with the exercises she can perform due to her MS diagnosis.  Continued weight loss will help.  - Nutritional counseling repeated at each appointment due to patients tendency to fall back in to old habits.  - The patient admits there is a room for improvement in their diet and drink choices. -  Suggestion is made for the patient to avoid simple carbohydrates from their diet including Cakes, Sweet Desserts / Pastries, Ice Cream, Soda (diet and regular), Sweet Tea, Candies, Chips, Cookies, Sweet Pastries, Store Bought Juices, Alcohol in Excess of 1-2 drinks a day, Artificial Sweeteners, Coffee Creamer, and "Sugar-free" Products. This will help patient to have stable blood glucose profile and potentially avoid unintended weight gain.   - I encouraged the patient to switch to unprocessed or minimally processed complex starch and increased protein intake (animal or plant source), fruits, and vegetables.   - Patient is advised to stick to a routine mealtimes to eat 3 meals a day and avoid unnecessary snacks (to snack only to correct hypoglycemia).   3.  Vitamin D deficiency- Her most recent vitamin D level was 84.4 on 11/21/21.   She can stop her OTC vitamin D supplement at this time.  Will recheck vitamin D level prior to next visit.   - I advised patient to maintain close follow up with Dawn Meiers, MD for primary care needs.    I spent 20 minutes in the care of the patient today including review of labs from Thyroid Function, CMP, and other relevant labs ; imaging/biopsy records (current and previous including abstractions from other facilities); face-to-face time discussing  her lab results and symptoms, medications doses, her options of short and long term treatment based on the latest standards of care / guidelines;   and documenting the encounter.  Dawn Barnes  participated in the discussions, expressed understanding, and voiced agreement with the above plans.  All  questions were answered to her satisfaction. she is encouraged to contact clinic should she have any questions or concerns prior to her return visit.    Follow up plan: Return in about 4 months (around 08/13/2022) for Thyroid follow up, Previsit labs, Bring meter and logs.  Rayetta Pigg, [facility] [address] [place], [place] [address] Phone: [phone] Fax: [phone]  04/13/2022, 3:55 PM

## 2022-05-02 ENCOUNTER — Ambulatory Visit (HOSPITAL_COMMUNITY)
Admission: RE | Admit: 2022-05-02 | Discharge: 2022-05-02 | Disposition: A | Discharge status: Home / Self Care | Payer: Medicaid Other | Source: Ambulatory Visit | Attending: Neurology | Admitting: Neurology

## 2022-05-02 DIAGNOSIS — G35 Multiple sclerosis: Secondary | ICD-10-CM | POA: Insufficient documentation

## 2022-05-02 MED ORDER — GADOPICLENOL 0.5 MMOL/ML IV SOLN
7.0000 mL | Freq: Once | INTRAVENOUS | Status: AC | PRN
  Administered 2022-05-02: 7 mL via INTRAVENOUS

## 2022-05-08 ENCOUNTER — Encounter: Payer: Self-pay | Admitting: Neurology

## 2022-05-26 ENCOUNTER — Ambulatory Visit (INDEPENDENT_AMBULATORY_CARE_PROVIDER_SITE_OTHER): Payer: Medicaid Other | Admitting: Orthopedic Surgery

## 2022-05-26 ENCOUNTER — Ambulatory Visit (INDEPENDENT_AMBULATORY_CARE_PROVIDER_SITE_OTHER): Payer: Medicaid Other

## 2022-05-26 ENCOUNTER — Encounter: Payer: Self-pay | Admitting: Orthopedic Surgery

## 2022-05-26 VITALS — BP 126/81 | HR 63 | Ht 64.0 in | Wt 206.0 lb

## 2022-05-26 DIAGNOSIS — M25571 Pain in right ankle and joints of right foot: Secondary | ICD-10-CM

## 2022-05-26 DIAGNOSIS — R32 Unspecified urinary incontinence: Secondary | ICD-10-CM | POA: Insufficient documentation

## 2022-05-26 DIAGNOSIS — G8929 Other chronic pain: Secondary | ICD-10-CM

## 2022-05-26 DIAGNOSIS — M25572 Pain in left ankle and joints of left foot: Secondary | ICD-10-CM

## 2022-05-26 DIAGNOSIS — R7303 Prediabetes: Secondary | ICD-10-CM | POA: Insufficient documentation

## 2022-05-26 DIAGNOSIS — M1990 Unspecified osteoarthritis, unspecified site: Secondary | ICD-10-CM | POA: Insufficient documentation

## 2022-05-26 NOTE — Patient Instructions (Signed)
Consider creams, sprays or patches to help with the pain.  These are available over the counter counter at the pharmacy  Wear shoes that provide enough support for your foot and ankle  Complete the following exercises for both ankles.    Instructions  1.  You have sustained an ankle sprain, or similar exercises that can be treated as an ankle sprain.  **These exercises can also be used as part of recovery from an ankle fracture.  2.  I encourage you to stay on your feet and gradually remove your walking boot.   3.  Below are some exercises that you can complete on your own to improve your symptoms.  4.  As an alternative, you can search for ankle sprain exercises online, and can see some demonstrations on YouTube  5.  If you are having difficulty with these exercises, we can also prescribe formal physical therapy  Ankle Exercises Ask your health care provider which exercises are safe for you. Do exercises exactly as told by your health care provider and adjust them as directed. It is normal to feel mild stretching, pulling, tightness, or mild discomfort as you do these exercises. Stop right away if you feel sudden pain or your pain gets worse. Do not begin these exercises until told by your health care provider.  Stretching and range-of-motion exercises These exercises warm up your muscles and joints and improve the movement and flexibility of your ankle. These exercises may also help to relieve pain.  Dorsiflexion/plantar flexion  Sit with your R knee straight or bent. Do not rest your foot on anything. Flex your left ankle to tilt the top of your foot toward your shin. This is called dorsiflexion. Hold this position for 5 seconds. Point your toes downward to tilt the top of your foot away from your shin. This is called plantar flexion. Hold this position for 5 seconds. Repeat 10 times. Complete this exercise 2-3 times a day.  As tolerated  Ankle alphabet  Sit with your R foot  supported at your lower leg. Do not rest your foot on anything. Make sure your foot has room to move freely. Think of your R foot as a paintbrush: Move your foot to trace each letter of the alphabet in the air. Keep your hip and knee still while you trace the letters. Trace every letter from A to Z. Make the letters as large as you can without causing or increasing any discomfort.  Repeat 2-3 times. Complete this exercise 2-3 times a day.   Strengthening exercises These exercises build strength and endurance in your ankle. Endurance is the ability to use your muscles for a long time, even after they get tired. Dorsiflexors These are muscles that lift your foot up. Secure a rubber exercise band or tube to an object, such as a table leg, that will stay still when the band is pulled. Secure the other end around your R foot. Sit on the floor, facing the object with your R leg extended. The band or tube should be slightly tense when your foot is relaxed. Slowly flex your R ankle and toes to bring your foot toward your shin. Hold this position for 5 seconds. Slowly return your foot to the starting position, controlling the band as you do that. Repeat 10 times. Complete this exercise 2-3 times a day.  Plantar flexors These are muscles that push your foot down. Sit on the floor with your R leg extended. Loop a rubber exercise band or  tube around the ball of your R foot. The ball of your foot is on the walking surface, right under your toes. The band or tube should be slightly tense when your foot is relaxed. Slowly point your toes downward, pushing them away from you. Hold this position for 5 seconds. Slowly release the tension in the band or tube, controlling smoothly until your foot is back in the starting position. Repeat 10 times. Complete this exercise 2-3 times a day.  Towel curls  Sit in a chair on a non-carpeted surface, and put your feet on the floor. Place a towel in front of your  feet. Keeping your heel on the floor, put your R foot on the towel. Pull the towel toward you by grabbing the towel with your toes and curling them under. Keep your heel on the floor. Let your toes relax. Grab the towel again. Keep pulling the towel until it is completely underneath your foot. Repeat 10 times. Complete this exercise 2-3 times a day.  Standing plantar flexion This is an exercise in which you use your toes to lift your body's weight while standing. Stand with your feet shoulder-width apart. Keep your weight spread evenly over the width of your feet while you rise up on your toes. Use a wall or table to steady yourself if needed, but try not to use it for support. If this exercise is too easy, try these options: Shift your weight toward your R leg until you feel challenged. If told by your health care provider, lift your uninjured leg off the floor. Hold this position for 5 seconds. Repeat 10 times. Complete this exercise 2-3 times a day.  Tandem walking Stand with one foot directly in front of the other. Slowly raise your back foot up, lifting your heel before your toes, and place it directly in front of your other foot. Continue to walk in this heel-to-toe way. Have a countertop or wall nearby to use if needed to keep your balance, but try not to hold onto anything for support.  Repeat 10 times. Complete this exercise 2-3 times a day.   Document Revised: 04/13/2018 Document Reviewed: 04/15/2018 Elsevier Patient Education  [place].

## 2022-05-27 ENCOUNTER — Encounter: Payer: Self-pay | Admitting: Orthopedic Surgery

## 2022-05-27 NOTE — Progress Notes (Signed)
Orthopaedic Clinic Return  Assessment: Dawn Barnes is a 56 y.o. female with the following: Bilateral ankle pain, atraumatic  Plan: [name] has bilateral ankle pain.  No injury.  Pain is in the lateral part of her ankle and foot.  Radiographs are negative.  Recommend over-the-counter medications, including creams, lotions, sprays and patches.  She cannot take NSAIDs.  She is also wearing worn-out crocs.  I recommended foot wear with more stability.  I provided her with ankle exercises to complete on her own.  Follow-up as needed.  Body mass index is 35.36 kg/m.  Follow-up: Return if symptoms worsen or fail to improve.   Subjective:  Chief Complaint  Patient presents with   Ankle Pain    Bilateral ankle pain had injury / wearing crocs/ States no recent injury twisted ankles when she was 56 years old also has history of MS     History of Present Illness: Dawn Barnes is a 56 y.o. female who returns to clinic for evaluation of bilateral ankle pain.  She is well-known to my practice.  She is status post knee arthroscopy, and doing well.  She has started to develop some bilateral ankle pain.  No specific injury.  Remote history of injuries.  Pain is in the anterior lateral aspect of the ankle into the midfoot.  She can only take Tylenol, which is not providing her with sufficient relief.  No exercises or physical therapy.  Her current pair of crocs, which she states she wears most of the time are greater than 6 months old.  Review of Systems: No fevers or chills No numbness or tingling No chest pain No shortness of breath No bowel or bladder dysfunction No GI distress No headaches   Objective: BP 126/81   Pulse 63   Ht '5\' 4"'$  (1.626 m)   Wt 206 lb (93.4 kg)   BMI 35.36 kg/m   Physical Exam:  Alert and oriented.  No acute distress.  Walks with a nonantalgic gait.  Bilateral ankle without swelling.  No bruising is appreciated.  Tenderness to palpation over the anterior  lateral aspect of the both ankles.  No discomfort with inversion of the ankles.  Toes warm and well-perfused.  Skin is relatively dry.  IMAGING: I personally ordered and reviewed the following images:  X-rays of bilateral ankles were obtained in clinic today.  No acute injuries are noted.  Mortise is congruent bilaterally.  No syndesmotic disruption.  Possible evidence of remote injuries to the lateral ankle.  No soft tissue swelling.  Impression: Negative bilateral ankle x-rays.   Mordecai Rasmussen, MD 05/27/2022 9:16 AM

## 2022-06-30 ENCOUNTER — Other Ambulatory Visit (HOSPITAL_COMMUNITY): Payer: Self-pay | Admitting: Internal Medicine

## 2022-06-30 DIAGNOSIS — Z1231 Encounter for screening mammogram for malignant neoplasm of breast: Secondary | ICD-10-CM

## 2022-07-11 IMAGING — MG DIGITAL SCREENING BILAT W/ TOMO W/ CAD
8 series · 8 of 24 positions shown · non-contrast
Comparison: Previous exam(s).

CLINICAL DATA: Screening.

EXAM:
DIGITAL SCREENING BILATERAL MAMMOGRAM WITH TOMO AND CAD

[R MLO synth-2D]
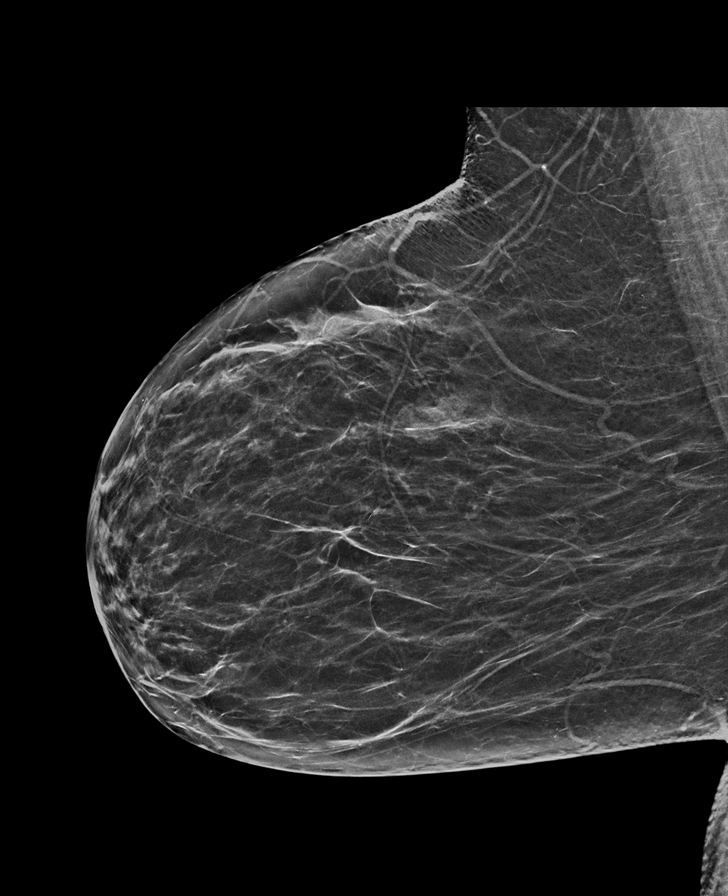

[L MLO synth-2D]
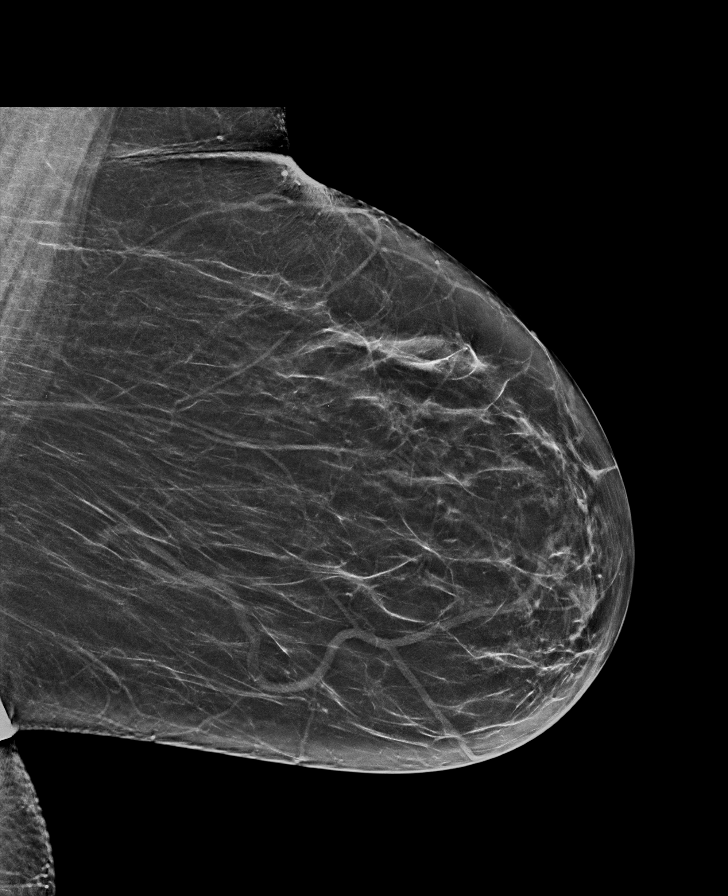

[L CC synth-2D]
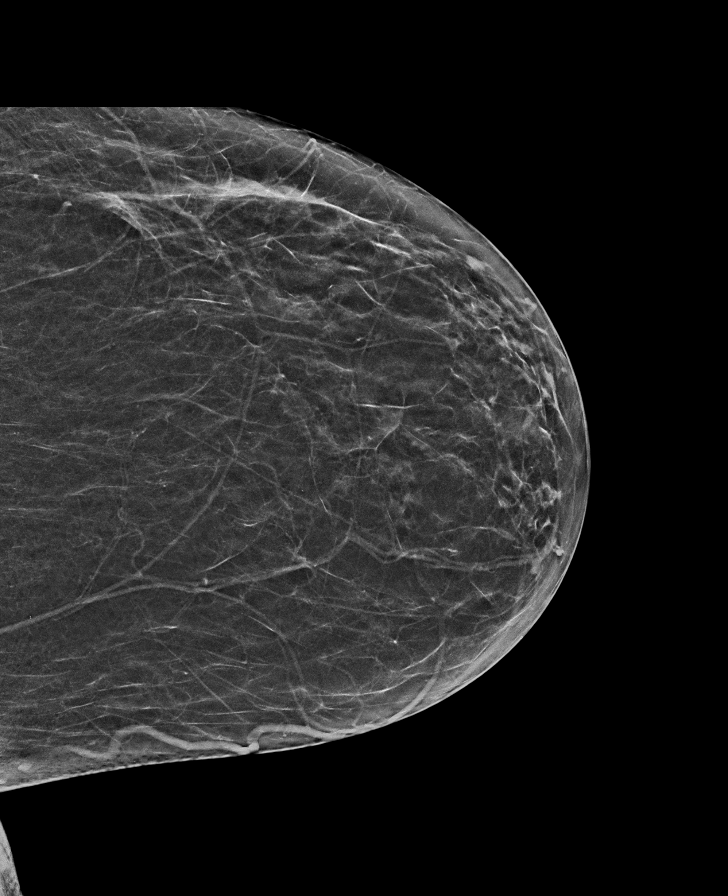

[R CC synth-2D]
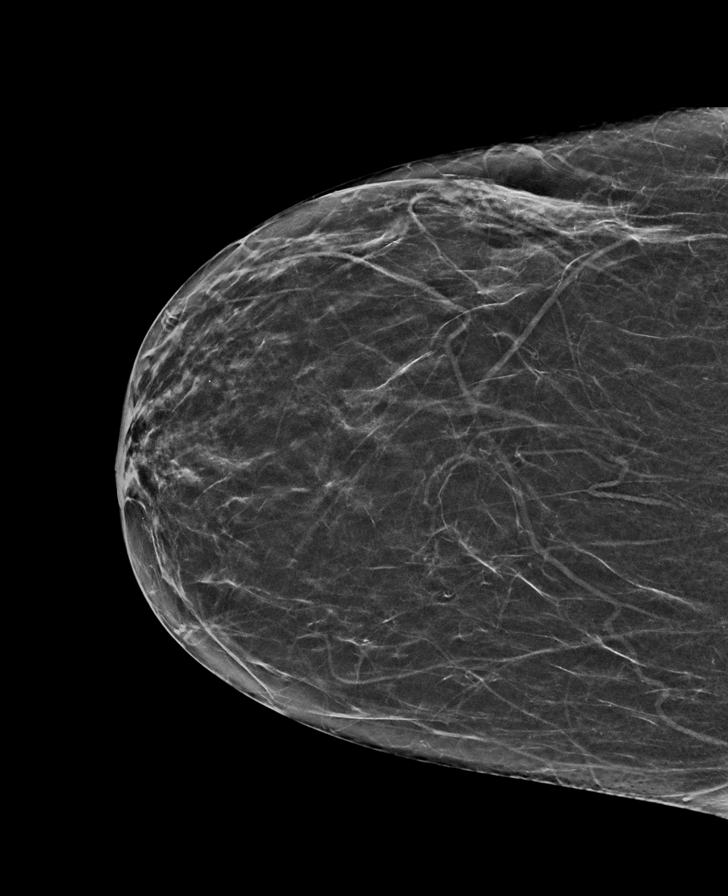

[L MLO tomo · tomo slice 33/66.0]
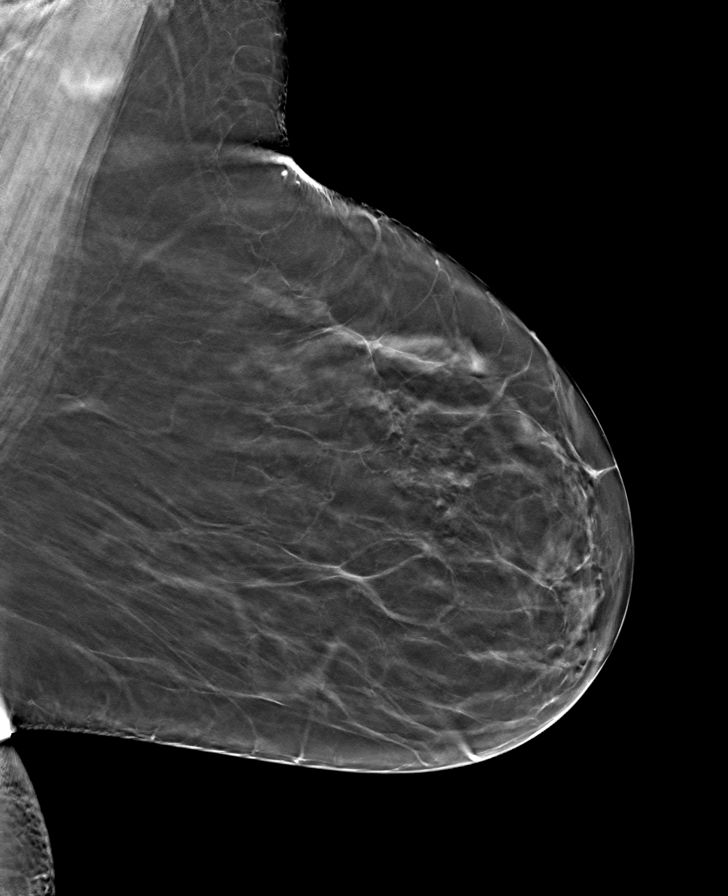

[R MLO tomo · tomo slice 33/65.0]
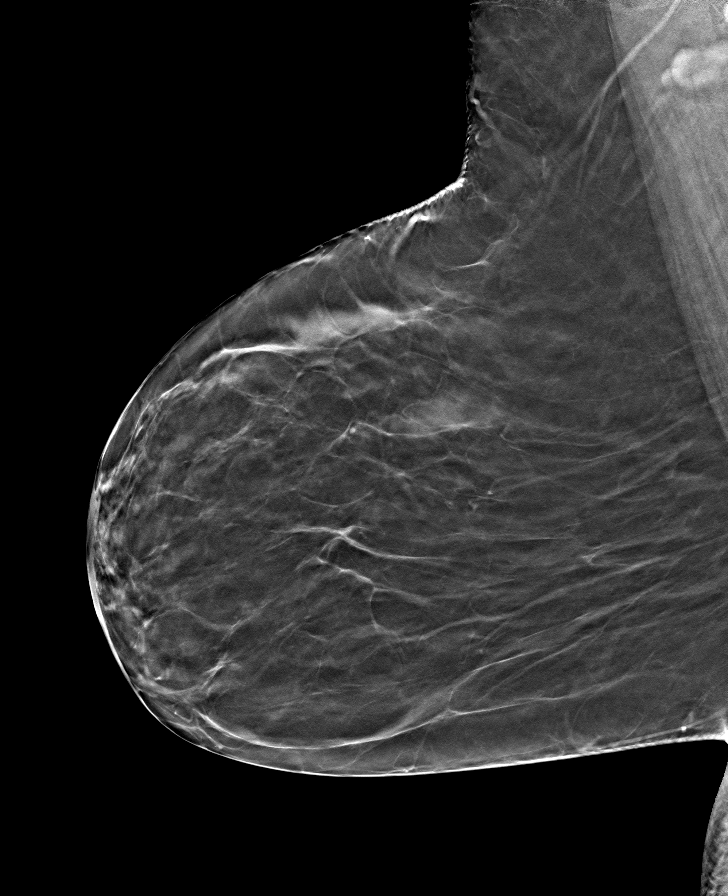

[R CC tomo · tomo slice 29/56.0]
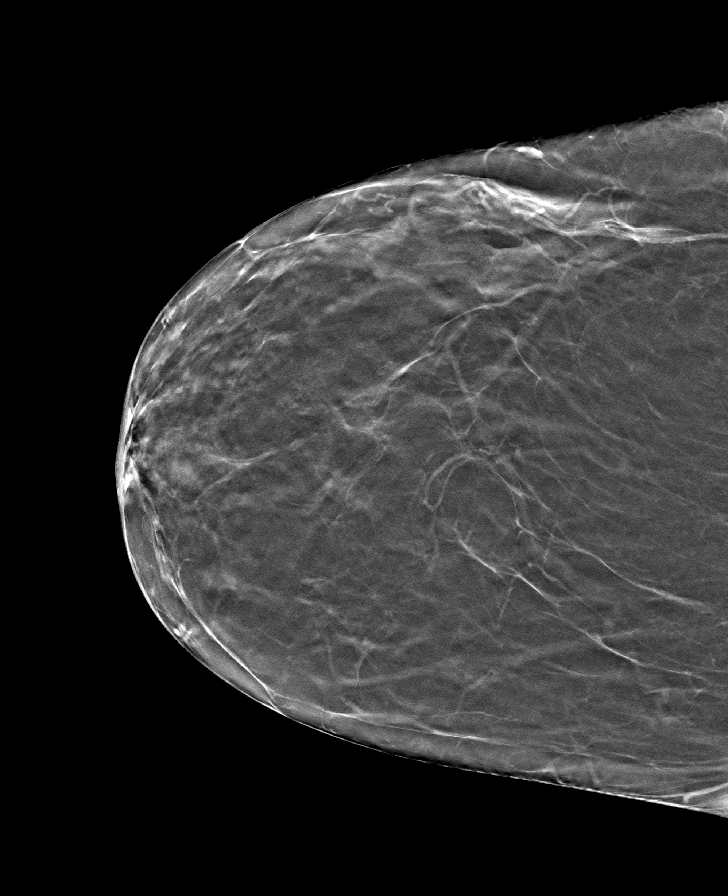

[L CC tomo · tomo slice 29/57.0]
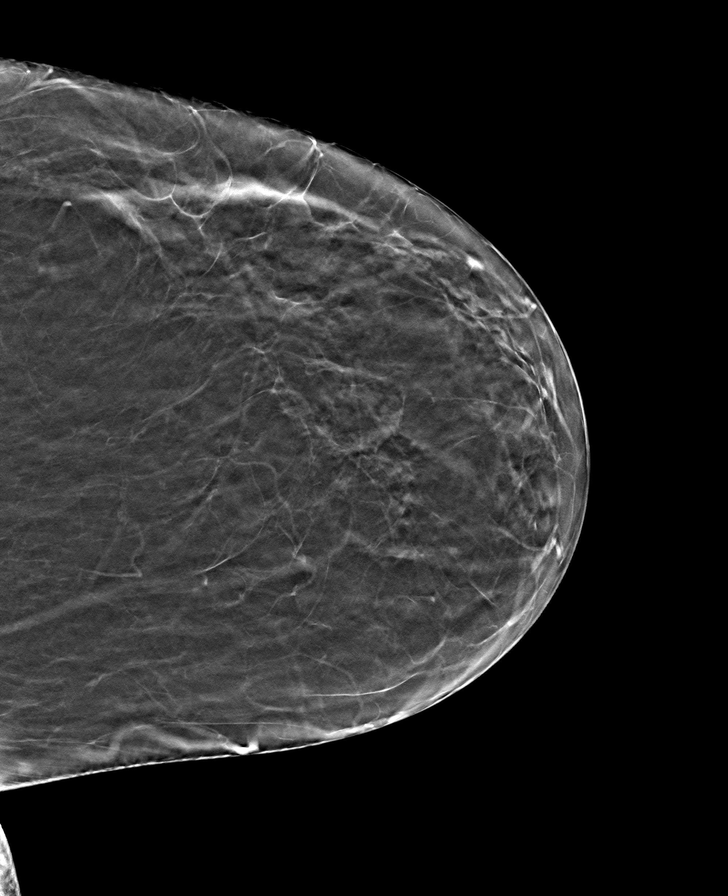

[8 of 24 positions shown; findings below may reference images not displayed]

ACR Breast Density Category b: There are scattered areas of
fibroglandular density.
FINDINGS: There are no findings suspicious for malignancy. Images were
processed with CAD.
IMPRESSION: No mammographic evidence of malignancy. A result letter of this
screening mammogram will be mailed directly to the patient.

RECOMMENDATION:
Screening mammogram in one year. (Code:[id number])

BI-RADS CATEGORY  1: Negative.

## 2022-08-03 ENCOUNTER — Ambulatory Visit (HOSPITAL_COMMUNITY): Payer: Medicaid Other

## 2022-08-07 ENCOUNTER — Ambulatory Visit (HOSPITAL_COMMUNITY)
Admission: RE | Admit: 2022-08-07 | Discharge: 2022-08-07 | Disposition: A | Discharge status: Home / Self Care | Payer: Medicaid Other | Source: Ambulatory Visit | Attending: Internal Medicine | Admitting: Internal Medicine

## 2022-08-07 DIAGNOSIS — Z1231 Encounter for screening mammogram for malignant neoplasm of breast: Secondary | ICD-10-CM | POA: Diagnosis present

## 2022-08-08 LAB — COMPREHENSIVE METABOLIC PANEL
ALT: 8 IU/L (ref 0–32)
AST: 12 IU/L (ref 0–40)
Albumin/Globulin Ratio: 1.7 (ref 1.2–2.2)
Albumin: 4.3 g/dL (ref 3.8–4.9)
Alkaline Phosphatase: 72 IU/L (ref 44–121)
BUN/Creatinine Ratio: 16 (ref 9–23)
BUN: 11 mg/dL (ref 6–24)
Bilirubin Total: 0.3 mg/dL (ref 0.0–1.2)
CO2: 24 mmol/L (ref 20–29)
Calcium: 9.7 mg/dL (ref 8.7–10.2)
Chloride: 105 mmol/L (ref 96–106)
Creatinine, Ser: 0.7 mg/dL (ref 0.57–1.00)
Globulin, Total: 2.6 g/dL (ref 1.5–4.5)
Glucose: 98 mg/dL (ref 70–99)
Potassium: 4.3 mmol/L (ref 3.5–5.2)
Sodium: 144 mmol/L (ref 134–144)
Total Protein: 6.9 g/dL (ref 6.0–8.5)
eGFR: 102 mL/min/{1.73_m2} (ref >59)

## 2022-08-08 LAB — VITAMIN D 25 HYDROXY (VIT D DEFICIENCY, FRACTURES): Vit D, 25-Hydroxy: 28.8 ng/mL — ABNORMAL LOW (ref 30.0–100.0)

## 2022-08-08 LAB — T4, FREE: Free T4: 1.2 ng/dL (ref 0.82–1.77)

## 2022-08-08 LAB — TSH: TSH: 1.01 u[IU]/mL (ref 0.450–4.500)

## 2022-08-11 NOTE — Patient Instructions (Signed)

## 2022-08-14 ENCOUNTER — Ambulatory Visit (INDEPENDENT_AMBULATORY_CARE_PROVIDER_SITE_OTHER): Payer: Medicaid Other | Admitting: Nurse Practitioner

## 2022-08-14 ENCOUNTER — Encounter: Payer: Self-pay | Admitting: Nurse Practitioner

## 2022-08-14 VITALS — BP 123/73 | HR 67 | Ht 64.0 in | Wt 211.4 lb

## 2022-08-14 DIAGNOSIS — R7303 Prediabetes: Secondary | ICD-10-CM

## 2022-08-14 DIAGNOSIS — E559 Vitamin D deficiency, unspecified: Secondary | ICD-10-CM | POA: Diagnosis not present

## 2022-08-14 DIAGNOSIS — E038 Other specified hypothyroidism: Secondary | ICD-10-CM | POA: Diagnosis not present

## 2022-08-14 LAB — POCT GLYCOSYLATED HEMOGLOBIN (HGB A1C): Hemoglobin A1C: 5.5 % (ref 4.0–5.6)

## 2022-08-14 NOTE — Progress Notes (Signed)
08/14/2022  Endocrinology follow-up note    Subjective:    Patient ID: Dawn Barnes, female    DOB: Oct 04, 1965, PCP Carrolyn Meiers, MD   Past Medical History:  Diagnosis Date   Anemia    Anxiety    Constipation 04/30/2013   Depression    Diabetes mellitus without complication ([place])    borderline   Fibroids, intramural 01/21/2016   GERD (gastroesophageal reflux disease)    Heartburn    Hot flashes 01/11/2016   Hypothyroidism    Mixed incontinence    MS (multiple sclerosis) (HCC)    Pancreatitis    PONV (postoperative nausea and vomiting)    PTSD (post-traumatic stress disorder)    Thyroid disease    Urge urinary incontinence 05/10/2015   Vaginal bleeding 01/11/2016   Past Surgical History:  Procedure Laterality Date   BIOPSY  12/07/2015   Procedure: BIOPSY;  Surgeon: Danie Binder, MD;  [facility];  Service: Endoscopy;;  right and left colon biopsies, gastric bx's    COLONOSCOPY WITH PROPOFOL N/A 12/07/2015   Dr. Oneida Alar: mild proctitis, hyperplastic polyp    ENDOMETRIAL ABLATION     ESOPHAGOGASTRODUODENOSCOPY (EGD) WITH PROPOFOL N/A 12/07/2015   Dr. 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    INCONTINENCE SURGERY     KNEE ARTHROSCOPY WITH MEDIAL MENISECTOMY Left 11/17/2021   Procedure: KNEE ARTHROSCOPY WITH MEDIAL MENISECTOMY;  Surgeon: Mordecai Rasmussen, MD;  Location: [facility];  Service: Orthopedics;  Laterality: Left;   SAVORY DILATION N/A 12/07/2015   Procedure: SAVORY DILATION;  Surgeon: Danie Binder, MD;  Location[facility];  Service: Endoscopy;  Laterality: N/A;   TUBAL LIGATION     Social History   Socioeconomic History   Marital status: Legally Separated    Spouse name: Not on file   Number of children: Not on file   Years of education: Not on file   Highest education level: Not on file  Occupational History   Occupation: disability  Tobacco Use   Smoking status: Never    Smokeless tobacco: Never  Vaping Use   Vaping Use: Never used  Substance and Sexual Activity   Alcohol use: No    Alcohol/week: 0.0 standard drinks of alcohol   Drug use: No   Sexual activity: Not Currently    Birth control/protection: Surgical    Comment: tubal and ablation  Other Topics Concern   Not on file  Social History Narrative   Not on file   Social Determinants of Health   Financial Resource Strain: Not on file  Food Insecurity: Not on file  Transportation Needs: Not on file  Physical Activity: Not on file  Stress: Not on file  Social Connections: Not on file   Outpatient Encounter Medications as of 08/14/2022  Medication Sig   cetirizine (ZYRTEC) 10 MG tablet Take 10 mg by mouth daily.   Dimethyl Fumarate 240 MG CPDR Take 240 mg by mouth 2 (two) times daily.   escitalopram (LEXAPRO) 20 MG tablet Take 20 mg by mouth daily.   gabapentin (NEURONTIN) 600 MG tablet Take 600 mg by mouth 2 (two) times daily.   hydrochlorothiazide (HYDRODIURIL) 12.5 MG tablet Take 12.5 mg by mouth daily.   levothyroxine (SYNTHROID) 75 MCG tablet Take 1 tablet (75 mcg total) by mouth daily before breakfast.   LINZESS 290 MCG CAPS capsule TAKE 1 CAPSULE BY MOUTH DAILY BEFORE BREAKFAST.   methocarbamol (ROBAXIN) 500 MG tablet Take 500 mg  by mouth 2 (two) times daily.   omeprazole (PRILOSEC) 20 MG capsule Take 40 mg by mouth daily.   psyllium (METAMUCIL) 58.6 % packet Take 1 packet by mouth daily as needed (constipation).   TOVIAZ 8 MG TB24 tablet Take 8 mg by mouth daily.   meclizine (ANTIVERT) 25 MG tablet Take 25 mg by mouth 3 (three) times daily as needed for dizziness. (Patient not taking: Reported on 05/26/2022)   No facility-administered encounter medications on file as of 08/14/2022.   ALLERGIES: Allergies  Allergen Reactions   Ibuprofen Other (See Comments)   Nsaids Other (See Comments)    Pt states MD told her not to take due to stomach irritation   Tuberculin Tests Dermatitis    VACCINATION STATUS: Immunization History  Administered Date(s) Administered   Influenza-Unspecified 04/30/2014, 03/15/2015    Thyroid Problem Presents for follow-up (patient with medical history of hypothyroidism from Hashimoto's thyroiditis. ) visit. Symptoms include fatigue. Patient reports no anxiety, cold intolerance, constipation, depressed mood, diarrhea, heat intolerance, palpitations, tremors, weight gain or weight loss. The symptoms have been stable.    She also has history of prediabetes not on medications.  She is also on ongoing supplement for vitamin D deficiency.   Review of systems  Constitutional: + steadily increasing body weight,  current Body mass index is 36.29 kg/m. , + intermittent fatigue, mild subjective hyperthermia, no subjective hypothermia Eyes: no blurry vision, no xerophthalmia ENT: no sore throat, no nodules palpated in throat, no dysphagia/odynophagia, no hoarseness Cardiovascular: no chest pain, no shortness of breath, no palpitations, no leg swelling Respiratory: no cough, no shortness of breath Gastrointestinal: no nausea/vomiting/diarrhea Musculoskeletal: no muscle/joint aches, + loses balance at times due to MS, generalized weakness (usually in BLE) Skin: no rashes, no hyperemia Neurological: mild tremors, + numbness/tingling to neck and arm (having MRI soon to follow up on MS), no dizziness Psychiatric: no depression, no anxiety  Objective:    BP 123/73 (BP Location: Right Arm, Patient Position: Sitting, Cuff Size: Large)   Pulse 67   Ht '5\' 4"'$  (1.626 m)   Wt 211 lb 6.4 oz (95.9 kg)   BMI 36.29 kg/m   Wt Readings from Last 3 Encounters:  08/14/22 211 lb 6.4 oz (95.9 kg)  05/26/22 206 lb (93.4 kg)  04/13/22 204 lb 3.2 oz (92.6 kg)     BP Readings from Last 3 Encounters:  08/14/22 123/73  05/26/22 126/81  04/13/22 108/74    Physical Exam- Limited  Constitutional:  Body mass index is 36.29 kg/m. , not in acute distress, normal  state of mind Eyes:  EOMI, no exophthalmos Musculoskeletal: no gross deformities, strength intact in all four extremities, no gross restriction of joint movements Skin:  no rashes, no hyperemia Neurological: no tremor with outstretched hands   Recent Results (from the past 2160 hour(s))  TSH     Status: None   Collection Time: 08/07/22  1:26 PM  Result Value Ref Range   TSH 1.010 0.450 - 4.500 uIU/mL  T4, free     Status: None   Collection Time: 08/07/22  1:26 PM  Result Value Ref Range   Free T4 1.20 0.82 - 1.77 ng/dL  VITAMIN D 25 Hydroxy (Vit-D Deficiency, Fractures)     Status: Abnormal   Collection Time: 08/07/22  1:26 PM  Result Value Ref Range   Vit D, 25-Hydroxy 28.8 (L) 30.0 - 100.0 ng/mL    Comment: Vitamin D deficiency has been defined by the Institute of Medicine and an  Endocrine Society practice guideline as a level of serum 25-OH vitamin D less than 20 ng/mL (1,2). The Endocrine Society went on to further define vitamin D insufficiency as a level between 21 and 29 ng/mL (2). 1. IOM (Institute of Medicine). 2010. Dietary reference    intakes for calcium and D. [place]: The    [employer]. 2. Holick MF, Binkley [place], Bischoff-Ferrari HA, et al.    Evaluation, treatment, and prevention of vitamin D    deficiency: an Endocrine Society clinical practice    guideline. JCEM. 2011 Jul; 96(7):1911-30.   Comprehensive metabolic panel     Status: None   Collection Time: 08/07/22  1:26 PM  Result Value Ref Range   Glucose 98 70 - 99 mg/dL   BUN 11 6 - 24 mg/dL   Creatinine, Ser 0.70 0.57 - 1.00 mg/dL   eGFR 102 >59 mL/min/1.73   BUN/Creatinine Ratio 16 9 - 23   Sodium 144 134 - 144 mmol/L   Potassium 4.3 3.5 - 5.2 mmol/L   Chloride 105 96 - 106 mmol/L   CO2 24 20 - 29 mmol/L   Calcium 9.7 8.7 - 10.2 mg/dL   Total Protein 6.9 6.0 - 8.5 g/dL   Albumin 4.3 3.8 - 4.9 g/dL   Globulin, Total 2.6 1.5 - 4.5 g/dL   Albumin/Globulin Ratio 1.7 1.2 - 2.2    Bilirubin Total 0.3 0.0 - 1.2 mg/dL   Alkaline Phosphatase 72 44 - 121 IU/L   AST 12 0 - 40 IU/L   ALT 8 0 - 32 IU/L  HgB A1c     Status: Normal   Collection Time: 08/14/22  2:59 PM  Result Value Ref Range   Hemoglobin A1C 5.5 4.0 - 5.6 %   HbA1c POC (<> result, manual entry)     HbA1c, POC (prediabetic range)     HbA1c, POC (controlled diabetic range)      Latest Reference Range & Units 06/21/21 14:44 11/21/21 14:52 04/05/22 13:01 08/07/22 13:26  TSH 0.450 - 4.500 uIU/mL 0.629 0.054 (L) 1.140 1.010  T4,Free(Direct) 0.82 - 1.77 ng/dL 1.00 1.87 (H) 1.24 1.20  (L): Data is abnormally low (H): Data is abnormally high   Latest Reference Range & Units 03/07/18 15:27 07/11/19 14:04 05/18/20 14:30 11/16/20 16:36 11/21/21 14:52 08/07/22 13:26  Vitamin D, 25-Hydroxy 30.0 - 100.0 ng/mL 37 50 22 (L) 57.7 84.4 28.8 (L)  (L): Data is abnormally low  Assessment & Plan:   1. Hypothyroidism due to Hashimoto's thyroiditis -Her previsit thyroid function tests are consistent appropriate hormone replacement.  She is advised to continue Levothyroxine 75 mcg po daily before breakfast.    - We discussed about the correct intake of her thyroid hormone, on empty stomach at fasting, with water, separated by at least 30 minutes from breakfast and other medications,  and separated by more than 4 hours from calcium, iron, multivitamins, acid reflux medications (PPIs). -Patient is made aware of the fact that thyroid hormone replacement is needed for life, dose to be adjusted by periodic monitoring of thyroid function tests.  2. Pre-diabetes -Her POCT A1c today is 5.5%, improving from last visit of 5.6%.  Still will not require any pharmacological intervention at this time.  Dietary adjustments will be her main focus as she is limited with the exercises she can perform due to her MS diagnosis.  Weight loss will help.  - Nutritional counseling repeated at each appointment due to patients tendency to fall back in to  old  habits.  - The patient admits there is a room for improvement in their diet and drink choices. -  Suggestion is made for the patient to avoid simple carbohydrates from their diet including Cakes, Sweet Desserts / Pastries, Ice Cream, Soda (diet and regular), Sweet Tea, Candies, Chips, Cookies, Sweet Pastries, Store Bought Juices, Alcohol in Excess of 1-2 drinks a day, Artificial Sweeteners, Coffee Creamer, and "Sugar-free" Products. This will help patient to have stable blood glucose profile and potentially avoid unintended weight gain.   - I encouraged the patient to switch to unprocessed or minimally processed complex starch and increased protein intake (animal or plant source), fruits, and vegetables.   - Patient is advised to stick to a routine mealtimes to eat 3 meals a day and avoid unnecessary snacks (to snack only to correct hypoglycemia).  3.  Vitamin D deficiency- Her most recent vitamin D level was 28.8 on 08/07/22.  She will need to restart OTC Vitamin D3 5000 units daily.   - I advised patient to maintain close follow up with Carrolyn Meiers, MD for primary care needs.      I spent 21 minutes in the care of the patient today including review of labs from Thyroid Function, CMP, and other relevant labs ; imaging/biopsy records (current and previous including abstractions from other facilities); face-to-face time discussing  her lab results and symptoms, medications doses, her options of short and long term treatment based on the latest standards of care / guidelines;   and documenting the encounter.  Audelia Acton  participated in the discussions, expressed understanding, and voiced agreement with the above plans.  All questions were answered to her satisfaction. she is encouraged to contact clinic should she have any questions or concerns prior to her return visit.   Follow up plan: Return in about 6 months (around 02/12/2023) for Thyroid follow up, Previsit  labs.    Rayetta Pigg, [facility] [address] [place], [place] [address] Phone: [phone] Fax: [phone]  08/14/2022, 3:02 PM

## 2022-09-12 ENCOUNTER — Other Ambulatory Visit: Payer: Self-pay | Admitting: Otolaryngology

## 2022-09-12 DIAGNOSIS — J358 Other chronic diseases of tonsils and adenoids: Secondary | ICD-10-CM

## 2022-09-22 NOTE — Progress Notes (Signed)
NEUROLOGY CONSULTATION NOTE  Dawn Barnes MRN: [id number] DOB: 05-28-1966  Referring provider: Benetta Spar, MD Primary care provider: Benetta Spar, MD  Reason for consult:  multiple sclerosis  Assessment/Plan:   History of multiple sclerosis - based on available notes, imaging and history, she does not yet meet the criteria for multiple sclerosis.  Findings on brain MRI are nonspecific and may be accounted for by chronic small vessel ischemic changes.   Will order lumbar puncture to check CSF cell count, protein, glucose, gram stain, culture, cytology, IgG synthesis/index, oligoclonal bands. Continue dimethyl fumarate 240mg  twice daily for now.  Check CBC with diff, CMP Check B12 Follow up in 6 months or sooner if needed/pending test results.   Subjective:  Dawn Barnes is a 57 year old female with hypothyroidism, PTSD, depression/anxiety and chronic pain who presents to establish care regarding multiple sclerosis.  History supplemented by prior neurologist's note.  Patient's previous neurologist has retired and thus, needs to establish care with another neurologist for her multiple sclerosis.  She has history of low back pain and burning and tingling in the hips and lateral thighs as well as in the neck.  She feels sore in her body, particularly when standing up from a chair.  She reports numbness, pain and weakness in her hands and was diagnosed with carpal tunnel syndrome.  She also reports fatigue.  She was diagnosed with multiple sclerosis in 2016 via MRI.  She did not have a lumbar puncture.  Prior labs include negative ANA, negative CRP, vit B12 256.  She was previously treated with B12 shots.  NCV-EMGs performed in May 2016 and February 2022 were normal.  She reports some trouble swallowing.  She is having a workup for that.  She reports some blurred vision.  She needed change in her glasses prescription but overall exam looked okay.  She reports some  difficulty hearing, particularly in the right ear.  She saw ent who noted a hole in her eardrum.  Her head sometimes feels heavy.  She has short-term memory deficits and word-finding problems.  She has family history of dementia (aunt and cousin)  Current DMT:  dimethyl fumarate 240mg  VID Past DMT:  a shot  Current medications: gabapentin 600mg  BID, methocarbamol 500mg  BID; escitalopram 20mg  daily, levothyroxine 75 mcg, meclizine, Toviaz 8mg , HCTZ, calcium-D 500-5 mg-mcg daily   Imaging: 05/04/2022 MRI BRAIN W WO:  Unchanged nonspecific chronic cerebral white matter disease. No evidence of active demyelination or other acute intracranial abnormality. 05/04/2022 MRI C-SPINE W WO:  1. Normal appearance of the cervical spinal cord. 2. Unchanged cervical disc degeneration, greatest at C4-5 where there is mild spinal stenosis and moderate to severe neural foraminal stenosis. 12/11/2019 MRI BRAIN WO:  No acute infarction, hemorrhage, or mass. Stable mild burden of T2 hyperintense foci in the cerebral white matter, which could reflect chronic demyelination given provided history or other etiologies of gliosis/demyelination. 12/11/2019 MRI C-SPINE WO:  No abnormal cord signal. Degenerative changes are similar to 10/16/2018. 10/16/2018 MRI C-SPINE WO:  1. Disc degeneration greatest at C4-5 where there is mild spinal stenosis and severe bilateral neural foraminal stenosis.  2. No evidence of demyelinating disease in the cervical spinal cord. 06/03/2018 MRI BRAIN WO:  No acute or reversible finding. No apparent change since August of 2017. Scattered T2 and FLAIR signal lesions within the cerebral hemispheric white matter most consistent with chronic small vessel disease. The differential diagnosis does also include the possibility of chronic demyelinating disease.  03/14/2016 MRI BRAIN W WO:  Stable T2 FLAIR hyperintense lesions concentrated in frontoparietal pericallosal white matter with a small lesion in  the genu of corpus callosum are consistent with history of demyelination and multiple sclerosis in the appropriate clinical setting. No enhancement or diffusion restriction to suggest an active process. No new lesion is identified 12/21/2014 MRI BRAIN W WO:  Moderately extensive periventricular and subcortical white matter of the signal abnormality, sparing the brainstem and cerebellum, concerning for demyelinating disease.  No diffusion signal abnormality or postcontrast enhancement to suggest an acute process  PAST MEDICAL HISTORY: Past Medical History:  Diagnosis Date   Anemia    Anxiety    Constipation 04/30/2013   Depression    Diabetes mellitus without complication (HCC)    borderline   Fibroids, intramural 01/21/2016   GERD (gastroesophageal reflux disease)    Heartburn    Hot flashes 01/11/2016   Hypothyroidism    Mixed incontinence    MS (multiple sclerosis) (HCC)    Pancreatitis    PONV (postoperative nausea and vomiting)    PTSD (post-traumatic stress disorder)    Thyroid disease    Urge urinary incontinence 05/10/2015   Vaginal bleeding 01/11/2016    PAST SURGICAL HISTORY: Past Surgical History:  Procedure Laterality Date   BIOPSY  12/07/2015   Procedure: BIOPSY;  Surgeon: West Bali, MD;  [facility];  Service: Endoscopy;;  right and left colon biopsies, gastric bx's    COLONOSCOPY WITH PROPOFOL N/A 12/07/2015   Dr. Darrick Penna: mild proctitis, hyperplastic polyp    ENDOMETRIAL ABLATION     ESOPHAGOGASTRODUODENOSCOPY (EGD) WITH PROPOFOL N/A 12/07/2015   Dr. 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    INCONTINENCE SURGERY     KNEE ARTHROSCOPY WITH MEDIAL MENISECTOMY Left 11/17/2021   Procedure: KNEE ARTHROSCOPY WITH MEDIAL MENISECTOMY;  Surgeon: Oliver Barre, MD;  Location: [facility];  Service: Orthopedics;  Laterality: Left;   SAVORY DILATION N/A 12/07/2015   Procedure: SAVORY DILATION;  Surgeon: West Bali, MD;  [facility]: [facility];  Service: Endoscopy;  Laterality: N/A;   TUBAL LIGATION      MEDICATIONS: Current Outpatient Medications on File Prior to Visit  Medication Sig Dispense Refill   cetirizine (ZYRTEC) 10 MG tablet Take 10 mg by mouth daily.     Dimethyl Fumarate 240 MG CPDR Take 240 mg by mouth 2 (two) times daily.     escitalopram (LEXAPRO) 20 MG tablet Take 20 mg by mouth daily.     gabapentin (NEURONTIN) 600 MG tablet Take 600 mg by mouth 2 (two) times daily.     hydrochlorothiazide (HYDRODIURIL) 12.5 MG tablet Take 12.5 mg by mouth daily.     levothyroxine (SYNTHROID) 75 MCG tablet Take 1 tablet (75 mcg total) by mouth daily before breakfast. 90 tablet 3   LINZESS 290 MCG CAPS capsule TAKE 1 CAPSULE BY MOUTH DAILY BEFORE BREAKFAST. 90 capsule 3   meclizine (ANTIVERT) 25 MG tablet Take 25 mg by mouth 3 (three) times daily as needed for dizziness. (Patient not taking: Reported on 05/26/2022)     methocarbamol (ROBAXIN) 500 MG tablet Take 500 mg by mouth 2 (two) times daily.     omeprazole (PRILOSEC) 20 MG capsule Take 40 mg by mouth daily.     psyllium (METAMUCIL) 58.6 % packet Take 1 packet by mouth daily as needed (constipation).     TOVIAZ 8 MG TB24 tablet Take 8 mg by mouth daily.  No current facility-administered medications on file prior to visit.    ALLERGIES: Allergies  Allergen Reactions   Ibuprofen Other (See Comments)   Nsaids Other (See Comments)    Pt states MD told her not to take due to stomach irritation   Tuberculin Tests Dermatitis    FAMILY HISTORY: Family History  Problem Relation Age of Onset   Diabetes Father    Heart disease Father    Hypertension Paternal Aunt    Heart disease Paternal Aunt    Seizures Paternal Aunt    Breast cancer Paternal Aunt    Heart disease Paternal Grandmother    Depression Daughter    Hypertension Sister    Heart disease Sister    Depression Son    ADD / ADHD Son    Depression Daughter    Colon  cancer Neg Hx    Colon polyps Neg Hx     Objective:  Blood pressure 126/75, pulse 73, height 5\' 4"  (1.626 m), weight 212 lb 14.4 oz (96.6 kg), SpO2 92 %. General: No acute distress.  Patient appears well-groomed.   Head:  Normocephalic/atraumatic Eyes:  fundi examined but not visualized Neck: supple, no paraspinal tenderness, full range of motion Back: No paraspinal tenderness Heart: regular rate and rhythm Lungs: Clear to auscultation bilaterally. Vascular: No carotid bruits. Neurological Exam: Mental status: alert and oriented to person, place, and time, speech fluent and not dysarthric, language intact. Cranial nerves: CN I: not tested CN II: pupils equal, round and reactive to light, visual fields intact CN III, IV, VI:  full range of motion, no nystagmus, no ptosis CN V: facial sensation intact. CN VII: upper and lower face symmetric CN VIII: hearing intact CN IX, X: gag intact, uvula midline CN XI: sternocleidomastoid and trapezius muscles intact CN XII: tongue midline Bulk & Tone: normal, no fasciculations. Motor:  muscle strength 5/5 throughout Sensation:  Pinprick, temperature and vibratory sensation intact. Deep Tendon Reflexes:  2+ throughout,  toes downgoing.   Finger to nose testing:  Without dysmetria.   Heel to shin:  Without dysmetria.   Gait:  Normal station and stride.  Romberg negative.    Thank you for allowing me to take part in the care of this patient.  Shon Millet, DO  CC: Tesfaye Theora Master, MD

## 2022-09-25 ENCOUNTER — Other Ambulatory Visit (INDEPENDENT_AMBULATORY_CARE_PROVIDER_SITE_OTHER): Payer: Medicaid Other

## 2022-09-25 ENCOUNTER — Encounter: Payer: Self-pay | Admitting: Neurology

## 2022-09-25 ENCOUNTER — Ambulatory Visit: Payer: Medicaid Other | Admitting: Neurology

## 2022-09-25 VITALS — BP 126/75 | HR 73 | Ht 64.0 in | Wt 212.9 lb

## 2022-09-25 DIAGNOSIS — G35 Multiple sclerosis: Secondary | ICD-10-CM

## 2022-09-25 LAB — COMPREHENSIVE METABOLIC PANEL
ALT: 10 U/L (ref 0–35)
AST: 15 U/L (ref 0–37)
Albumin: 4.2 g/dL (ref 3.5–5.2)
Alkaline Phosphatase: 69 U/L (ref 39–117)
BUN: 17 mg/dL (ref 6–23)
CO2: 32 mEq/L (ref 19–32)
Calcium: 10 mg/dL (ref 8.4–10.5)
Chloride: 102 mEq/L (ref 96–112)
Creatinine, Ser: 0.81 mg/dL (ref 0.40–1.20)
GFR: 81.82 mL/min (ref >60.00)
Glucose, Bld: 92 mg/dL (ref 70–99)
Potassium: 4.1 mEq/L (ref 3.5–5.1)
Sodium: 142 mEq/L (ref 135–145)
Total Bilirubin: 0.3 mg/dL (ref 0.2–1.2)
Total Protein: 7.7 g/dL (ref 6.0–8.3)

## 2022-09-25 LAB — VITAMIN B12: Vitamin B-12: 108 pg/mL — ABNORMAL LOW (ref 211–911)

## 2022-09-25 NOTE — Patient Instructions (Signed)
Check blood work:  CBC with diff, CMP, B12 Schedule for spinal tap:  Check cell count, protein, glucose, gram stain and culture, cytology, IgG index, oligoclonal bands Follow up in 6 months or sooner if needed.

## 2022-09-28 ENCOUNTER — Encounter: Payer: Self-pay | Admitting: Radiology

## 2022-09-29 ENCOUNTER — Ambulatory Visit: Payer: Medicaid Other | Admitting: Orthopedic Surgery

## 2022-10-03 ENCOUNTER — Ambulatory Visit: Payer: Medicaid Other | Admitting: Orthopedic Surgery

## 2022-10-03 ENCOUNTER — Encounter: Payer: Self-pay | Admitting: Orthopedic Surgery

## 2022-10-03 VITALS — BP 133/83 | HR 72 | Ht 64.0 in | Wt 212.0 lb

## 2022-10-03 DIAGNOSIS — R202 Paresthesia of skin: Secondary | ICD-10-CM

## 2022-10-03 NOTE — Progress Notes (Unsigned)
Orthopaedic Clinic Return  Assessment: Dawn Barnes is a 57 y.o. female with the following:  Plan: Dawn Barnes has complaints of paresthesias in both hands.  Her description is within the median nerve distribution.  On physical exam, her median nerve is not that irritated.  I am unable to elicit response with provocative testing.  Nonetheless, she should continue to use the braces on both hands at nighttime, as this appears to be helping with some of her symptoms.  Should her symptoms worsen, advised to return.  Nothing further is needed at this time.  Follow-up as needed.  Follow-up: Return if symptoms worsen or fail to improve.   Subjective:  Chief Complaint  Patient presents with   Hand Pain    Pt w/bilat numbness and weakness for years, getting worse.     History of Present Illness: Dawn Barnes is a 57 y.o. female who returns to clinic for evaluation of bilateral hand pain, numbness and tingling.  She states that she has had numbness and tingling in both hands for several years.  It that is progressively getting worse.  No history of injury.  She has been wearing a brace on both hands, and this has improved some of her symptoms.  Review of Systems: No fevers or chills + numbness or tingling No chest pain No shortness of breath No bowel or bladder dysfunction No GI distress No headaches   Objective: BP 133/83   Pulse 72   Ht '5\' 4"'$  (1.626 m)   Wt 212 lb (96.2 kg)   BMI 36.39 kg/m   Physical Exam:  Alert and oriented.  No acute distress.  Bilateral hands without deformity.  No obvious atrophy of the thenar musculature.  Negative Tinel's bilaterally.  Negative carpal tunnel compression test bilaterally.  Negative Phalen's bilaterally.  Fingers warm and well-perfused.  Sensation intact throughout the median nerve distribution.  IMAGING: I personally ordered and reviewed the following images:   Dawn Rasmussen, MD 10/03/2022 3:35 PM

## 2022-10-04 ENCOUNTER — Encounter: Payer: Self-pay | Admitting: Orthopedic Surgery

## 2022-10-10 ENCOUNTER — Other Ambulatory Visit (HOSPITAL_COMMUNITY)
Admission: RE | Admit: 2022-10-10 | Discharge: 2022-10-10 | Disposition: A | Discharge status: Home / Self Care | Payer: Medicaid Other | Source: Ambulatory Visit | Attending: Neurology | Admitting: Neurology

## 2022-10-10 ENCOUNTER — Ambulatory Visit
Admission: RE | Admit: 2022-10-10 | Discharge: 2022-10-10 | Disposition: A | Discharge status: Home / Self Care | Payer: Medicaid Other | Source: Ambulatory Visit | Attending: Neurology | Admitting: Neurology

## 2022-10-10 VITALS — BP 123/67 | HR 61

## 2022-10-10 DIAGNOSIS — G35 Multiple sclerosis: Secondary | ICD-10-CM | POA: Insufficient documentation

## 2022-10-10 NOTE — Progress Notes (Signed)
Blood drawn from pts RAC to be sent off with LP lab work. 1 successful attempt. Pt tolerated well. Gauze and tape applied.  

## 2022-10-10 NOTE — Discharge Instructions (Signed)

## 2022-10-11 ENCOUNTER — Ambulatory Visit
Admission: RE | Admit: 2022-10-11 | Discharge: 2022-10-11 | Disposition: A | Discharge status: Home / Self Care | Payer: Medicaid Other | Source: Ambulatory Visit | Attending: Otolaryngology | Admitting: Otolaryngology

## 2022-10-11 DIAGNOSIS — J358 Other chronic diseases of tonsils and adenoids: Secondary | ICD-10-CM

## 2022-10-11 LAB — CYTOLOGY - NON PAP

## 2022-10-11 MED ORDER — IOPAMIDOL (ISOVUE-300) INJECTION 61%
75.0000 mL | Freq: Once | INTRAVENOUS | Status: AC | PRN
  Administered 2022-10-11: 75 mL via INTRAVENOUS

## 2022-10-17 LAB — OLIGOCLONAL BANDS, CSF + SERM: Oligo Bands: ABSENT

## 2022-10-17 LAB — CSF CELL COUNT WITH DIFFERENTIAL
RBC Count, CSF: 39 cells/uL — ABNORMAL HIGH
TOTAL NUCLEATED CELL: 1 cells/uL (ref 0–5)

## 2022-10-17 LAB — CSF CULTURE W GRAM STAIN
MICRO NUMBER:: 14680993
Result:: NO GROWTH
SPECIMEN QUALITY:: ADEQUATE

## 2022-10-17 LAB — CNS IGG SYNTHESIS RATE, CSF+BLOOD
Albumin Serum: 4.1 g/dL (ref 3.6–5.1)
Albumin, CSF: 27.1 mg/dL (ref 8.0–42.0)
CNS-IgG Synthesis Rate: -0.4 mg/24 h (ref <=3.3)
IgG (Immunoglobin G), Serum: 1000 mg/dL (ref 600–1640)
IgG Total CSF: 3.6 mg/dL (ref 0.8–7.7)
IgG-Index: 0.54 (ref <0.70)

## 2022-10-17 LAB — PROTEIN, CSF: Total Protein, CSF: 54 mg/dL — ABNORMAL HIGH (ref 15–45)

## 2022-10-17 LAB — GLUCOSE, CSF: Glucose, CSF: 65 mg/dL (ref 40–80)

## 2022-10-17 NOTE — Progress Notes (Signed)
NEUROLOGY FOLLOW UP OFFICE NOTE  [name] [id number]  Assessment/Plan:   Reported history of multiple sclerosis:  I do not think that Ms. Hosbach has multiple sclerosis.  Findings on brain MRI are nonspecific and may be accounted for by chronic small vessel ischemic changes. There is no evidence of demyelinating disease involving her spinal cord and CSF does not exhibit abnormalities suggestive of CNS pathology.  I think her symptoms of pain and paresthesias is due to B12 deficiency and underlying chronic pain syndrome.   As I do not believe that she has MS, I will discontinue dimethyl fumarate.  Continue B12 supplementation under management of PCP. Follow up as needed.     Subjective:  Dawn Barnes is a 57 year old female with hypothyroidism, PTSD, depression/anxiety and chronic pain who follows up regarding her diagnosis of multiple sclerosis.    UPDATE: Current DMT:  dimethyl fumarate 240mg  twice daily  Serum labs from 2/26/204 revealed low B12 level of 108.  Advised to follow up with PCP to undergo supplementation.  Underwent lumbar puncture on 10/10/2022.  CSF analysis revealed total nuncleated cell of 1, protein 54, glucose 65, negative for oligoclonal bands, IgG index 0.54, IgG synthesis rate -0.4, negative cytology, negative culture   HISTORY: Patient's previous neurologist has retired and thus, needs to establish care with another neurologist for her multiple sclerosis.   She has history of low back pain and burning and tingling in the hips and lateral thighs as well as in the neck.  She feels sore in her body, particularly when standing up from a chair.  She reports numbness, pain and weakness in her hands and was diagnosed with carpal tunnel syndrome.  She also reports fatigue.  She was diagnosed with multiple sclerosis in 2016 via MRI.  She did not have a lumbar puncture.  Prior labs include negative ANA, negative CRP, vit B12 256.  She was previously treated with B12 shots.   NCV-EMGs performed in May 2016 and February 2022 were normal.  She reports some trouble swallowing.  She is having a workup for that.  She reports some blurred vision.  She needed change in her glasses prescription but overall exam looked okay.  She reports some difficulty hearing, particularly in the right ear.  She saw ent who noted a hole in her eardrum.  Her head sometimes feels heavy.  She has short-term memory deficits and word-finding problems.  She has family history of dementia (aunt and cousin)   Current DMT:  dimethyl fumarate 240mg  VID Past DMT:  a shot   Current medications: gabapentin 600mg  BID, methocarbamol 500mg  BID; escitalopram 20mg  daily, levothyroxine 75 mcg, meclizine, Toviaz 8mg , HCTZ, calcium-D 500-5 mg-mcg daily    Imaging: 05/04/2022 MRI BRAIN W WO:  Unchanged nonspecific chronic cerebral white matter disease. No evidence of active demyelination or other acute intracranial abnormality. 05/04/2022 MRI C-SPINE W WO:  1. Normal appearance of the cervical spinal cord. 2. Unchanged cervical disc degeneration, greatest at C4-5 where there is mild spinal stenosis and moderate to severe neural foraminal stenosis. 12/11/2019 MRI BRAIN WO:  No acute infarction, hemorrhage, or mass. Stable mild burden of T2 hyperintense foci in the cerebral white matter, which could reflect chronic demyelination given provided history or other etiologies of gliosis/demyelination. 12/11/2019 MRI C-SPINE WO:  No abnormal cord signal. Degenerative changes are similar to 10/16/2018. 10/16/2018 MRI C-SPINE WO:  1. Disc degeneration greatest at C4-5 where there is mild spinal stenosis and severe bilateral neural foraminal  stenosis.  2. No evidence of demyelinating disease in the cervical spinal cord. 06/03/2018 MRI BRAIN WO:  No acute or reversible finding. No apparent change since August of 2017. Scattered T2 and FLAIR signal lesions within the cerebral hemispheric white matter most consistent with chronic  small vessel disease. The differential diagnosis does also include the possibility of chronic demyelinating disease. 03/14/2016 MRI BRAIN W WO:  Stable T2 FLAIR hyperintense lesions concentrated in frontoparietal pericallosal white matter with a small lesion in the genu of corpus callosum are consistent with history of demyelination and multiple sclerosis in the appropriate clinical setting. No enhancement or diffusion restriction to suggest an active process. No new lesion is identified 12/21/2014 MRI BRAIN W WO:  Moderately extensive periventricular and subcortical white matter of the signal abnormality, sparing the brainstem and cerebellum, concerning for demyelinating disease.  No diffusion signal abnormality or postcontrast enhancement to suggest an acute process  PAST MEDICAL HISTORY: Past Medical History:  Diagnosis Date   Anemia    Anxiety    Constipation 04/30/2013   Depression    Diabetes mellitus without complication ([place])    borderline   Fibroids, intramural 01/21/2016   GERD (gastroesophageal reflux disease)    Heartburn    Hot flashes 01/11/2016   Hypothyroidism    Mixed incontinence    MS (multiple sclerosis) (HCC)    Pancreatitis    PONV (postoperative nausea and vomiting)    PTSD (post-traumatic stress disorder)    Thyroid disease    Urge urinary incontinence 05/10/2015   Vaginal bleeding 01/11/2016    MEDICATIONS: Current Outpatient Medications on File Prior to Visit  Medication Sig Dispense Refill   calcium-vitamin D (OSCAL WITH D) 500-5 MG-MCG tablet Take 1 tablet by mouth.     cetirizine (ZYRTEC) 10 MG tablet Take 10 mg by mouth daily.     Dimethyl Fumarate 240 MG CPDR Take 240 mg by mouth 2 (two) times daily.     escitalopram (LEXAPRO) 20 MG tablet Take 20 mg by mouth daily.     gabapentin (NEURONTIN) 600 MG tablet Take 600 mg by mouth 2 (two) times daily.     hydrochlorothiazide (HYDRODIURIL) 12.5 MG tablet Take 12.5 mg by mouth daily.     levothyroxine  (SYNTHROID) 75 MCG tablet Take 1 tablet (75 mcg total) by mouth daily before breakfast. 90 tablet 3   LINZESS 290 MCG CAPS capsule TAKE 1 CAPSULE BY MOUTH DAILY BEFORE BREAKFAST. 90 capsule 3   meclizine (ANTIVERT) 25 MG tablet Take 25 mg by mouth 3 (three) times daily as needed for dizziness.     methocarbamol (ROBAXIN) 500 MG tablet Take 500 mg by mouth 2 (two) times daily.     omeprazole (PRILOSEC) 20 MG capsule Take 40 mg by mouth daily.     psyllium (METAMUCIL) 58.6 % packet Take 1 packet by mouth daily as needed (constipation).     TOVIAZ 8 MG TB24 tablet Take 8 mg by mouth daily.     No current facility-administered medications on file prior to visit.    ALLERGIES: Allergies  Allergen Reactions   Ibuprofen Other (See Comments)   Nsaids Other (See Comments)    Pt states MD told her not to take due to stomach irritation   Tuberculin Tests Dermatitis    FAMILY HISTORY: Family History  Problem Relation Age of Onset   Diabetes Father    Heart disease Father    Hypertension Sister    Heart disease Sister    Stroke Paternal Aunt  Dementia Paternal Aunt    Hypertension Paternal Aunt    Heart disease Paternal Aunt    Seizures Paternal 79    Breast cancer Paternal 2    Stroke Paternal Grandmother    Heart disease Paternal Grandmother    Depression Daughter    Depression Daughter    Depression Son    ADD / ADHD Son    Colon cancer Neg Hx    Colon polyps Neg Hx       Objective:  Blood pressure 118/75, pulse 70, height 5\' 4"  (1.626 m), weight 212 lb 6.4 oz (96.3 kg), SpO2 96 %. General: No acute distress.  Patient appears well-groomed.     Metta Clines, DO  CC: Tesfaye Janell Quiet, MD

## 2022-10-20 ENCOUNTER — Encounter: Payer: Self-pay | Admitting: Neurology

## 2022-10-20 ENCOUNTER — Ambulatory Visit: Payer: Medicaid Other | Admitting: Neurology

## 2022-10-20 VITALS — BP 118/75 | HR 70 | Ht 64.0 in | Wt 212.4 lb

## 2022-10-20 DIAGNOSIS — E538 Deficiency of other specified B group vitamins: Secondary | ICD-10-CM

## 2022-10-20 DIAGNOSIS — G894 Chronic pain syndrome: Secondary | ICD-10-CM | POA: Diagnosis not present

## 2022-10-20 NOTE — Patient Instructions (Signed)
I don't think you have multiple sclerosis.  Therefore, I would stop Tecfidera.  I think your symptoms are related to a combination of a chronic pain syndrome and vitamin B12 deficiency.  Continue B12 supplements and follow up with your PCP.  Follow up with me as needed.

## 2022-11-01 ENCOUNTER — Other Ambulatory Visit: Payer: Self-pay | Admitting: Gastroenterology

## 2022-11-02 ENCOUNTER — Ambulatory Visit (INDEPENDENT_AMBULATORY_CARE_PROVIDER_SITE_OTHER): Payer: Medicaid Other | Admitting: Adult Health

## 2022-11-02 ENCOUNTER — Encounter: Payer: Self-pay | Admitting: Adult Health

## 2022-11-02 ENCOUNTER — Other Ambulatory Visit (HOSPITAL_COMMUNITY)
Admission: RE | Admit: 2022-11-02 | Discharge: 2022-11-02 | Disposition: A | Discharge status: Home / Self Care | Payer: Medicaid Other | Source: Ambulatory Visit | Attending: Adult Health | Admitting: Adult Health

## 2022-11-02 VITALS — BP 142/93 | HR 67 | Ht 64.0 in | Wt 211.5 lb

## 2022-11-02 DIAGNOSIS — Z0000 Encounter for general adult medical examination without abnormal findings: Secondary | ICD-10-CM | POA: Insufficient documentation

## 2022-11-02 DIAGNOSIS — Z1211 Encounter for screening for malignant neoplasm of colon: Secondary | ICD-10-CM

## 2022-11-02 DIAGNOSIS — Z01419 Encounter for gynecological examination (general) (routine) without abnormal findings: Secondary | ICD-10-CM

## 2022-11-02 LAB — HEMOCCULT GUIAC POC 1CARD (OFFICE): Fecal Occult Blood, POC: NEGATIVE

## 2022-11-02 NOTE — Progress Notes (Signed)
Patient ID: Dawn Barnes, female   DOB: 1966/03/14, 57 y.o.   MRN: [id number] History of Present Illness: Dawn Barnes is a 57 year old white female, divorced, PM in for a well woman gyn exam and pap.  PCP is Dr Legrand Rams   Current Medications, Allergies, Past Medical History, Past Surgical History, Family History and Social History were reviewed in [facility] record.     Review of Systems: Patient denies any headaches, hearing loss, fatigue, blurred vision, shortness of breath, chest pain, abdominal pain, problems with bowel movements(on linzess), urination, or intercourse.(Not active) No joint pain or mood swings.     Physical Exam:BP (!) 142/93 (BP Location: Right Arm, Patient Position: Sitting, Cuff Size: Normal)   Pulse 67   Ht 5\' 4"  (1.626 m)   Wt 211 lb 8 oz (95.9 kg)   BMI 36.30 kg/m   General:  Well developed, well nourished, no acute distress Skin:  Warm and dry Neck:  Midline trachea, normal thyroid, good ROM, no lymphadenopathy Lungs; Clear to auscultation bilaterally Breast:  No dominant palpable mass, retraction, or nipple discharge Cardiovascular: Regular rate and rhythm Abdomen:  Soft, non tender, no hepatosplenomegaly Pelvic:  External genitalia is normal in appearance, no lesions.  The vagina is pale. Urethra has no lesions or masses. The cervix is smooth,pap with HR HPV genotyping performed.  Uterus is felt to be normal size, shape, and contour.  No adnexal masses or tenderness noted.Bladder is non tender, no masses felt. Rectal: Good sphincter tone, no polyps, or hemorrhoids felt.  Hemoccult negative. Extremities/musculoskeletal:  No swelling or varicosities noted, no clubbing or cyanosis Psych:  No mood changes, alert and cooperative,seems happy AA is 0 Fall risk is moderate    11/02/2022    2:56 PM 10/13/2019   10:55 AM 03/13/2018    2:18 PM  Depression screen PHQ 2/9  Decreased Interest 1 0 0  Down, Depressed, Hopeless 1 0 0  PHQ - 2 Score 2 0 0   Altered sleeping 1    Tired, decreased energy 1    Change in appetite 0    Feeling bad or failure about yourself  0    Trouble concentrating 0    Moving slowly or fidgety/restless 0    Suicidal thoughts 0    PHQ-9 Score 4         11/02/2022    2:56 PM  GAD 7 : Generalized Anxiety Score  Nervous, Anxious, on Edge 1  Control/stop worrying 0  Worry too much - different things 1  Trouble relaxing 0  Restless 0  Easily annoyed or irritable 0  Afraid - awful might happen 0  Total GAD 7 Score 2    Upstream - 11/02/22 1455       Pregnancy Intention Screening   Does the patient want to become pregnant in the next year? No    Does the patient's partner want to become pregnant in the next year? No    Would the patient like to discuss contraceptive options today? No      Contraception Wrap Up   Current Method Female Sterilization    End Method Female Sterilization    Contraception Counseling Provided No              Examination chaperoned by Levy Pupa LPN   Impression and Plan: 1. Routine general medical examination at a health care facility Pap sent Pap in 3 years  Physical with PCP in 1 year Labs with PCP Mammogram  was negative 08/07/22 - Cytology - PAP( [facility])  2. Encounter for routine gynecological examination with Papanicolaou smear of cervix Pap sent  3. Encounter for screening fecal occult blood testing Hemoccult was negative  - POCT occult blood stool

## 2022-11-06 LAB — CYTOLOGY - PAP
Comment: NEGATIVE
Diagnosis: NEGATIVE
High risk HPV: NEGATIVE

## 2022-11-09 ENCOUNTER — Encounter: Payer: Self-pay | Admitting: Gastroenterology

## 2022-12-13 ENCOUNTER — Ambulatory Visit (INDEPENDENT_AMBULATORY_CARE_PROVIDER_SITE_OTHER): Payer: Medicaid Other | Admitting: Internal Medicine

## 2022-12-13 ENCOUNTER — Encounter: Payer: Self-pay | Admitting: Internal Medicine

## 2022-12-13 VITALS — BP 136/80 | HR 82 | Temp 98.4°F | Ht 64.0 in | Wt 210.8 lb

## 2022-12-13 DIAGNOSIS — K581 Irritable bowel syndrome with constipation: Secondary | ICD-10-CM | POA: Diagnosis not present

## 2022-12-13 DIAGNOSIS — K219 Gastro-esophageal reflux disease without esophagitis: Secondary | ICD-10-CM | POA: Diagnosis not present

## 2022-12-13 NOTE — Progress Notes (Signed)
Referring Provider: Benetta Spar* Primary Care Physician:  Benetta Spar, MD Primary GI:  Dr. Marletta Lor  Chief Complaint  Patient presents with   Follow-up    Patient here today for a follow up. Patient's Genella Barnes is under control on Omeprazole 40 mg once per day. She is still having issues with constipation and takes linzess 290 mcg daily.     HPI:   Dawn Barnes is a 57 y.o. female who presents for follow-up with a history of dysphagia likely secondary to web and/or poor dentition, chronic constipation and GERD. Colonoscopy with mild proctitis in setting of NSAIDs 2017. Last EGD in May 2017.   GERD: omeprazole 40 mg daily. Doing well on this daily.    Constipation: Linzess 290 mcg daily. Occasionally takes Metamucil. States her constipation is not well controlled. Will sometimes go a week without a BM. Notes associated abdominal pain when this happens.     Past Medical History:  Diagnosis Date   Anemia    Anxiety    Constipation 04/30/2013   Depression    Diabetes mellitus without complication (HCC)    borderline   Fibroids, intramural 01/21/2016   GERD (gastroesophageal reflux disease)    Heartburn    Hot flashes 01/11/2016   Hypothyroidism    Mixed incontinence    MS (multiple sclerosis) (HCC)    Pancreatitis    PONV (postoperative nausea and vomiting)    PTSD (post-traumatic stress disorder)    Thyroid disease    Trauma    Urge urinary incontinence 05/10/2015   Vaginal bleeding 01/11/2016    Past Surgical History:  Procedure Laterality Date   BIOPSY  12/07/2015   Procedure: BIOPSY;  Surgeon: West Bali, MD;  Location: [facility];  Service: Endoscopy;;  right and left colon biopsies, gastric bx's    COLONOSCOPY WITH PROPOFOL N/A 12/07/2015   Dr. Darrick Penna: mild proctitis, hyperplastic polyp    ENDOMETRIAL ABLATION     ESOPHAGOGASTRODUODENOSCOPY (EGD) WITH PROPOFOL N/A 12/07/2015   Dr. Darrick Penna: possible web in proximal esophagus s/p dilation,  erosive gastritis (negative H.pylori).    INCONTINENCE SURGERY     KNEE ARTHROSCOPY WITH MEDIAL MENISECTOMY Left 11/17/2021   Procedure: KNEE ARTHROSCOPY WITH MEDIAL MENISECTOMY;  Surgeon: Oliver Barre, MD;  Location: [facility];  Service: Orthopedics;  Laterality: Left;   SAVORY DILATION N/A 12/07/2015   Procedure: SAVORY DILATION;  Surgeon: West Bali, MD;  Location: [facility];  Service: Endoscopy;  Laterality: N/A;   TUBAL LIGATION      Current Outpatient Medications  Medication Sig Dispense Refill   Acetaminophen (TYLENOL PO) Take by mouth.     cetirizine (ZYRTEC) 10 MG tablet Take 10 mg by mouth daily.     Cholecalciferol (VITAMIN D-3 PO) Take 5,000 Units by mouth daily.     cyanocobalamin (VITAMIN B12) 1000 MCG tablet Take 1,000 mcg by mouth daily.     escitalopram (LEXAPRO) 20 MG tablet Take 20 mg by mouth daily.     hydrochlorothiazide (HYDRODIURIL) 12.5 MG tablet Take 12.5 mg by mouth daily.     levothyroxine (SYNTHROID) 75 MCG tablet Take 1 tablet (75 mcg total) by mouth daily before breakfast. 90 tablet 3   LINZESS 290 MCG CAPS capsule TAKE 1 CAPSULE BY MOUTH DAILY BEFORE BREAKFAST. 90 capsule 0   omeprazole (PRILOSEC) 20 MG capsule Take 40 mg by mouth daily.     psyllium (METAMUCIL) 58.6 % packet Take 1 packet by mouth daily as needed (constipation).  TOVIAZ 8 MG TB24 tablet Take 8 mg by mouth daily.     No current facility-administered medications for this visit.    Allergies as of 12/13/2022 - Review Complete 12/13/2022  Allergen Reaction Noted   Ibuprofen Other (See Comments) 05/30/2021   Nsaids Other (See Comments) 01/17/2016   Tuberculin tests Dermatitis 01/18/2015    Family History  Problem Relation Age of Onset   Diabetes Father    Heart disease Father    Hypertension Sister    Heart disease Sister    Stroke Paternal Aunt    Dementia Paternal Aunt    Hypertension Paternal Aunt    Heart disease Paternal Aunt    Seizures Paternal Aunt    Breast  cancer Paternal Aunt    Stroke Paternal Grandmother    Heart disease Paternal Grandmother    Depression Daughter    Depression Daughter    Depression Son    ADD / ADHD Son    Colon cancer Neg Hx    Colon polyps Neg Hx     Social History   Socioeconomic History   Marital status: Divorced    Spouse name: Not on file   Number of children: Not on file   Years of education: Not on file   Highest education level: Not on file  Occupational History   Occupation: disability  Tobacco Use   Smoking status: Never   Smokeless tobacco: Never  Vaping Use   Vaping Use: Never used  Substance and Sexual Activity   Alcohol use: No    Alcohol/week: 0.0 standard drinks of alcohol   Drug use: No   Sexual activity: Not Currently    Birth control/protection: Surgical    Comment: tubal and ablation  Other Topics Concern   Not on file  Social History Narrative   Are you right handed or left handed?    Are you currently employed ?    What is your current occupation?   Do you live at home alone?   Who lives with you?    What type of home do you live in: 1 story or 2 story? 1       Social Determinants of Health   Financial Resource Strain: Low Risk  (11/02/2022)   Overall Financial Resource Strain (CARDIA)    Difficulty of Paying Living Expenses: Not hard at all  Food Insecurity: No Food Insecurity (11/02/2022)   Hunger Vital Sign    Worried About Running Out of Food in the Last Year: Never true    Ran Out of Food in the Last Year: Never true  Transportation Needs: No Transportation Needs (11/02/2022)   PRAPARE - Administrator, Civil Service (Medical): No    Lack of Transportation (Non-Medical): No  Physical Activity: Inactive (11/02/2022)   Exercise Vital Sign    Days of Exercise per Week: 0 days    Minutes of Exercise per Session: 10 min  Stress: Stress Concern Present (11/02/2022)   [employer] of Occupational Health - Occupational Stress Questionnaire    Feeling of  Stress : To some extent  Social Connections: Socially Isolated (11/02/2022)   Social Connection and Isolation Panel [NHANES]    Frequency of Communication with Friends and Family: Three times a week    Frequency of Social Gatherings with Friends and Family: Once a week    Attends Religious Services: Never    Database administrator or Organizations: No    Attends Banker Meetings: Never  Marital Status: Divorced    Subjective: Review of Systems  Constitutional:  Negative for chills and fever.  HENT:  Negative for congestion and hearing loss.   Eyes:  Negative for blurred vision and double vision.  Respiratory:  Negative for cough and shortness of breath.   Cardiovascular:  Negative for chest pain and palpitations.  Gastrointestinal:  Positive for constipation and heartburn. Negative for abdominal pain, blood in stool, diarrhea, melena and vomiting.  Genitourinary:  Negative for dysuria and urgency.  Musculoskeletal:  Negative for joint pain and myalgias.  Skin:  Negative for itching and rash.  Neurological:  Negative for dizziness and headaches.  Psychiatric/Behavioral:  Negative for depression. The patient is not nervous/anxious.      Objective: BP 136/80 (BP Location: Right Arm, Patient Position: Sitting, Cuff Size: Large)   Pulse 82   Temp 98.4 F (36.9 C) (Temporal)   Ht 5\' 4"  (1.626 m)   Wt 210 lb 12.8 oz (95.6 kg)   BMI 36.18 kg/m  Physical Exam Constitutional:      Appearance: Normal appearance.  HENT:     Head: Normocephalic and atraumatic.  Eyes:     Extraocular Movements: Extraocular movements intact.     Conjunctiva/sclera: Conjunctivae normal.  Cardiovascular:     Rate and Rhythm: Normal rate and regular rhythm.  Pulmonary:     Effort: Pulmonary effort is normal.     Breath sounds: Normal breath sounds.  Abdominal:     General: Bowel sounds are normal.     Palpations: Abdomen is soft.  Musculoskeletal:        General: No swelling. Normal  range of motion.     Cervical back: Normal range of motion and neck supple.  Skin:    General: Skin is warm and dry.     Coloration: Skin is not jaundiced.  Neurological:     General: No focal deficit present.     Mental Status: She is alert and oriented to person, place, and time.  Psychiatric:        Mood and Affect: Mood normal.        Behavior: Behavior normal.      Assessment: *Irritable bowel syndrome-constipation predominant *Chronic GERD-well controlled on Omeprazole daily  Plan: Chronic GERD well controlled on Omeprazole daily.   Constipation not ideally controlled on Linzess 290 mcg daily. Would recommend adding Miralax 1-2 capfuls daily to this.   She needs to drink more water.  Recommended at least 6 glasses of water daily.  Follow-up in 2 to 3 months  12/13/2022 2:51 PM   Disclaimer: This note was dictated with voice recognition software. Similar sounding words can inadvertently be transcribed and may not be corrected upon review.

## 2022-12-13 NOTE — Patient Instructions (Addendum)
Continue on omeprazole for chronic reflux.  For your chronic constipation, continue on Linzess daily.  I want you to add on over-the-counter MiraLAX (polyethylene glycol) 1-2 capfuls daily.  Ensure that you are drinking at least 4 to 6 glasses of water daily.    Follow-up in 2 to 3 months.  It was very nice meeting you today.  Dr. Marletta Lor

## 2023-01-31 ENCOUNTER — Other Ambulatory Visit (INDEPENDENT_AMBULATORY_CARE_PROVIDER_SITE_OTHER): Payer: Self-pay | Admitting: Gastroenterology

## 2023-01-31 ENCOUNTER — Telehealth: Payer: Self-pay

## 2023-01-31 DIAGNOSIS — K5900 Constipation, unspecified: Secondary | ICD-10-CM

## 2023-01-31 DIAGNOSIS — K581 Irritable bowel syndrome with constipation: Secondary | ICD-10-CM

## 2023-01-31 MED ORDER — LINACLOTIDE 290 MCG PO CAPS
290.0000 ug | ORAL_CAPSULE | Freq: Every day | ORAL | 0 refills | Status: DC
Start: 2023-01-31 — End: 2023-09-27

## 2023-01-31 NOTE — Telephone Encounter (Signed)
Sent to pharmacy 

## 2023-01-31 NOTE — Telephone Encounter (Signed)
error 

## 2023-02-03 LAB — TSH: TSH: 1.03 u[IU]/mL (ref 0.450–4.500)

## 2023-02-03 LAB — T4, FREE: Free T4: 1.28 ng/dL (ref 0.82–1.77)

## 2023-02-12 ENCOUNTER — Ambulatory Visit (INDEPENDENT_AMBULATORY_CARE_PROVIDER_SITE_OTHER): Payer: Medicaid Other | Admitting: Nurse Practitioner

## 2023-02-12 ENCOUNTER — Encounter: Payer: Self-pay | Admitting: Nurse Practitioner

## 2023-02-12 VITALS — BP 118/79 | HR 73 | Ht 64.0 in | Wt 214.4 lb

## 2023-02-12 DIAGNOSIS — R7303 Prediabetes: Secondary | ICD-10-CM

## 2023-02-12 DIAGNOSIS — E559 Vitamin D deficiency, unspecified: Secondary | ICD-10-CM

## 2023-02-12 DIAGNOSIS — E038 Other specified hypothyroidism: Secondary | ICD-10-CM

## 2023-02-12 LAB — POCT GLYCOSYLATED HEMOGLOBIN (HGB A1C): Hemoglobin A1C: 5.5 % (ref 4.0–5.6)

## 2023-02-12 MED ORDER — LEVOTHYROXINE SODIUM 75 MCG PO TABS
75.0000 ug | ORAL_TABLET | Freq: Every day | ORAL | 3 refills | Status: DC
Start: 2023-02-12 — End: 2024-02-12

## 2023-02-12 NOTE — Progress Notes (Signed)
02/12/2023  Endocrinology follow-up note    Subjective:    Patient ID: Dawn Barnes, female    DOB: 1965/11/15, PCP Benetta Spar, MD   Past Medical History:  Diagnosis Date   Anemia    Anxiety    Constipation 04/30/2013   Depression    Diabetes mellitus without complication (HCC)    borderline   Fibroids, intramural 01/21/2016   GERD (gastroesophageal reflux disease)    Heartburn    Hot flashes 01/11/2016   Hypothyroidism    Mixed incontinence    MS (multiple sclerosis) (HCC)    Pancreatitis    PONV (postoperative nausea and vomiting)    PTSD (post-traumatic stress disorder)    Thyroid disease    Trauma    Urge urinary incontinence 05/10/2015   Vaginal bleeding 01/11/2016   Past Surgical History:  Procedure Laterality Date   BIOPSY  12/07/2015   Procedure: BIOPSY;  Surgeon: West Bali, MD;  Location: [facility];  Service: Endoscopy;;  right and left colon biopsies, gastric bx's    COLONOSCOPY WITH PROPOFOL N/A 12/07/2015   Dr. Darrick Penna: mild proctitis, hyperplastic polyp    ENDOMETRIAL ABLATION     ESOPHAGOGASTRODUODENOSCOPY (EGD) WITH PROPOFOL N/A 12/07/2015   Dr. 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    INCONTINENCE SURGERY     KNEE ARTHROSCOPY WITH MEDIAL MENISECTOMY Left 11/17/2021   Procedure: KNEE ARTHROSCOPY WITH MEDIAL MENISECTOMY;  Surgeon: Oliver Barre, MD;  Location: [facility];  Service: Orthopedics;  Laterality: Left;   SAVORY DILATION N/A 12/07/2015   Procedure: SAVORY DILATION;  Surgeon: West Bali, MD;  Location: [facility];  Service: Endoscopy;  Laterality: N/A;   TUBAL LIGATION     Social History   Socioeconomic History   Marital status: Divorced    Spouse name: Not on file   Number of children: Not on file   Years of education: Not on file   Highest education level: Not on file  Occupational History   Occupation: disability  Tobacco Use   Smoking status: Never    Smokeless tobacco: Never  Vaping Use   Vaping status: Never Used  Substance and Sexual Activity   Alcohol use: No    Alcohol/week: 0.0 standard drinks of alcohol   Drug use: No   Sexual activity: Not Currently    Birth control/protection: Surgical    Comment: tubal and ablation  Other Topics Concern   Not on file  Social History Narrative   Are you right handed or left handed?    Are you currently employed ?    What is your current occupation?   Do you live at home alone?   Who lives with you?    What type of home do you live in: 1 story or 2 story? 1       Social Determinants of Health   Financial Resource Strain: Low Risk  (11/02/2022)   Overall Financial Resource Strain (CARDIA)    Difficulty of Paying Living Expenses: Not hard at all  Food Insecurity: No Food Insecurity (11/02/2022)   Hunger Vital Sign    Worried About Running Out of Food in the Last Year: Never true    Ran Out of Food in the Last Year: Never true  Transportation Needs: No Transportation Needs (11/02/2022)   PRAPARE - Administrator, Civil Service (Medical): No    Lack of Transportation (Non-Medical): No  Physical Activity: Inactive (11/02/2022)   Exercise  Vital Sign    Days of Exercise per Week: 0 days    Minutes of Exercise per Session: 10 min  Stress: Stress Concern Present (11/02/2022)   [employer] of Occupational Health - Occupational Stress Questionnaire    Feeling of Stress : To some extent  Social Connections: Socially Isolated (11/02/2022)   Social Connection and Isolation Panel [NHANES]    Frequency of Communication with Friends and Family: Three times a week    Frequency of Social Gatherings with Friends and Family: Once a week    Attends Religious Services: Never    Database administrator or Organizations: No    Attends Banker Meetings: Never    Marital Status: Divorced   Outpatient Encounter Medications as of 02/12/2023  Medication Sig   Acetaminophen (TYLENOL  PO) Take by mouth.   cetirizine (ZYRTEC) 10 MG tablet Take 10 mg by mouth daily.   Cholecalciferol (VITAMIN D-3 PO) Take 5,000 Units by mouth daily.   cyanocobalamin (VITAMIN B12) 1000 MCG tablet Take 1,000 mcg by mouth daily.   escitalopram (LEXAPRO) 20 MG tablet Take 20 mg by mouth daily.   hydrochlorothiazide (HYDRODIURIL) 12.5 MG tablet Take 12.5 mg by mouth daily.   linaclotide (LINZESS) 290 MCG CAPS capsule Take 1 capsule (290 mcg total) by mouth daily before breakfast.   LINZESS 290 MCG CAPS capsule TAKE 1 CAPSULE BY MOUTH DAILY BEFORE BREAKFAST.   omeprazole (PRILOSEC) 20 MG capsule Take 40 mg by mouth daily.   polyethylene glycol (MIRALAX / GLYCOLAX) 17 g packet Take 17 g by mouth daily.   TOVIAZ 8 MG TB24 tablet Take 8 mg by mouth daily.   [DISCONTINUED] levothyroxine (SYNTHROID) 75 MCG tablet Take 1 tablet (75 mcg total) by mouth daily before breakfast.   levothyroxine (SYNTHROID) 75 MCG tablet Take 1 tablet (75 mcg total) by mouth daily before breakfast.   psyllium (METAMUCIL) 58.6 % packet Take 1 packet by mouth daily as needed (constipation). (Patient not taking: Reported on 02/12/2023)   No facility-administered encounter medications on file as of 02/12/2023.   ALLERGIES: Allergies  Allergen Reactions   Ibuprofen Other (See Comments)   Nsaids Other (See Comments)    Pt states MD told her not to take due to stomach irritation   Tuberculin Tests Dermatitis   VACCINATION STATUS: Immunization History  Administered Date(s) Administered   Influenza-Unspecified 04/30/2014, 03/15/2015    Thyroid Problem Presents for follow-up (patient with medical history of hypothyroidism from Hashimoto's thyroiditis. ) visit. Symptoms include fatigue. Patient reports no anxiety, cold intolerance, constipation, depressed mood, diarrhea, heat intolerance, palpitations, tremors, weight gain or weight loss. The symptoms have been stable.    She also has history of prediabetes not on medications.   She is also on ongoing supplement for vitamin D deficiency.   Review of systems  Constitutional: + steadily increasing body weight,  current Body mass index is 36.8 kg/m. , + intermittent fatigue, mild subjective hyperthermia, no subjective hypothermia Eyes: no blurry vision, no xerophthalmia ENT: no sore throat, no nodules palpated in throat, no dysphagia/odynophagia, no hoarseness Cardiovascular: no chest pain, no shortness of breath, no palpitations, no leg swelling Respiratory: no cough, no shortness of breath Gastrointestinal: no nausea/vomiting/diarrhea Musculoskeletal: no muscle/joint aches, recently had her diagnosis of MS reversed (ended up being b12 deficiency) Skin: no rashes, no hyperemia Neurological: mild tremors, + numbness/tingling to neck and arm (being treated for B12 deficiency), no dizziness Psychiatric: no depression, no anxiety  Objective:    BP 118/79 (BP  Location: Left Arm, Patient Position: Sitting, Cuff Size: Large)   Pulse 73   Ht 5\' 4"  (1.626 m)   Wt 214 lb 6.4 oz (97.3 kg)   BMI 36.80 kg/m   Wt Readings from Last 3 Encounters:  02/12/23 214 lb 6.4 oz (97.3 kg)  12/13/22 210 lb 12.8 oz (95.6 kg)  11/02/22 211 lb 8 oz (95.9 kg)     BP Readings from Last 3 Encounters:  02/12/23 118/79  12/13/22 136/80  11/02/22 (!) 142/93     Physical Exam- Limited  Constitutional:  Body mass index is 36.8 kg/m. , not in acute distress, normal state of mind Eyes:  EOMI, no exophthalmos Musculoskeletal: no gross deformities, strength intact in all four extremities, no gross restriction of joint movements Skin:  no rashes, no hyperemia Neurological: no tremor with outstretched hands   Recent Results (from the past 2160 hour(s))  T4, free     Status: None   Collection Time: 02/02/23 11:39 AM  Result Value Ref Range   Free T4 1.28 0.82 - 1.77 ng/dL  TSH     Status: None   Collection Time: 02/02/23 11:39 AM  Result Value Ref Range   TSH 1.030 0.450 - 4.500  uIU/mL  HgB A1c     Status: Abnormal   Collection Time: 02/12/23  1:44 PM  Result Value Ref Range   Hemoglobin A1C 5.5 4.0 - 5.6 %   HbA1c POC (<> result, manual entry)     HbA1c, POC (prediabetic range)     HbA1c, POC (controlled diabetic range)      Latest Reference Range & Units 06/21/21 14:44 11/21/21 14:52 04/05/22 13:01 08/07/22 13:26 02/02/23 11:39  TSH 0.450 - 4.500 uIU/mL 0.629 0.054 (L) 1.140 1.010 1.030  T4,Free(Direct) 0.82 - 1.77 ng/dL 7.82 9.56 (H) 2.13 0.86 1.28  (L): Data is abnormally low (H): Data is abnormally high   Latest Reference Range & Units 03/07/18 15:27 07/11/19 14:04 05/18/20 14:30 11/16/20 16:36 11/21/21 14:52 08/07/22 13:26  Vitamin D, 25-Hydroxy 30.0 - 100.0 ng/mL 37 50 22 (L) 57.7 84.4 28.8 (L)  (L): Data is abnormally low  Assessment & Plan:   1. Hypothyroidism due to Hashimoto's thyroiditis  -Her previsit thyroid function tests are consistent appropriate hormone replacement.  She is advised to continue Levothyroxine 75 mcg po daily before breakfast.   - We discussed about the correct intake of her thyroid hormone, on empty stomach at fasting, with water, separated by at least 30 minutes from breakfast and other medications,  and separated by more than 4 hours from calcium, iron, multivitamins, acid reflux medications (PPIs). -Patient is made aware of the fact that thyroid hormone replacement is needed for life, dose to be adjusted by periodic monitoring of thyroid function tests.  2. Pre-diabetes -Her POCT A1c today is 5.5%, unchanged from last visit.  Still will not require any pharmacological intervention at this time.   Weight loss will help.  - Nutritional counseling repeated at each appointment due to patients tendency to fall back in to old habits.  - The patient admits there is a room for improvement in their diet and drink choices. -  Suggestion is made for the patient to avoid simple carbohydrates from their diet including Cakes, Sweet  Desserts / Pastries, Ice Cream, Soda (diet and regular), Sweet Tea, Candies, Chips, Cookies, Sweet Pastries, Store Bought Juices, Alcohol in Excess of 1-2 drinks a day, Artificial Sweeteners, Coffee Creamer, and "Sugar-free" Products. This will help patient to have stable blood  glucose profile and potentially avoid unintended weight gain.   - I encouraged the patient to switch to unprocessed or minimally processed complex starch and increased protein intake (animal or plant source), fruits, and vegetables.   - Patient is advised to stick to a routine mealtimes to eat 3 meals a day and avoid unnecessary snacks (to snack only to correct hypoglycemia).  3.  Vitamin D deficiency- Her most recent vitamin D level was 28.8 on 08/07/22.  She will need to restart OTC Vitamin D3 5000 units daily.  Will recheck vitamin D prior to next visit.   - I advised patient to maintain close follow up with Benetta Spar, MD for primary care needs.     I spent  31  minutes in the care of the patient today including review of labs from CMP, Lipids, Thyroid Function, Hematology (current and previous including abstractions from other facilities); face-to-face time discussing  her blood glucose readings/logs, discussing hypoglycemia and hyperglycemia episodes and symptoms, medications doses, her options of short and long term treatment based on the latest standards of care / guidelines;  discussion about incorporating lifestyle medicine;  and documenting the encounter. Risk reduction counseling performed per USPSTF guidelines to reduce obesity and cardiovascular risk factors.     Please refer to Patient Instructions for Blood Glucose Monitoring and Insulin/Medications Dosing Guide"  in media tab for additional information. Please  also refer to " Patient Self Inventory" in the Media  tab for reviewed elements of pertinent patient history.  Alease Medina participated in the discussions, expressed understanding, and  voiced agreement with the above plans.  All questions were answered to her satisfaction. she is encouraged to contact clinic should she have any questions or concerns prior to her return visit.   Follow up plan: Return in about 1 year (around 02/12/2024) for Thyroid follow up, Previsit labs.    Ronny Bacon, [facility] [address] [place], [place] [address] Phone: [phone] Fax: [phone]  02/12/2023, 1:54 PM

## 2023-03-14 ENCOUNTER — Encounter: Payer: Self-pay | Admitting: Orthopedic Surgery

## 2023-03-14 ENCOUNTER — Ambulatory Visit: Payer: Medicaid Other | Admitting: Orthopedic Surgery

## 2023-03-14 ENCOUNTER — Other Ambulatory Visit (INDEPENDENT_AMBULATORY_CARE_PROVIDER_SITE_OTHER): Payer: Medicaid Other

## 2023-03-14 VITALS — BP 132/85 | HR 81

## 2023-03-14 DIAGNOSIS — M79672 Pain in left foot: Secondary | ICD-10-CM | POA: Diagnosis not present

## 2023-03-14 DIAGNOSIS — M722 Plantar fascial fibromatosis: Secondary | ICD-10-CM

## 2023-03-14 NOTE — Progress Notes (Signed)
Orthopaedic Clinic Return  Assessment: Dawn Barnes is a 57 y.o. female with the following: Left plantar fasciitis   Plan: Dawn Barnes has pain that is consistent with plantar fasciitis.  This has been ongoing for the past 3 to 4 weeks.  Radiographs demonstrates a small calcaneal heel spur.  She has been taking Tylenol.  She cannot take NSAIDs.  I have recommended some topical treatments, including patches, creams and sprays.  I have also provided her with some simple exercises to initiate.  She can ice her heel if it is painful.  If she continues to have issues, we can consider an injection.  She states her understanding.  She will follow-up as needed.  Follow-up: Return if symptoms worsen or fail to improve.   Subjective:  Chief Complaint  Patient presents with   Foot Pain    Left- pain in heel and achilles -when I am sitting or laying down and go to get up it hurts and when I do a lot of walking    History of Present Illness: Dawn Barnes is a 57 y.o. female who returns to clinic for evaluation of left heel pain.  She is well-known to my clinic.  She has had pain in the left heel for the past 3-4 weeks.  No specific injury.  Her first few steps in the morning are particularly painful.  After that, the pain starts to improve.  She has been taking Tylenol since the onset of her pain.  Occasionally, she will have an aching sensation in the heel when she is sitting or laying.  The pain is not consistent.  She also has occasional pains into the Achilles tendon.  Review of Systems: No fevers or chills No numbness or tingling No chest pain No shortness of breath No bowel or bladder dysfunction No GI distress No headaches   Objective: BP 132/85   Pulse 81   Physical Exam:  Alert and oriented.  No acute distress.  Mild left-sided antalgic gait.  Dry skin throughout the left lower extremity, as well as the left foot.  She is able to get beyond a plantigrade position with the knee  extended.  She has point tenderness on the plantar heel.  The plantar fascia is not particularly prominent.  Toes warm well-perfused.  Sensation intact over the dorsum of the foot.  IMAGING: I personally ordered and reviewed the following images:  X-rays of the left foot were obtained in clinic today.  These are nonweightbearing views.  No acute injuries.  No dislocation.  No bony lesions.  Small plantar calcaneus heel spur.  Impression: Left foot x-rays without acute injury   Oliver Barre, MD 03/14/2023 3:54 PM

## 2023-03-14 NOTE — Patient Instructions (Signed)
Topical treatments like bio freeze, voltaren gel or lidocaine can be helpful  Patches can be helpful  Ice if it is painful  Do the attached exercises daily.   If pain is not getting better, we can consider an injection or physical therapy

## 2023-03-15 ENCOUNTER — Ambulatory Visit (INDEPENDENT_AMBULATORY_CARE_PROVIDER_SITE_OTHER): Payer: Medicaid Other | Admitting: Gastroenterology

## 2023-03-15 ENCOUNTER — Encounter: Payer: Self-pay | Admitting: Gastroenterology

## 2023-03-15 VITALS — BP 128/84 | HR 79 | Temp 97.5°F | Ht 64.0 in | Wt 210.5 lb

## 2023-03-15 DIAGNOSIS — K219 Gastro-esophageal reflux disease without esophagitis: Secondary | ICD-10-CM | POA: Diagnosis not present

## 2023-03-15 DIAGNOSIS — K5900 Constipation, unspecified: Secondary | ICD-10-CM | POA: Diagnosis not present

## 2023-03-15 NOTE — Patient Instructions (Signed)
I recommend taking the Miralax every single day in the morning along with the Linzess.  Continue omeprazole as you are doing.  We will see you in 6 weeks to make sure this is helping you have better productive stools and less bloating!  I enjoyed seeing you again today! I value our relationship and want to provide genuine, compassionate, and quality care. You may receive a survey regarding your visit with me, and I welcome your feedback! Thanks so much for taking the time to complete this. I look forward to seeing you again.      Gelene Mink, PhD, ANP-BC [facility]

## 2023-03-15 NOTE — Progress Notes (Signed)
Gastroenterology Office Note     Primary Care Physician:  Benetta Spar, MD  Primary Gastroenterologist: Dr. Marletta Lor    Chief Complaint   Chief Complaint  Patient presents with   Follow-up    Patient here today for a follow up on her GERD and constipation. Patient says she has been trying to increase her fluids to decrease issues with constipation. Patient takes Linzess 290 mcg daily and miralax daily. Patient states the reflux issues are controlled on her omeprazole 40 mg daily.     History of Present Illness   Dawn Barnes is a 57 y.o. female presenting today with a history of GERD, chronic dysphagia, chronic constipation. Colonoscopy with mild proctitis in setting of NSAIDs 2017. Edentulous. Last EGD in May 2017.   Last seen May 2024 by Dr. Marletta Lor with instructions to continue Linzess 290 mcg daily and adding Miralax 1-2 capfuls daily as needed. Hasn't taken daily. Helps some if taking Miralax but not taking consistently. Feels full. Trying to drink more water. Wonders if stool is productive.   Omeprazole daily for GERD doing well.      Past Medical History:  Diagnosis Date   Anemia    Anxiety    Constipation 04/30/2013   Depression    Diabetes mellitus without complication (HCC)    borderline   Fibroids, intramural 01/21/2016   GERD (gastroesophageal reflux disease)    Heartburn    Hot flashes 01/11/2016   Hypothyroidism    Mixed incontinence    MS (multiple sclerosis) (HCC)    Pancreatitis    PONV (postoperative nausea and vomiting)    PTSD (post-traumatic stress disorder)    Thyroid disease    Trauma    Urge urinary incontinence 05/10/2015   Vaginal bleeding 01/11/2016    Past Surgical History:  Procedure Laterality Date   BIOPSY  12/07/2015   Procedure: BIOPSY;  Surgeon: West Bali, MD;  [facility]: [facility];  Service: Endoscopy;;  right and left colon biopsies, gastric bx's    COLONOSCOPY WITH PROPOFOL N/A 12/07/2015   Dr. Darrick Penna: mild  proctitis, hyperplastic polyp    ENDOMETRIAL ABLATION     ESOPHAGOGASTRODUODENOSCOPY (EGD) WITH PROPOFOL N/A 12/07/2015   Dr. 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    INCONTINENCE SURGERY     KNEE ARTHROSCOPY WITH MEDIAL MENISECTOMY Left 11/17/2021   Procedure: KNEE ARTHROSCOPY WITH MEDIAL MENISECTOMY;  Surgeon: Oliver Barre, MD;  Location: [facility];  Service: Orthopedics;  Laterality: Left;   SAVORY DILATION N/A 12/07/2015   Procedure: SAVORY DILATION;  Surgeon: West Bali, MD;  Location: [facility];  Service: Endoscopy;  Laterality: N/A;   TUBAL LIGATION      Current Outpatient Medications  Medication Sig Dispense Refill   Acetaminophen (TYLENOL PO) Take by mouth.     cetirizine (ZYRTEC) 10 MG tablet Take 10 mg by mouth daily.     Cholecalciferol (VITAMIN D-3 PO) Take 5,000 Units by mouth daily.     cyanocobalamin (VITAMIN B12) 1000 MCG tablet Take 1,000 mcg by mouth daily.     escitalopram (LEXAPRO) 20 MG tablet Take 20 mg by mouth daily.     hydrochlorothiazide (HYDRODIURIL) 12.5 MG tablet Take 12.5 mg by mouth daily.     levothyroxine (SYNTHROID) 75 MCG tablet Take 1 tablet (75 mcg total) by mouth daily before breakfast. 90 tablet 3   linaclotide (LINZESS) 290 MCG CAPS capsule Take 1 capsule (290 mcg total) by mouth  daily before breakfast. 30 capsule 0   omeprazole (PRILOSEC) 20 MG capsule Take 40 mg by mouth daily.     polyethylene glycol (MIRALAX / GLYCOLAX) 17 g packet Take 17 g by mouth daily.     TOVIAZ 8 MG TB24 tablet Take 8 mg by mouth daily.     psyllium (METAMUCIL) 58.6 % packet Take 1 packet by mouth daily as needed (constipation). (Patient not taking: Reported on 03/15/2023)     No current facility-administered medications for this visit.    Allergies as of 03/15/2023 - Review Complete 03/15/2023  Allergen Reaction Noted   Ibuprofen Other (See Comments) 05/30/2021   Nsaids Other (See Comments) 01/17/2016    Tuberculin tests Dermatitis 01/18/2015    Family History  Problem Relation Age of Onset   Diabetes Father    Heart disease Father    Hypertension Sister    Heart disease Sister    Stroke Paternal Aunt    Dementia Paternal Aunt    Hypertension Paternal Aunt    Heart disease Paternal Aunt    Seizures Paternal Aunt    Breast cancer Paternal Aunt    Stroke Paternal Grandmother    Heart disease Paternal Grandmother    Depression Daughter    Depression Daughter    Depression Son    ADD / ADHD Son    Colon cancer Neg Hx    Colon polyps Neg Hx     Social History   Socioeconomic History   Marital status: Divorced    Spouse name: Not on file   Number of children: Not on file   Years of education: Not on file   Highest education level: Not on file  Occupational History   Occupation: disability  Tobacco Use   Smoking status: Never   Smokeless tobacco: Never  Vaping Use   Vaping status: Never Used  Substance and Sexual Activity   Alcohol use: No    Alcohol/week: 0.0 standard drinks of alcohol   Drug use: No   Sexual activity: Not Currently    Birth control/protection: Surgical    Comment: tubal and ablation  Other Topics Concern   Not on file  Social History Narrative   Are you right handed or left handed?    Are you currently employed ?    What is your current occupation?   Do you live at home alone?   Who lives with you?    What type of home do you live in: 1 story or 2 story? 1       Social Determinants of Health   Financial Resource Strain: Low Risk  (11/02/2022)   Overall Financial Resource Strain (CARDIA)    Difficulty of Paying Living Expenses: Not hard at all  Food Insecurity: No Food Insecurity (11/02/2022)   Hunger Vital Sign    Worried About Running Out of Food in the Last Year: Never true    Ran Out of Food in the Last Year: Never true  Transportation Needs: No Transportation Needs (11/02/2022)   PRAPARE - Administrator, Civil Service  (Medical): No    Lack of Transportation (Non-Medical): No  Physical Activity: Inactive (11/02/2022)   Exercise Vital Sign    Days of Exercise per Week: 0 days    Minutes of Exercise per Session: 10 min  Stress: Stress Concern Present (11/02/2022)   [employer] of Occupational Health - Occupational Stress Questionnaire    Feeling of Stress : To some extent  Social Connections: Socially Isolated (11/02/2022)  Social Connection and Isolation Panel [NHANES]    Frequency of Communication with Friends and Family: Three times a week    Frequency of Social Gatherings with Friends and Family: Once a week    Attends Religious Services: Never    Database administrator or Organizations: No    Attends Banker Meetings: Never    Marital Status: Divorced  Catering manager Violence: Not At Risk (11/02/2022)   Humiliation, Afraid, Rape, and Kick questionnaire    Fear of Current or Ex-Partner: No    Emotionally Abused: No    Physically Abused: No    Sexually Abused: No     Review of Systems   Gen: Denies any fever, chills, fatigue, weight loss, lack of appetite.  CV: Denies chest pain, heart palpitations, peripheral edema, syncope.  Resp: Denies shortness of breath at rest or with exertion. Denies wheezing or cough.  GI: Denies dysphagia or odynophagia. Denies jaundice, hematemesis, fecal incontinence. GU : Denies urinary burning, urinary frequency, urinary hesitancy MS: Denies joint pain, muscle weakness, cramps, or limitation of movement.  Derm: Denies rash, itching, dry skin Psych: Denies depression, anxiety, memory loss, and confusion Heme: Denies bruising, bleeding, and enlarged lymph nodes.   Physical Exam   BP 128/84 (BP Location: Left Arm, Patient Position: Sitting, Cuff Size: Normal)   Pulse 79   Temp (!) 97.5 F (36.4 C) (Temporal)   Ht 5\' 4"  (1.626 m)   Wt 210 lb 8 oz (95.5 kg)   BMI 36.13 kg/m  General:   Alert and oriented. Pleasant and cooperative.  Well-nourished and well-developed.  Head:  Normocephalic and atraumatic. Eyes:  Without icterus Abdomen:  +BS, soft, non-tender and non-distended. No HSM noted. No guarding or rebound. No masses appreciated.  Rectal:  Deferred  Msk:  Symmetrical without gross deformities. Normal posture. Extremities:  Without edema. Neurologic:  Alert and  oriented x4;  grossly normal neurologically. Skin:  Intact without significant lesions or rashes. Psych:  Alert and cooperative. Normal mood and affect.   Assessment   Dawn Barnes is a 57 y.o. female presenting today with a history of GERD, chronic dysphagia, chronic constipation. Colonoscopy with mild proctitis in setting of NSAIDs 2017. Last EGD in May 2017.   Constipation: continue Linzess 290 mcg daily. We discussed being dedicated with taking the Miralax daily for a good faith effort to see how this helps with stool productivity and complete BMs. May need to change agents in the future.  GERD: controlled on omeprazole daily.     PLAN    Linzess 290 mcg daily, adding Miralax daily Continue increased water intake Continue omeprazole daily Close follow-up in 6 weeks    Gelene Mink, PhD, [facility] Gastroenterology

## 2023-03-26 ENCOUNTER — Ambulatory Visit: Payer: Medicaid Other | Admitting: Neurology

## 2023-04-24 ENCOUNTER — Ambulatory Visit: Payer: Medicaid Other | Admitting: Gastroenterology

## 2023-04-26 ENCOUNTER — Ambulatory Visit: Payer: Medicaid Other | Admitting: Gastroenterology

## 2023-05-02 ENCOUNTER — Ambulatory Visit (INDEPENDENT_AMBULATORY_CARE_PROVIDER_SITE_OTHER): Payer: Medicaid Other | Admitting: Gastroenterology

## 2023-05-02 ENCOUNTER — Encounter: Payer: Self-pay | Admitting: Gastroenterology

## 2023-05-02 VITALS — BP 120/76 | HR 69 | Temp 97.8°F | Ht 64.0 in | Wt 215.0 lb

## 2023-05-02 DIAGNOSIS — K219 Gastro-esophageal reflux disease without esophagitis: Secondary | ICD-10-CM

## 2023-05-02 DIAGNOSIS — K5900 Constipation, unspecified: Secondary | ICD-10-CM | POA: Diagnosis not present

## 2023-05-02 MED ORDER — LUBIPROSTONE 24 MCG PO CAPS: 24.0000 ug | ORAL_CAPSULE | Freq: Two times a day (BID) | ORAL | 3 refills | Status: DC

## 2023-05-02 NOTE — Patient Instructions (Signed)
Let's try Amitiza twice a day WITH FOOD to avoid nausea.   Stop miralax for now unless needed.  We will see you back in January!  Please call if no improvement!  Have a great birthday!  I enjoyed seeing you again today! I value our relationship and want to provide genuine, compassionate, and quality care. You may receive a survey regarding your visit with me, and I welcome your feedback! Thanks so much for taking the time to complete this. I look forward to seeing you again.      Gelene Mink, PhD, ANP-BC [facility] Gastroenterology

## 2023-05-02 NOTE — Progress Notes (Signed)
Gastroenterology Office Note     Primary Care Physician:  Benetta Spar, MD  Primary Gastroenterologist: Dr. Marletta Lor    Chief Complaint   Chief Complaint  Patient presents with   Follow-up    Patient here today for a follow up on her Dawn Barnes. Patient says she is still having some gerd symptoms event though she is taking omeprazole 40 mg daily.   Constipation is some better with the miralax daily and Linzess 290 mcg daily.     History of Present Illness   Dawn Barnes is a 57 y.o. female presenting today with a history of GERD, chronic dysphagia, chronic constipation. Colonoscopy with mild proctitis in setting of NSAIDs 2017. Edentulous. Last EGD in May 2017.   Has gained some weight. Eats late at times and may bend over and regurgitate. Taking omeprazole daily. No dysphagia.   Taking Linzess 290 mcg daily and Miralax daily. Feeling bloated still. Miralax pricey. Linzess not ideal.   Last few days has had heart palpitations. No significant chest pain or shortness of breath.     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    Past Medical History:  Diagnosis Date   Anemia    Anxiety    Constipation 04/30/2013   Depression    Diabetes mellitus without complication (HCC)    borderline   Fibroids, intramural 01/21/2016   GERD (gastroesophageal reflux disease)    Heartburn    Hot flashes 01/11/2016   Hypothyroidism    Mixed incontinence    MS (multiple sclerosis) (HCC)    Pancreatitis    PONV (postoperative nausea and vomiting)    PTSD (post-traumatic stress disorder)    Thyroid disease    Trauma    Urge urinary incontinence 05/10/2015   Vaginal bleeding 01/11/2016    Past Surgical History:  Procedure Laterality Date   BIOPSY  12/07/2015   Procedure: BIOPSY;  Surgeon: West Bali, MD;  Location: [facility];  Service: Endoscopy;;  right  and left colon biopsies, gastric bx's    COLONOSCOPY WITH PROPOFOL N/A 12/07/2015   Dr. Darrick Penna: mild proctitis, hyperplastic polyp    ENDOMETRIAL ABLATION     ESOPHAGOGASTRODUODENOSCOPY (EGD) WITH PROPOFOL N/A 12/07/2015   Dr. 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    INCONTINENCE SURGERY     KNEE ARTHROSCOPY WITH MEDIAL MENISECTOMY Left 11/17/2021   Procedure: KNEE ARTHROSCOPY WITH MEDIAL MENISECTOMY;  Surgeon: Oliver Barre, MD;  Location: [facility];  Service: Orthopedics;  Laterality: Left;   SAVORY DILATION N/A 12/07/2015   Procedure: SAVORY DILATION;  Surgeon: West Bali, MD;  Location: [facility];  Service: Endoscopy;  Laterality: N/A;   TUBAL LIGATION      Current Outpatient Medications  Medication Sig Dispense Refill   Acetaminophen (TYLENOL PO) Take by mouth.     cetirizine (ZYRTEC) 10 MG tablet Take 10 mg by mouth daily.     Cholecalciferol (VITAMIN D-3 PO) Take 5,000 Units by mouth daily.     cyanocobalamin (VITAMIN B12) 1000 MCG tablet Take 1,000 mcg by mouth daily.     escitalopram (LEXAPRO) 20 MG tablet Take 20 mg by mouth daily.     hydrochlorothiazide (HYDRODIURIL) 12.5 MG tablet Take 12.5 mg by mouth daily.     levothyroxine (SYNTHROID) 75 MCG tablet Take 1 tablet (75 mcg total) by mouth daily before breakfast. 90 tablet 3  linaclotide (LINZESS) 290 MCG CAPS capsule Take 1 capsule (290 mcg total) by mouth daily before breakfast. 30 capsule 0   omeprazole (PRILOSEC) 20 MG capsule Take 40 mg by mouth daily.     polyethylene glycol (MIRALAX / GLYCOLAX) 17 g packet Take 17 g by mouth daily.     TOVIAZ 8 MG TB24 tablet Take 8 mg by mouth daily.     No current facility-administered medications for this visit.    Allergies as of 05/02/2023 - Review Complete 05/02/2023  Allergen Reaction Noted   Ibuprofen Other (See Comments) 05/30/2021   Nsaids Other (See Comments) 01/17/2016   Tuberculin tests Dermatitis 01/18/2015     Family History  Problem Relation Age of Onset   Diabetes Father    Heart disease Father    Hypertension Sister    Heart disease Sister    Stroke Paternal Aunt    Dementia Paternal Aunt    Hypertension Paternal Aunt    Heart disease Paternal Aunt    Seizures Paternal Aunt    Breast cancer Paternal Aunt    Stroke Paternal Grandmother    Heart disease Paternal Grandmother    Depression Daughter    Depression Daughter    Depression Son    ADD / ADHD Son    Colon cancer Neg Hx    Colon polyps Neg Hx     Social History   Socioeconomic History   Marital status: Divorced    Spouse name: Not on file   Number of children: Not on file   Years of education: Not on file   Highest education level: Not on file  Occupational History   Occupation: disability  Tobacco Use   Smoking status: Never   Smokeless tobacco: Never  Vaping Use   Vaping status: Never Used  Substance and Sexual Activity   Alcohol use: No    Alcohol/week: 0.0 standard drinks of alcohol   Drug use: No   Sexual activity: Not Currently    Birth control/protection: Surgical    Comment: tubal and ablation  Other Topics Concern   Not on file  Social History Narrative   Are you right handed or left handed?    Are you currently employed ?    What is your current occupation?   Do you live at home alone?   Who lives with you?    What type of home do you live in: 1 story or 2 story? 1       Social Determinants of Health   Financial Resource Strain: Low Risk  (11/02/2022)   Overall Financial Resource Strain (CARDIA)    Difficulty of Paying Living Expenses: Not hard at all  Food Insecurity: Low Risk  (05/01/2023)   Received from [facility]   Hunger Vital Sign    Worried About Running Out of Food in the Last Year: Never true    Ran Out of Food in the Last Year: Never true  Transportation Needs: No Transportation Needs (05/01/2023)   Received from [facility]    In the past 12 months,  has lack of reliable transportation kept you from medical appointments, meetings, work or from getting things needed for daily living? : No  Physical Activity: Inactive (11/02/2022)   Exercise Vital Sign    Days of Exercise per Week: 0 days    Minutes of Exercise per Session: 10 min  Stress: Stress Concern Present (11/02/2022)   [employer] of Occupational Health - Occupational Stress Questionnaire  Feeling of Stress : To some extent  Social Connections: Socially Isolated (11/02/2022)   Social Connection and Isolation Panel [NHANES]    Frequency of Communication with Friends and Family: Three times a week    Frequency of Social Gatherings with Friends and Family: Once a week    Attends Religious Services: Never    Database administrator or Organizations: No    Attends Banker Meetings: Never    Marital Status: Divorced  Catering manager Violence: Not At Risk (11/02/2022)   Humiliation, Afraid, Rape, and Kick questionnaire    Fear of Current or Ex-Partner: No    Emotionally Abused: No    Physically Abused: No    Sexually Abused: No     Review of Systems   Gen: Denies any fever, chills, fatigue, weight loss, lack of appetite.  CV: Denies chest pain, heart palpitations, peripheral edema, syncope.  Resp: Denies shortness of breath at rest or with exertion. Denies wheezing or cough.  GI: Denies dysphagia or odynophagia. Denies jaundice, hematemesis, fecal incontinence. GU : Denies urinary burning, urinary frequency, urinary hesitancy MS: Denies joint pain, muscle weakness, cramps, or limitation of movement.  Derm: Denies rash, itching, dry skin Psych: Denies depression, anxiety, memory loss, and confusion Heme: Denies bruising, bleeding, and enlarged lymph nodes.   Physical Exam   BP 120/76 (BP Location: Left Arm, Patient Position: Sitting, Cuff Size: Large)   Pulse 69   Temp 97.8 F (36.6 C) (Temporal)   Ht 5\' 4"  (1.626 m)   Wt 215 lb (97.5 kg)   BMI 36.90 kg/m   General:   Alert and oriented. Pleasant and cooperative. Well-nourished and well-developed.  Head:  Normocephalic and atraumatic. Eyes:  Without icterus Cardiac: S1 S2 present, regular Abdomen:  +BS, soft, non-tender and non-distended. No HSM noted. No guarding or rebound. No masses appreciated.  Rectal:  Deferred  Msk:  Symmetrical without gross deformities. Normal posture. Extremities:  Without edema. Neurologic:  Alert and  oriented x4;  grossly normal neurologically. Skin:  Intact without significant lesions or rashes. Psych:  Alert and cooperative. Normal mood and affect.   Assessment   Dawn Barnes is a 57 y.o. female presenting today with a history of GERD, chronic dysphagia, chronic constipation. Colonoscopy with mild proctitis in setting of NSAIDs 2017. Edentulous. Last EGD in May 2017.   GERD: continue omeprazole daily. Continue diet/behavior modification. No alarm signs/symptoms.  Constipation: not ideally controlled despite Linzess 290 mcg daily and Miralax. Noting bloating. Will trial Amitiza 24 mcg po BID with food.   Heart palpitations: reported recently. No shortness of breath. Follow-up with PCP. ED precautions discussed   PLAN   Omeprazole daily Stop Linzess. Trial Amitiza 24 mcg po BID with food Take Miralax just prn Return in Jan    Gelene Mink, PhD, [facility] Gastroenterology

## 2023-05-03 ENCOUNTER — Ambulatory Visit (HOSPITAL_COMMUNITY)
Admission: RE | Admit: 2023-05-03 | Discharge: 2023-05-03 | Disposition: A | Discharge status: Home / Self Care | Payer: Medicaid Other | Source: Ambulatory Visit | Attending: Internal Medicine | Admitting: Internal Medicine

## 2023-05-03 DIAGNOSIS — R002 Palpitations: Secondary | ICD-10-CM | POA: Insufficient documentation

## 2023-08-22 ENCOUNTER — Other Ambulatory Visit: Payer: Self-pay | Admitting: Gastroenterology

## 2023-09-10 ENCOUNTER — Other Ambulatory Visit (HOSPITAL_COMMUNITY): Payer: Self-pay | Admitting: Internal Medicine

## 2023-09-10 DIAGNOSIS — Z1231 Encounter for screening mammogram for malignant neoplasm of breast: Secondary | ICD-10-CM

## 2023-09-19 ENCOUNTER — Ambulatory Visit (HOSPITAL_COMMUNITY): Payer: Medicaid Other

## 2023-09-27 ENCOUNTER — Encounter: Payer: Self-pay | Admitting: *Deleted

## 2023-09-27 ENCOUNTER — Ambulatory Visit (HOSPITAL_COMMUNITY)
Admission: RE | Admit: 2023-09-27 | Discharge: 2023-09-27 | Disposition: A | Discharge status: Home / Self Care | Payer: Medicaid Other | Source: Ambulatory Visit | Attending: Internal Medicine | Admitting: Internal Medicine

## 2023-09-27 ENCOUNTER — Ambulatory Visit: Payer: Medicaid Other | Admitting: Gastroenterology

## 2023-09-27 ENCOUNTER — Encounter (HOSPITAL_COMMUNITY): Payer: Self-pay

## 2023-09-27 ENCOUNTER — Encounter: Payer: Self-pay | Admitting: Gastroenterology

## 2023-09-27 VITALS — BP 116/79 | HR 84 | Temp 98.0°F | Ht 64.0 in | Wt 213.0 lb

## 2023-09-27 DIAGNOSIS — R1012 Left upper quadrant pain: Secondary | ICD-10-CM | POA: Diagnosis not present

## 2023-09-27 DIAGNOSIS — K5900 Constipation, unspecified: Secondary | ICD-10-CM

## 2023-09-27 DIAGNOSIS — R1310 Dysphagia, unspecified: Secondary | ICD-10-CM

## 2023-09-27 DIAGNOSIS — Z1231 Encounter for screening mammogram for malignant neoplasm of breast: Secondary | ICD-10-CM | POA: Diagnosis present

## 2023-09-27 DIAGNOSIS — R1011 Right upper quadrant pain: Secondary | ICD-10-CM | POA: Diagnosis not present

## 2023-09-27 DIAGNOSIS — R1010 Upper abdominal pain, unspecified: Secondary | ICD-10-CM

## 2023-09-27 DIAGNOSIS — R109 Unspecified abdominal pain: Secondary | ICD-10-CM | POA: Insufficient documentation

## 2023-09-27 NOTE — Patient Instructions (Signed)
 We are arranging an upper endoscopy with dilation in the near future.   I have also ordered an ultrasound of your gallbladder.  Let's do a bowel purge to "reset". Take 1 capful of Miralax in 8 ounces of water every hour for 6 doses.   You can then keep taking the Amitiza and take Miralax one capful each morning, titrating this to hopefully have a good bowel movement every other day to daily.  We will see you in 3 months!  I enjoyed seeing you again today! I value our relationship and want to provide genuine, compassionate, and quality care. You may receive a survey regarding your visit with me, and I welcome your feedback! Thanks so much for taking the time to complete this. I look forward to seeing you again.      Gelene Mink, PhD, ANP-BC [facility] Gastroenterology

## 2023-09-27 NOTE — H&P (View-Only) (Signed)
 Gastroenterology Office Note     Primary Care Physician:  Benetta Spar, MD  Primary Gastroenterologist: Dr. Marletta Lor   Chief Complaint   Chief Complaint  Patient presents with   Follow-up    States that her stomach hurts after she eats sometimes.     History of Present Illness   Dawn Barnes is a 58 y.o. female presenting today with a history of GERD, chronic dysphagia, chronic constipation. Colonoscopy with mild proctitis in setting of NSAIDs 2017. Edentulous. Last EGD in May 2017.   Sometimes upper abdominal pain postprandially. GERD worsened. Feels like food is not going down well at times. Has gas at times. Gallbladder still present. Will have intermittent RUQ pain as well. Known gallstones on CT in 2019.   Amitiza 24 mcg po BID has helped some. BM only once a week. Took Miralax daily for a span of time and only slight improvement. Not a high fiber diet. Has taken Linzess in the past and stopped working. Wanting to hold off on other agent right now.    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   Past Medical History:  Diagnosis Date   Anemia    Anxiety    Constipation 04/30/2013   Depression    Diabetes mellitus without complication (HCC)    borderline   Fibroids, intramural 01/21/2016   GERD (gastroesophageal reflux disease)    Heartburn    Hot flashes 01/11/2016   Hypothyroidism    Mixed incontinence    MS (multiple sclerosis) (HCC)    Pancreatitis    PONV (postoperative nausea and vomiting)    PTSD (post-traumatic stress disorder)    Thyroid disease    Trauma    Urge urinary incontinence 05/10/2015   Vaginal bleeding 01/11/2016    Past Surgical History:  Procedure Laterality Date   BIOPSY  12/07/2015   Procedure: BIOPSY;  Surgeon: West Bali, MD;  Location: [facility];  Service: Endoscopy;;  right and left colon biopsies,  gastric bx's    COLONOSCOPY WITH PROPOFOL N/A 12/07/2015   Dr. Darrick Penna: mild proctitis, hyperplastic polyp    ENDOMETRIAL ABLATION     ESOPHAGOGASTRODUODENOSCOPY (EGD) WITH PROPOFOL N/A 12/07/2015   Dr. 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    INCONTINENCE SURGERY     KNEE ARTHROSCOPY WITH MEDIAL MENISECTOMY Left 11/17/2021   Procedure: KNEE ARTHROSCOPY WITH MEDIAL MENISECTOMY;  Surgeon: Oliver Barre, MD;  Location: [facility];  Service: Orthopedics;  Laterality: Left;   SAVORY DILATION N/A 12/07/2015   Procedure: SAVORY DILATION;  Surgeon: West Bali, MD;  Location: [facility];  Service: Endoscopy;  Laterality: N/A;   TUBAL LIGATION      Current Outpatient Medications  Medication Sig Dispense Refill   Acetaminophen (TYLENOL PO) Take by mouth.     cetirizine (ZYRTEC) 10 MG tablet Take 10 mg by mouth daily.     Cholecalciferol (VITAMIN D-3 PO) Take 5,000 Units by mouth daily.     cyanocobalamin (VITAMIN B12) 1000 MCG tablet Take 1,000 mcg by mouth daily.     escitalopram (LEXAPRO) 20 MG tablet Take 20 mg by mouth daily.     hydrochlorothiazide (HYDRODIURIL) 12.5 MG tablet Take 12.5 mg by mouth daily.     levothyroxine (SYNTHROID) 75 MCG tablet Take 1 tablet (75 mcg total) by mouth daily before breakfast. 90 tablet 3   lubiprostone (AMITIZA)  24 MCG capsule TAKE ONE CAPSULE BY MOUTH TWICE DAILY WITH MEALS 60 capsule 3   omeprazole (PRILOSEC) 20 MG capsule Take 40 mg by mouth daily.     polyethylene glycol (MIRALAX / GLYCOLAX) 17 g packet Take 17 g by mouth daily.     TOVIAZ 8 MG TB24 tablet Take 8 mg by mouth daily.     No current facility-administered medications for this visit.    Allergies as of 09/27/2023 - Review Complete 09/27/2023  Allergen Reaction Noted   Ibuprofen Other (See Comments) 05/30/2021   Nsaids Other (See Comments) 01/17/2016   Tuberculin tests Dermatitis 01/18/2015    Family History  Problem Relation Age  of Onset   Diabetes Father    Heart disease Father    Hypertension Sister    Heart disease Sister    Stroke Paternal Aunt    Dementia Paternal Aunt    Hypertension Paternal Aunt    Heart disease Paternal Aunt    Seizures Paternal Aunt    Breast cancer Paternal Aunt    Stroke Paternal Grandmother    Heart disease Paternal Grandmother    Depression Daughter    Depression Daughter    Depression Son    ADD / ADHD Son    Colon cancer Neg Hx    Colon polyps Neg Hx     Social History   Socioeconomic History   Marital status: Divorced    Spouse name: Not on file   Number of children: Not on file   Years of education: Not on file   Highest education level: Not on file  Occupational History   Occupation: disability  Tobacco Use   Smoking status: Never   Smokeless tobacco: Never  Vaping Use   Vaping status: Never Used  Substance and Sexual Activity   Alcohol use: No    Alcohol/week: 0.0 standard drinks of alcohol   Drug use: No   Sexual activity: Not Currently    Birth control/protection: Surgical    Comment: tubal and ablation  Other Topics Concern   Not on file  Social History Narrative   Are you right handed or left handed?    Are you currently employed ?    What is your current occupation?   Do you live at home alone?   Who lives with you?    What type of home do you live in: 1 story or 2 story? 1       Social Drivers of Corporate investment banker Strain: Low Risk  (11/02/2022)   Overall Financial Resource Strain (CARDIA)    Difficulty of Paying Living Expenses: Not hard at all  Food Insecurity: Low Risk  (05/01/2023)   Received from [facility]   Hunger Vital Sign    Worried About Running Out of Food in the Last Year: Never true    Ran Out of Food in the Last Year: Never true  Transportation Needs: No Transportation Needs (05/01/2023)   Received from [facility]    In the past 12 months, has lack of reliable transportation kept you from  medical appointments, meetings, work or from getting things needed for daily living? : No  Physical Activity: Inactive (11/02/2022)   Exercise Vital Sign    Days of Exercise per Week: 0 days    Minutes of Exercise per Session: 10 min  Stress: Stress Concern Present (11/02/2022)   [employer] of Occupational Health - Occupational Stress Questionnaire    Feeling of Stress :  To some extent  Social Connections: Socially Isolated (11/02/2022)   Social Connection and Isolation Panel [NHANES]    Frequency of Communication with Friends and Family: Three times a week    Frequency of Social Gatherings with Friends and Family: Once a week    Attends Religious Services: Never    Database administrator or Organizations: No    Attends Banker Meetings: Never    Marital Status: Divorced  Catering manager Violence: Not At Risk (11/02/2022)   Humiliation, Afraid, Rape, and Kick questionnaire    Fear of Current or Ex-Partner: No    Emotionally Abused: No    Physically Abused: No    Sexually Abused: No     Review of Systems   Gen: Denies any fever, chills, fatigue, weight loss, lack of appetite.  CV: Denies chest pain, heart palpitations, peripheral edema, syncope.  Resp: Denies shortness of breath at rest or with exertion. Denies wheezing or cough.  GI: see HPI GU : Denies urinary burning, urinary frequency, urinary hesitancy MS: Denies joint pain, muscle weakness, cramps, or limitation of movement.  Derm: Denies rash, itching, dry skin Psych: Denies depression, anxiety, memory loss, and confusion Heme: Denies bruising, bleeding, and enlarged lymph nodes.   Physical Exam   BP 116/79 (BP Location: Right Arm, Patient Position: Sitting, Cuff Size: Large)   Pulse 84   Temp 98 F (36.7 C) (Oral)   Ht 5\' 4"  (1.626 m)   Wt 213 lb (96.6 kg)   LMP  (LMP Unknown)   SpO2 95%   BMI 36.56 kg/m  General:   Alert and oriented. Pleasant and cooperative. Well-nourished and well-developed.  Edentulous Head:  Normocephalic and atraumatic. Eyes:  Without icterus Abdomen:  +BS, soft, non-tender and non-distended. No HSM noted. No guarding or rebound. No masses appreciated.  Rectal:  Deferred  Msk:  Symmetrical without gross deformities. Normal posture. Extremities:  Without edema. Neurologic:  Alert and  oriented x4;  grossly normal neurologically. Skin:  Intact without significant lesions or rashes. Psych:  Alert and cooperative. Normal mood and affect.   Assessment   Dawn Barnes is a 58 y.o. female presenting today with a history of  GERD, chronic dysphagia, chronic constipation. Colonoscopy with mild proctitis in setting of NSAIDs 2017. Edentulous. Last EGD in May 2017 with proximal web s/p dilation.   Dysphagia: worsening and also noting postprandial abdominal pain and worsening GERD. Although edentulous status contributing, she has history of an esophageal web with improvement s/p dilation. Will arrange EGD/dilation in near future.  Abdominal pain: epigastric, LUQ and RUQ. Postprandial component. Gallbladder in situ with gallstones noted on CT several years ago. Update US abdomen.   Constipation: not ideally managed with Linzess historically. Minor improvement with Amitiza 24 mcg po BID. Will do miralax purge today to "reset", then resume Amitiza and add Miralax daily. She is not wanting to try another agent at this time. Recommend motegrity if no improvement.      PLAN   Proceed with upper endoscopy/dilation by Dr. Marletta Lor in near future: the risks, benefits, and alternatives have been discussed with the patient in detail. The patient states understanding and desires to proceed.   Continue PPI daily  Miralax bowel prep today, then resume Amitiza 24 mcg po BID and take Miralax daily with this  RUQ [place]  Consider Motegrity if patient willing in future  3 month follow-up  Gelene Mink, PhD, ANP-BC [facility]

## 2023-09-27 NOTE — Progress Notes (Signed)
 Gastroenterology Office Note     Primary Care Physician:  Benetta Spar, MD  Primary Gastroenterologist: Dr. Marletta Lor   Chief Complaint   Chief Complaint  Patient presents with   Follow-up    States that her stomach hurts after she eats sometimes.     History of Present Illness   Dawn Barnes is a 58 y.o. female presenting today with a history of GERD, chronic dysphagia, chronic constipation. Colonoscopy with mild proctitis in setting of NSAIDs 2017. Edentulous. Last EGD in May 2017.   Sometimes upper abdominal pain postprandially. GERD worsened. Feels like food is not going down well at times. Has gas at times. Gallbladder still present. Will have intermittent RUQ pain as well. Known gallstones on CT in 2019.   Amitiza 24 mcg po BID has helped some. BM only once a week. Took Miralax daily for a span of time and only slight improvement. Not a high fiber diet. Has taken Linzess in the past and stopped working. Wanting to hold off on other agent right now.    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   Past Medical History:  Diagnosis Date   Anemia    Anxiety    Constipation 04/30/2013   Depression    Diabetes mellitus without complication (HCC)    borderline   Fibroids, intramural 01/21/2016   GERD (gastroesophageal reflux disease)    Heartburn    Hot flashes 01/11/2016   Hypothyroidism    Mixed incontinence    MS (multiple sclerosis) (HCC)    Pancreatitis    PONV (postoperative nausea and vomiting)    PTSD (post-traumatic stress disorder)    Thyroid disease    Trauma    Urge urinary incontinence 05/10/2015   Vaginal bleeding 01/11/2016    Past Surgical History:  Procedure Laterality Date   BIOPSY  12/07/2015   Procedure: BIOPSY;  Surgeon: West Bali, MD;  Location: [facility];  Service: Endoscopy;;  right and left colon biopsies,  gastric bx's    COLONOSCOPY WITH PROPOFOL N/A 12/07/2015   Dr. Darrick Penna: mild proctitis, hyperplastic polyp    ENDOMETRIAL ABLATION     ESOPHAGOGASTRODUODENOSCOPY (EGD) WITH PROPOFOL N/A 12/07/2015   Dr. 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    INCONTINENCE SURGERY     KNEE ARTHROSCOPY WITH MEDIAL MENISECTOMY Left 11/17/2021   Procedure: KNEE ARTHROSCOPY WITH MEDIAL MENISECTOMY;  Surgeon: Oliver Barre, MD;  Location: [facility];  Service: Orthopedics;  Laterality: Left;   SAVORY DILATION N/A 12/07/2015   Procedure: SAVORY DILATION;  Surgeon: West Bali, MD;  Location: [facility];  Service: Endoscopy;  Laterality: N/A;   TUBAL LIGATION      Current Outpatient Medications  Medication Sig Dispense Refill   Acetaminophen (TYLENOL PO) Take by mouth.     cetirizine (ZYRTEC) 10 MG tablet Take 10 mg by mouth daily.     Cholecalciferol (VITAMIN D-3 PO) Take 5,000 Units by mouth daily.     cyanocobalamin (VITAMIN B12) 1000 MCG tablet Take 1,000 mcg by mouth daily.     escitalopram (LEXAPRO) 20 MG tablet Take 20 mg by mouth daily.     hydrochlorothiazide (HYDRODIURIL) 12.5 MG tablet Take 12.5 mg by mouth daily.     levothyroxine (SYNTHROID) 75 MCG tablet Take 1 tablet (75 mcg total) by mouth daily before breakfast. 90 tablet 3   lubiprostone (AMITIZA)  24 MCG capsule TAKE ONE CAPSULE BY MOUTH TWICE DAILY WITH MEALS 60 capsule 3   omeprazole (PRILOSEC) 20 MG capsule Take 40 mg by mouth daily.     polyethylene glycol (MIRALAX / GLYCOLAX) 17 g packet Take 17 g by mouth daily.     TOVIAZ 8 MG TB24 tablet Take 8 mg by mouth daily.     No current facility-administered medications for this visit.    Allergies as of 09/27/2023 - Review Complete 09/27/2023  Allergen Reaction Noted   Ibuprofen Other (See Comments) 05/30/2021   Nsaids Other (See Comments) 01/17/2016   Tuberculin tests Dermatitis 01/18/2015    Family History  Problem Relation Age  of Onset   Diabetes Father    Heart disease Father    Hypertension Sister    Heart disease Sister    Stroke Paternal Aunt    Dementia Paternal Aunt    Hypertension Paternal Aunt    Heart disease Paternal Aunt    Seizures Paternal Aunt    Breast cancer Paternal Aunt    Stroke Paternal Grandmother    Heart disease Paternal Grandmother    Depression Daughter    Depression Daughter    Depression Son    ADD / ADHD Son    Colon cancer Neg Hx    Colon polyps Neg Hx     Social History   Socioeconomic History   Marital status: Divorced    Spouse name: Not on file   Number of children: Not on file   Years of education: Not on file   Highest education level: Not on file  Occupational History   Occupation: disability  Tobacco Use   Smoking status: Never   Smokeless tobacco: Never  Vaping Use   Vaping status: Never Used  Substance and Sexual Activity   Alcohol use: No    Alcohol/week: 0.0 standard drinks of alcohol   Drug use: No   Sexual activity: Not Currently    Birth control/protection: Surgical    Comment: tubal and ablation  Other Topics Concern   Not on file  Social History Narrative   Are you right handed or left handed?    Are you currently employed ?    What is your current occupation?   Do you live at home alone?   Who lives with you?    What type of home do you live in: 1 story or 2 story? 1       Social Drivers of Corporate investment banker Strain: Low Risk  (11/02/2022)   Overall Financial Resource Strain (CARDIA)    Difficulty of Paying Living Expenses: Not hard at all  Food Insecurity: Low Risk  (05/01/2023)   Received from [facility]   Hunger Vital Sign    Worried About Running Out of Food in the Last Year: Never true    Ran Out of Food in the Last Year: Never true  Transportation Needs: No Transportation Needs (05/01/2023)   Received from [facility]    In the past 12 months, has lack of reliable transportation kept you from  medical appointments, meetings, work or from getting things needed for daily living? : No  Physical Activity: Inactive (11/02/2022)   Exercise Vital Sign    Days of Exercise per Week: 0 days    Minutes of Exercise per Session: 10 min  Stress: Stress Concern Present (11/02/2022)   [employer] of Occupational Health - Occupational Stress Questionnaire    Feeling of Stress :  To some extent  Social Connections: Socially Isolated (11/02/2022)   Social Connection and Isolation Panel [NHANES]    Frequency of Communication with Friends and Family: Three times a week    Frequency of Social Gatherings with Friends and Family: Once a week    Attends Religious Services: Never    Database administrator or Organizations: No    Attends Banker Meetings: Never    Marital Status: Divorced  Catering manager Violence: Not At Risk (11/02/2022)   Humiliation, Afraid, Rape, and Kick questionnaire    Fear of Current or Ex-Partner: No    Emotionally Abused: No    Physically Abused: No    Sexually Abused: No     Review of Systems   Gen: Denies any fever, chills, fatigue, weight loss, lack of appetite.  CV: Denies chest pain, heart palpitations, peripheral edema, syncope.  Resp: Denies shortness of breath at rest or with exertion. Denies wheezing or cough.  GI: see HPI GU : Denies urinary burning, urinary frequency, urinary hesitancy MS: Denies joint pain, muscle weakness, cramps, or limitation of movement.  Derm: Denies rash, itching, dry skin Psych: Denies depression, anxiety, memory loss, and confusion Heme: Denies bruising, bleeding, and enlarged lymph nodes.   Physical Exam   BP 116/79 (BP Location: Right Arm, Patient Position: Sitting, Cuff Size: Large)   Pulse 84   Temp 98 F (36.7 C) (Oral)   Ht 5\' 4"  (1.626 m)   Wt 213 lb (96.6 kg)   LMP  (LMP Unknown)   SpO2 95%   BMI 36.56 kg/m  General:   Alert and oriented. Pleasant and cooperative. Well-nourished and well-developed.  Edentulous Head:  Normocephalic and atraumatic. Eyes:  Without icterus Abdomen:  +BS, soft, non-tender and non-distended. No HSM noted. No guarding or rebound. No masses appreciated.  Rectal:  Deferred  Msk:  Symmetrical without gross deformities. Normal posture. Extremities:  Without edema. Neurologic:  Alert and  oriented x4;  grossly normal neurologically. Skin:  Intact without significant lesions or rashes. Psych:  Alert and cooperative. Normal mood and affect.   Assessment   Dawn Barnes is a 58 y.o. female presenting today with a history of  GERD, chronic dysphagia, chronic constipation. Colonoscopy with mild proctitis in setting of NSAIDs 2017. Edentulous. Last EGD in May 2017 with proximal web s/p dilation.   Dysphagia: worsening and also noting postprandial abdominal pain and worsening GERD. Although edentulous status contributing, she has history of an esophageal web with improvement s/p dilation. Will arrange EGD/dilation in near future.  Abdominal pain: epigastric, LUQ and RUQ. Postprandial component. Gallbladder in situ with gallstones noted on CT several years ago. Update US abdomen.   Constipation: not ideally managed with Linzess historically. Minor improvement with Amitiza 24 mcg po BID. Will do miralax purge today to "reset", then resume Amitiza and add Miralax daily. She is not wanting to try another agent at this time. Recommend motegrity if no improvement.      PLAN   Proceed with upper endoscopy/dilation by Dr. Marletta Lor in near future: the risks, benefits, and alternatives have been discussed with the patient in detail. The patient states understanding and desires to proceed.   Continue PPI daily  Miralax bowel prep today, then resume Amitiza 24 mcg po BID and take Miralax daily with this  RUQ [place]  Consider Motegrity if patient willing in future  3 month follow-up  Gelene Mink, PhD, ANP-BC [facility]

## 2023-09-28 ENCOUNTER — Telehealth: Payer: Self-pay | Admitting: *Deleted

## 2023-09-28 ENCOUNTER — Encounter: Payer: Self-pay | Admitting: *Deleted

## 2023-09-28 NOTE — Telephone Encounter (Signed)
 Pt informed of [place] appointment on  10/04/23 at [facility], arrive at 8:45 am to check in, NPO after midnight. She has been scheduled for EGD/ED for 10/22/23 instructions mailed.Marland Kitchen  [employer] [id number]: PENDED Reason: 1.Disposition pending review Tracking #: [id number]

## 2023-10-01 ENCOUNTER — Encounter: Payer: Self-pay | Admitting: *Deleted

## 2023-10-02 ENCOUNTER — Other Ambulatory Visit (HOSPITAL_COMMUNITY): Payer: Self-pay | Admitting: Internal Medicine

## 2023-10-02 DIAGNOSIS — R928 Other abnormal and inconclusive findings on diagnostic imaging of breast: Secondary | ICD-10-CM

## 2023-10-02 NOTE — Telephone Encounter (Signed)
 [employer] PA:  Approval # [id number] Start date 10/22/2023 End date 01/20/2024

## 2023-10-04 ENCOUNTER — Ambulatory Visit (HOSPITAL_COMMUNITY)
Admission: RE | Admit: 2023-10-04 | Discharge: 2023-10-04 | Disposition: A | Discharge status: Home / Self Care | Payer: Medicaid Other | Source: Ambulatory Visit | Attending: Gastroenterology | Admitting: Gastroenterology

## 2023-10-04 DIAGNOSIS — R1010 Upper abdominal pain, unspecified: Secondary | ICD-10-CM | POA: Insufficient documentation

## 2023-10-16 NOTE — Patient Instructions (Signed)
 Dawn Barnes  10/16/2023     @PREFPERIOPPHARMACY @   Your procedure is scheduled on  10/22/2023.   Report to [facility] at  0930 A.M.   Call this number if you have problems the morning of surgery:  [phone]  If you experience any cold or flu symptoms such as cough, fever, chills, shortness of breath, etc. between now and your scheduled surgery, please notify us at the above number.   Remember:        DO NOT take any medications for diabetes the morning of your procedure.   Follow the diet instructions given to you by the office.   You may drink clear liquids until  0730 am on 10/22/2023.    Clear liquids allowed are:                    Water, Juice (No red color; non-citric and without pulp; diabetics please choose diet or no sugar options), Carbonated beverages (diabetics please choose diet or no sugar options), Clear Tea (No creamer, milk, or cream, including half & half and powdered creamer), Black Coffee Only (No creamer, milk or cream, including half & half and powdered creamer), and Clear Sports drink (No red color; diabetics please choose diet or no sugar options)    Take these medicines the morning of surgery with A SIP OF WATER                    escitalopram, levothyroxine, omeprazole, toviaz.     Do not wear jewelry, make-up or nail polish, including gel polish,  artificial nails, or any other type of covering on natural nails (fingers and  toes).  Do not wear lotions, powders, or perfumes, or deodorant.  Do not shave 48 hours prior to surgery.  Men may shave face and neck.  Do not bring valuables to the hospital.  [facility] is not responsible for any belongings or valuables.  Contacts, dentures or bridgework may not be worn into surgery.  Leave your suitcase in the car.  After surgery it may be brought to your room.  For patients admitted to the hospital, discharge time will be determined by your treatment team.  Patients discharged the day of  surgery will not be allowed to drive home and must have someone with them for 24 hours.    Special instructions:   DO NOT smoke tobacco or vape for 24 hours before your procedure.  Please read over the following fact sheets that you were given. Anesthesia Post-op Instructions and Care and Recovery After Surgery      Upper Endoscopy, Adult, Care After After the procedure, it is common to have a sore throat. It is also common to have: Mild stomach pain or discomfort. Bloating. Nausea. Follow these instructions at home: The instructions below may help you care for yourself at home. Your health care provider may give you more instructions. If you have questions, ask your health care provider. If you were given a sedative during the procedure, it can affect you for several hours. Do not drive or operate machinery until your health care provider says that it is safe. If you will be going home right after the procedure, plan to have a responsible adult: Take you home from the hospital or clinic. You will not be allowed to drive. Care for you for the time you are told. Follow instructions from your health care provider about what you may  eat and drink. Return to your normal activities as told by your health care provider. Ask your health care provider what activities are safe for you. Take over-the-counter and prescription medicines only as told by your health care provider. Contact a health care provider if you: Have a sore throat that lasts longer than one day. Have trouble swallowing. Have a fever. Get help right away if you: Vomit blood or your vomit looks like coffee grounds. Have bloody, black, or tarry stools. Have a very bad sore throat or you cannot swallow. Have difficulty breathing or very bad pain in your chest or abdomen. These symptoms may be an emergency. Get help right away. Call 911. Do not wait to see if the symptoms will go away. Do not drive yourself to the  hospital. Summary After the procedure, it is common to have a sore throat, mild stomach discomfort, bloating, and nausea. If you were given a sedative during the procedure, it can affect you for several hours. Do not drive until your health care provider says that it is safe. Follow instructions from your health care provider about what you may eat and drink. Return to your normal activities as told by your health care provider. This information is not intended to replace advice given to you by your health care provider. Make sure you discuss any questions you have with your health care provider. Document Revised: 10/26/2021 Document Reviewed: 10/26/2021 Elsevier Patient Education  2024 [employer].Esophageal Dilatation Esophageal dilatation, or dilation, is done to stretch a blocked or narrowed part of your esophagus. The esophagus is the part of your body that moves food from your mouth to your stomach. You may need to have it stretched if: You have a lot of scar tissue and it makes it hard or painful to swallow. You have cancer of the esophagus. There's a problem with how food moves through your esophagus. In some cases, you may need to have this procedure done more than once. Tell a health care provider about: Any allergies you have. All medicines you're taking, including vitamins, herbs, eye drops, creams, and over-the-counter medicines. Any problems you or family members have had with anesthesia. Any bleeding problems you have. Any surgeries you've had. Any medical conditions you have. Whether you're pregnant or may be pregnant. What are the risks? Your health care provider will talk with you about risks. These may include: Bleeding. A hole or tear in your esophagus. What happens before the procedure? When to stop eating and drinking Follow instructions from your provider about what you may eat and drink. These may include: 8 hours before your procedure Stop eating most foods.  Do not eat meat, fried foods, or fatty foods. Eat only light foods, such as toast or crackers. All liquids are okay except energy drinks and alcohol. 6 hours before your procedure Stop eating. Drink only clear liquids, such as water, clear fruit juice, black coffee, plain tea, and sports drinks. Do not drink energy drinks or alcohol. 2 hours before your procedure Stop drinking all liquids. You may be allowed to take medicines with small sips of water. If you don't follow your provider's instructions, your procedure may be delayed or canceled. Medicines Ask your provider about: Changing or stopping your regular medicines. These include any diabetes medicines or blood thinners you take. Taking medicines such as aspirin and ibuprofen. These medicines can thin your blood. Do not take them unless your provider tells you to. Taking over-the-counter medicines, vitamins, herbs, and supplements. General instructions  If you'll be going home right after the procedure, plan to have a responsible adult: Take you home from the hospital or clinic. You won't be allowed to drive. Care for you for the time you're told. What happens during the procedure? You may be given: A sedative. This helps you relax. Anesthesia. This keeps you from feeling pain. It will numb certain areas of your body. The stretching may be done with: Simple dilators. These are tools put in your esophagus to stretch it. Guide wires. These wires are put in using a tube called an endoscope. A dilator is put over the wires to stretch your esophagus. Then the wires are taken out. A balloon. The balloon is on the end of a tube. It's inflated to stretch your esophagus. The procedure may vary among providers and hospitals. What can I expect after the procedure? Your blood pressure, heart rate, breathing rate, and blood oxygen level will be monitored until you leave the hospital or clinic. Your throat may feel sore and numb. This will get  better over time. You won't be allowed to eat or drink until your throat is no longer numb. You may be able to go home when you can: Drink. Pee. Sit on the edge of the bed without nausea or dizziness. Follow these instructions at home: Activity If you were given a sedative during the procedure, it can affect you for several hours. Do not drive or operate machinery until your provider says it's safe. Return to your normal activities as told by your provider. Ask your provider what activities are safe for you. General instructions Take over-the-counter and prescription medicines only as told by your provider. Follow instructions from your provider about what you may eat and drink. Do not use any products that contain nicotine or tobacco. These products include cigarettes, chewing tobacco, and vaping devices, such as e-cigarettes. If you need help quitting, ask your provider. Keep all follow-up visits. Your provider will make sure the procedure worked. Where to find more information American Society for Gastrointestinal Endoscopy (ASGE): asge.org Contact a health care provider if: You have trouble swallowing. You have a fever. Your pain doesn't get better with medicine. Get help right away if: You have chest pain. You have trouble breathing. You vomit blood. Your poop is: Black. Tarry. Bloody. These symptoms may be an emergency. Get help right away. Call 911. Do not wait to see if the symptoms will go away. Do not drive yourself to the hospital. This information is not intended to replace advice given to you by your health care provider. Make sure you discuss any questions you have with your health care provider. Document Revised: 10/13/2022 Document Reviewed: 10/13/2022 Elsevier Patient Education  2024 Elsevier Inc.General Anesthesia, Adult, Care After The following information offers guidance on how to care for yourself after your procedure. Your health care provider may also give  you more specific instructions. If you have problems or questions, contact your health care provider. What can I expect after the procedure? After the procedure, it is common for people to: Have pain or discomfort at the IV site. Have nausea or vomiting. Have a sore throat or hoarseness. Have trouble concentrating. Feel cold or chills. Feel weak, sleepy, or tired (fatigue). Have soreness and body aches. These can affect parts of the body that were not involved in surgery. Follow these instructions at home: For the time period you were told by your health care provider:  Rest. Do not participate in activities where you could  fall or become injured. Do not drive or use machinery. Do not drink alcohol. Do not take sleeping pills or medicines that cause drowsiness. Do not make important decisions or sign legal documents. Do not take care of children on your own. General instructions Drink enough fluid to keep your urine pale yellow. If you have sleep apnea, surgery and certain medicines can increase your risk for breathing problems. Follow instructions from your health care provider about wearing your sleep device: Anytime you are sleeping, including during daytime naps. While taking prescription pain medicines, sleeping medicines, or medicines that make you drowsy. Return to your normal activities as told by your health care provider. Ask your health care provider what activities are safe for you. Take over-the-counter and prescription medicines only as told by your health care provider. Do not use any products that contain nicotine or tobacco. These products include cigarettes, chewing tobacco, and vaping devices, such as e-cigarettes. These can delay incision healing after surgery. If you need help quitting, ask your health care provider. Contact a health care provider if: You have nausea or vomiting that does not get better with medicine. You vomit every time you eat or drink. You have  pain that does not get better with medicine. You cannot urinate or have bloody urine. You develop a skin rash. You have a fever. Get help right away if: You have trouble breathing. You have chest pain. You vomit blood. These symptoms may be an emergency. Get help right away. Call 911. Do not wait to see if the symptoms will go away. Do not drive yourself to the hospital. Summary After the procedure, it is common to have a sore throat, hoarseness, nausea, vomiting, or to feel weak, sleepy, or fatigue. For the time period you were told by your health care provider, do not drive or use machinery. Get help right away if you have difficulty breathing, have chest pain, or vomit blood. These symptoms may be an emergency. This information is not intended to replace advice given to you by your health care provider. Make sure you discuss any questions you have with your health care provider. Document Revised: 10/14/2021 Document Reviewed: 10/14/2021 Elsevier Patient Education  2024 [employer].

## 2023-10-18 ENCOUNTER — Encounter (HOSPITAL_COMMUNITY): Payer: Self-pay

## 2023-10-18 ENCOUNTER — Other Ambulatory Visit: Payer: Self-pay

## 2023-10-18 ENCOUNTER — Encounter (HOSPITAL_COMMUNITY)
Admission: RE | Admit: 2023-10-18 | Discharge: 2023-10-18 | Disposition: A | Discharge status: Home / Self Care | Payer: Medicaid Other | Source: Ambulatory Visit | Attending: Internal Medicine | Admitting: Internal Medicine

## 2023-10-18 VITALS — BP 116/79 | HR 84 | Temp 98.0°F | Resp 18 | Ht 64.0 in | Wt 213.0 lb

## 2023-10-18 DIAGNOSIS — D649 Anemia, unspecified: Secondary | ICD-10-CM | POA: Insufficient documentation

## 2023-10-18 DIAGNOSIS — R7303 Prediabetes: Secondary | ICD-10-CM | POA: Diagnosis not present

## 2023-10-18 DIAGNOSIS — Z01818 Encounter for other preprocedural examination: Secondary | ICD-10-CM | POA: Diagnosis present

## 2023-10-18 HISTORY — DX: Essential (primary) hypertension: I10

## 2023-10-18 LAB — CBC WITH DIFFERENTIAL/PLATELET
Abs Immature Granulocytes: 0.02 10*3/uL (ref 0.00–0.07)
Basophils Absolute: 0 10*3/uL (ref 0.0–0.1)
Basophils Relative: 1 %
Eosinophils Absolute: 0.1 10*3/uL (ref 0.0–0.5)
Eosinophils Relative: 1 %
HCT: 37.2 % (ref 36.0–46.0)
Hemoglobin: 11.9 g/dL — ABNORMAL LOW (ref 12.0–15.0)
Immature Granulocytes: 0 %
Lymphocytes Relative: 30 %
Lymphs Abs: 1.4 10*3/uL (ref 0.7–4.0)
MCH: 28.7 pg (ref 26.0–34.0)
MCHC: 32 g/dL (ref 30.0–36.0)
MCV: 89.9 fL (ref 80.0–100.0)
Monocytes Absolute: 0.3 10*3/uL (ref 0.1–1.0)
Monocytes Relative: 7 %
Neutro Abs: 2.8 10*3/uL (ref 1.7–7.7)
Neutrophils Relative %: 61 %
Platelets: 238 10*3/uL (ref 150–400)
RBC: 4.14 MIL/uL (ref 3.87–5.11)
RDW: 13.7 % (ref 11.5–15.5)
WBC: 4.6 10*3/uL (ref 4.0–10.5)
nRBC: 0 % (ref 0.0–0.2)

## 2023-10-18 LAB — BASIC METABOLIC PANEL
Anion gap: 11 (ref 5–15)
BUN: 19 mg/dL (ref 6–20)
CO2: 26 mmol/L (ref 22–32)
Calcium: 9.5 mg/dL (ref 8.9–10.3)
Chloride: 100 mmol/L (ref 98–111)
Creatinine, Ser: 0.87 mg/dL (ref 0.44–1.00)
GFR, Estimated: >60 mL/min (ref >60)
Glucose, Bld: 89 mg/dL (ref 70–99)
Potassium: 3.7 mmol/L (ref 3.5–5.1)
Sodium: 137 mmol/L (ref 135–145)

## 2023-10-22 ENCOUNTER — Other Ambulatory Visit: Payer: Self-pay

## 2023-10-22 ENCOUNTER — Ambulatory Visit (HOSPITAL_COMMUNITY): Admitting: Anesthesiology

## 2023-10-22 ENCOUNTER — Encounter (HOSPITAL_COMMUNITY)
Admission: RE | Disposition: A | Discharge status: Home / Self Care | Payer: Self-pay | Source: Home / Self Care | Attending: Internal Medicine

## 2023-10-22 ENCOUNTER — Encounter (HOSPITAL_COMMUNITY): Payer: Self-pay | Admitting: Internal Medicine

## 2023-10-22 ENCOUNTER — Ambulatory Visit (HOSPITAL_COMMUNITY)
Admission: RE | Admit: 2023-10-22 | Discharge: 2023-10-22 | Disposition: A | Discharge status: Home / Self Care | Payer: Medicaid Other | Attending: Internal Medicine | Admitting: Internal Medicine

## 2023-10-22 ENCOUNTER — Ambulatory Visit (HOSPITAL_BASED_OUTPATIENT_CLINIC_OR_DEPARTMENT_OTHER): Admitting: Anesthesiology

## 2023-10-22 DIAGNOSIS — K449 Diaphragmatic hernia without obstruction or gangrene: Secondary | ICD-10-CM

## 2023-10-22 DIAGNOSIS — R1310 Dysphagia, unspecified: Secondary | ICD-10-CM | POA: Insufficient documentation

## 2023-10-22 DIAGNOSIS — I10 Essential (primary) hypertension: Secondary | ICD-10-CM | POA: Insufficient documentation

## 2023-10-22 DIAGNOSIS — K219 Gastro-esophageal reflux disease without esophagitis: Secondary | ICD-10-CM | POA: Diagnosis not present

## 2023-10-22 DIAGNOSIS — Z79899 Other long term (current) drug therapy: Secondary | ICD-10-CM | POA: Insufficient documentation

## 2023-10-22 DIAGNOSIS — E039 Hypothyroidism, unspecified: Secondary | ICD-10-CM | POA: Diagnosis not present

## 2023-10-22 DIAGNOSIS — K5909 Other constipation: Secondary | ICD-10-CM | POA: Diagnosis not present

## 2023-10-22 DIAGNOSIS — E119 Type 2 diabetes mellitus without complications: Secondary | ICD-10-CM | POA: Insufficient documentation

## 2023-10-22 LAB — GLUCOSE, CAPILLARY
Glucose-Capillary: 84 mg/dL (ref 70–99)
Glucose-Capillary: 95 mg/dL (ref 70–99)

## 2023-10-22 SURGERY — ESOPHAGOGASTRODUODENOSCOPY (EGD) WITH PROPOFOL
Anesthesia: General

## 2023-10-22 MED ORDER — LIDOCAINE HCL (PF) 2 % IJ SOLN
INTRAMUSCULAR | Status: DC | PRN
  Administered 2023-10-22: 100 mg via INTRADERMAL

## 2023-10-22 MED ORDER — LACTATED RINGERS IV SOLN: INTRAVENOUS | Status: DC

## 2023-10-22 MED ORDER — PHENYLEPHRINE 80 MCG/ML (10ML) SYRINGE FOR IV PUSH (FOR BLOOD PRESSURE SUPPORT)
PREFILLED_SYRINGE | INTRAVENOUS | Status: DC | PRN
  Administered 2023-10-22: 160 ug via INTRAVENOUS

## 2023-10-22 MED ORDER — LACTATED RINGERS IV SOLN: INTRAVENOUS | Status: DC | PRN

## 2023-10-22 MED ORDER — PROPOFOL 10 MG/ML IV BOLUS
INTRAVENOUS | Status: DC | PRN
Start: 2023-10-22 — End: 2023-10-22
  Administered 2023-10-22: 50 mg via INTRAVENOUS
  Administered 2023-10-22: 100 mg via INTRAVENOUS
  Administered 2023-10-22: 50 mg via INTRAVENOUS

## 2023-10-22 NOTE — Transfer of Care (Signed)
 Immediate Anesthesia Transfer of Care Note  Patient: Dawn Barnes  Procedure(s) Performed: ESOPHAGOGASTRODUODENOSCOPY (EGD) WITH PROPOFOL BALLOON DILATION  Patient Location: Short Stay  Anesthesia Type:General  Level of Consciousness: drowsy  Airway & Oxygen Therapy: Patient Spontanous Breathing  Post-op Assessment: Report given to RN and Post -op Vital signs reviewed and stable  Post vital signs: Reviewed and stable  Last Vitals:  Vitals Value Taken Time  BP    Temp    Pulse    Resp    SpO2      Last Pain:  Vitals:   10/22/23 1043  TempSrc:   PainSc: 0-No pain         Complications: No notable events documented.

## 2023-10-22 NOTE — Anesthesia Preprocedure Evaluation (Signed)
 Anesthesia Evaluation  Patient identified by MRN, date of birth, ID band Patient awake    Reviewed: Allergy & Precautions, H&P , NPO status , Patient's Chart, lab work & pertinent test results, reviewed documented beta blocker date and time   History of Anesthesia Complications (+) PONV and history of anesthetic complications  Airway Mallampati: II  TM Distance: >3 FB Neck ROM: full    Dental no notable dental hx.    Pulmonary neg pulmonary ROS   Pulmonary exam normal breath sounds clear to auscultation       Cardiovascular Exercise Tolerance: Good hypertension, negative cardio ROS  Rhythm:regular Rate:Normal     Neuro/Psych  PSYCHIATRIC DISORDERS Anxiety Depression    negative neurological ROS  negative psych ROS   GI/Hepatic negative GI ROS, Neg liver ROS,GERD  ,,  Endo/Other  diabetesHypothyroidism  Class 3 obesity  Renal/GU negative Renal ROS  negative genitourinary   Musculoskeletal   Abdominal   Peds  Hematology negative hematology ROS (+) Blood dyscrasia, anemia   Anesthesia Other Findings   Reproductive/Obstetrics negative OB ROS                             Anesthesia Physical Anesthesia Plan  ASA: 3  Anesthesia Plan: General   Post-op Pain Management:    Induction:   PONV Risk Score and Plan: Propofol infusion  Airway Management Planned:   Additional Equipment:   Intra-op Plan:   Post-operative Plan:   Informed Consent: I have reviewed the patients History and Physical, chart, labs and discussed the procedure including the risks, benefits and alternatives for the proposed anesthesia with the patient or authorized representative who has indicated his/her understanding and acceptance.     Dental Advisory Given  Plan Discussed with: CRNA  Anesthesia Plan Comments:        Anesthesia Quick Evaluation

## 2023-10-22 NOTE — Op Note (Signed)
 [facility] Patient Name: Dawn Barnes Procedure Date: 10/22/2023 10:28 AM MRN: [id number] Date of Birth: 11/22/1965 Attending MD: Hennie Duos. Marletta Lor , [place], [id number] CSN: [id number] Age: 58 Admit Type: Outpatient Procedure:                Upper GI endoscopy Indications:              Dysphagia Providers:                Hennie Duos. Marletta Lor, DO, Crystal Page, Dyann Ruddle Referring MD:              Medicines:                See the Anesthesia note for documentation of the                            administered medications Complications:            No immediate complications. Estimated Blood Loss:     Estimated blood loss: none. Procedure:                Pre-Anesthesia Assessment:                           - The anesthesia plan was to use monitored                            anesthesia care (MAC).                           After obtaining informed consent, the endoscope was                            passed under direct vision. Throughout the                            procedure, the patient's blood pressure, pulse, and                            oxygen saturations were monitored continuously. The                            GIF-H190 ([id number]) scope was introduced through the                            mouth, and advanced to the second part of duodenum.                            The upper GI endoscopy was accomplished without                            difficulty. The patient tolerated the procedure                            well. Findings:      A small hiatal hernia was present.      No endoscopic abnormality was evident in the esophagus to explain the       patient's  complaint of dysphagia. Preparations were made for empiric       dilation. A TTS dilator was passed through the scope. Dilation with an       18-19-20 mm balloon dilator was performed to 20 mm. Dilation was       performed with a mild resistance at 20 mm. Estimated blood loss was none.      The entire examined  stomach was normal.      The duodenal bulb, first portion of the duodenum and second portion of       the duodenum were normal. Impression:               - Small hiatal hernia.                           - Normal stomach.                           - Normal duodenal bulb, first portion of the                            duodenum and second portion of the duodenum.                           - No specimens collected. Moderate Sedation:      Per Anesthesia Care Recommendation:           - Patient has a contact number available for                            emergencies. The signs and symptoms of potential                            delayed complications were discussed with the                            patient. Return to normal activities tomorrow.                            Written discharge instructions were provided to the                            patient.                           - Resume previous diet.                           - Continue present medications.                           - Repeat upper endoscopy PRN for retreatment.                           - Return to GI clinic in 3 months.                           - Use a proton pump inhibitor PO daily. Procedure  Code(s):        --- Professional ---                           [phone], Esophagogastroduodenoscopy, flexible,                            transoral; diagnostic, including collection of                            specimen(s) by brushing or washing, when performed                            (separate procedure) Diagnosis Code(s):        --- Professional ---                           K44.9, Diaphragmatic hernia without obstruction or                            gangrene                           R13.10, Dysphagia, unspecified CPT copyright 2022 American Medical Association. All rights reserved. The codes documented in this report are preliminary and upon coder review may  be revised to meet current compliance  requirements. Hennie Duos. Marletta Lor, DO Hennie Duos. Marletta Lor, DO 10/22/2023 10:54:47 AM This report has been signed electronically. Number of Addenda: 0

## 2023-10-22 NOTE — Interval H&P Note (Signed)
 History and Physical Interval Note:  10/22/2023 9:48 AM  Dawn Barnes  has presented today for surgery, with the diagnosis of DYSPHAGIA.  The various methods of treatment have been discussed with the patient and family. After consideration of risks, benefits and other options for treatment, the patient has consented to  Procedure(s) with comments: ESOPHAGOGASTRODUODENOSCOPY (EGD) WITH PROPOFOL (N/A) - 11:30 AM, ASA 3 BALLOON DILATION (N/A) as a surgical intervention.  The patient's history has been reviewed, patient examined, no change in status, stable for surgery.  I have reviewed the patient's chart and labs.  Questions were answered to the patient's satisfaction.     Lanelle Bal

## 2023-10-22 NOTE — Discharge Instructions (Addendum)
 EGD Discharge instructions Please read the instructions outlined below and refer to this sheet in the next few weeks. These discharge instructions provide you with general information on caring for yourself after you leave the hospital. Your doctor may also give you specific instructions. While your treatment has been planned according to the most current medical practices available, unavoidable complications occasionally occur. If you have any problems or questions after discharge, please call your doctor. ACTIVITY You may resume your regular activity but move at a slower pace for the next 24 hours.  Take frequent rest periods for the next 24 hours.  Walking will help expel (get rid of) the air and reduce the bloated feeling in your abdomen.  No driving for 24 hours (because of the anesthesia (medicine) used during the test).  You may shower.  Do not sign any important legal documents or operate any machinery for 24 hours (because of the anesthesia used during the test).  NUTRITION Drink plenty of fluids.  You may resume your normal diet.  Begin with a light meal and progress to your normal diet.  Avoid alcoholic beverages for 24 hours or as instructed by your caregiver.  MEDICATIONS You may resume your normal medications unless your caregiver tells you otherwise.  WHAT YOU CAN EXPECT TODAY You may experience abdominal discomfort such as a feeling of fullness or "gas" pains.  FOLLOW-UP Your doctor will discuss the results of your test with you.  SEEK IMMEDIATE MEDICAL ATTENTION IF ANY OF THE FOLLOWING OCCUR: Excessive nausea (feeling sick to your stomach) and/or vomiting.  Severe abdominal pain and distention (swelling).  Trouble swallowing.  Temperature over 101 F (37.8 C).  Rectal bleeding or vomiting of blood.   Your upper endoscopy revealed a small hiatal hernia.  I did stretch your esophagus, hopefully this helps with your swallowing.  Stomach and small bowel appeared  normal.  Continue omeprazole daily.  Follow-up in GI office in 2 to 3 months.   I hope you have a great rest of your week!  Hennie Duos. Marletta Lor, D.O. Gastroenterology and Hepatology [facility]

## 2023-10-22 NOTE — Anesthesia Procedure Notes (Signed)
 Date/Time: 10/22/2023 10:43 AM  Performed by: Julian Reil, CRNAPre-anesthesia Checklist: Patient identified, Emergency Drugs available, Suction available and Patient being monitored Patient Re-evaluated:Patient Re-evaluated prior to induction Oxygen Delivery Method: Nasal cannula Induction Type: IV induction Placement Confirmation: positive ETCO2 Comments: Optiflow High Flow [place] O2 used.

## 2023-10-23 ENCOUNTER — Encounter (HOSPITAL_COMMUNITY): Payer: Self-pay | Admitting: Internal Medicine

## 2023-10-24 ENCOUNTER — Other Ambulatory Visit: Payer: Self-pay | Admitting: *Deleted

## 2023-10-24 DIAGNOSIS — R1010 Upper abdominal pain, unspecified: Secondary | ICD-10-CM

## 2023-10-25 ENCOUNTER — Other Ambulatory Visit (HOSPITAL_COMMUNITY): Payer: Self-pay | Admitting: Internal Medicine

## 2023-10-25 ENCOUNTER — Ambulatory Visit (HOSPITAL_COMMUNITY)
Admission: RE | Admit: 2023-10-25 | Discharge: 2023-10-25 | Disposition: A | Discharge status: Home / Self Care | Source: Ambulatory Visit | Attending: Internal Medicine | Admitting: Internal Medicine

## 2023-10-25 DIAGNOSIS — R928 Other abnormal and inconclusive findings on diagnostic imaging of breast: Secondary | ICD-10-CM

## 2023-10-26 NOTE — Anesthesia Postprocedure Evaluation (Signed)
 Anesthesia Post Note  Patient: Dawn Barnes  Procedure(s) Performed: ESOPHAGOGASTRODUODENOSCOPY (EGD) WITH PROPOFOL BALLOON DILATION  Patient location during evaluation: Phase II Anesthesia Type: General Level of consciousness: awake Pain management: pain level controlled Vital Signs Assessment: post-procedure vital signs reviewed and stable Respiratory status: spontaneous breathing and respiratory function stable Cardiovascular status: blood pressure returned to baseline and stable Postop Assessment: no headache and no apparent nausea or vomiting Anesthetic complications: no Comments: Late entry   No notable events documented.   Last Vitals:  Vitals:   10/22/23 0942 10/22/23 1056  BP: 131/76 (!) 79/46  Pulse: 70 (!) 59  Resp: 20 14  Temp: 36.9 C 36.4 C  SpO2: 96% 94%    Last Pain:  Vitals:   10/23/23 1457  TempSrc:   PainSc: 0-No pain                 Windell Norfolk

## 2023-10-30 ENCOUNTER — Other Ambulatory Visit (HOSPITAL_COMMUNITY): Payer: Self-pay | Admitting: Internal Medicine

## 2023-10-30 DIAGNOSIS — R928 Other abnormal and inconclusive findings on diagnostic imaging of breast: Secondary | ICD-10-CM

## 2023-11-01 ENCOUNTER — Encounter (HOSPITAL_COMMUNITY): Payer: Self-pay

## 2023-11-01 ENCOUNTER — Ambulatory Visit (HOSPITAL_COMMUNITY)
Admission: RE | Admit: 2023-11-01 | Discharge: 2023-11-01 | Disposition: A | Discharge status: Home / Self Care | Source: Ambulatory Visit | Attending: Internal Medicine | Admitting: Internal Medicine

## 2023-11-01 ENCOUNTER — Other Ambulatory Visit (HOSPITAL_COMMUNITY): Payer: Self-pay | Admitting: Internal Medicine

## 2023-11-01 DIAGNOSIS — N6001 Solitary cyst of right breast: Secondary | ICD-10-CM | POA: Diagnosis not present

## 2023-11-01 DIAGNOSIS — R928 Other abnormal and inconclusive findings on diagnostic imaging of breast: Secondary | ICD-10-CM | POA: Diagnosis present

## 2023-11-01 MED ORDER — LIDOCAINE HCL (PF) 2 % IJ SOLN
10.0000 mL | Freq: Once | INTRAMUSCULAR | Status: AC
  Administered 2023-11-01: 10 mL

## 2023-11-01 MED ORDER — LIDOCAINE-EPINEPHRINE (PF) 1 %-1:200000 IJ SOLN
10.0000 mL | Freq: Once | INTRAMUSCULAR | Status: AC
  Administered 2023-11-01: 10 mL via INTRADERMAL

## 2023-11-06 ENCOUNTER — Ambulatory Visit (INDEPENDENT_AMBULATORY_CARE_PROVIDER_SITE_OTHER): Admitting: Surgery

## 2023-11-06 ENCOUNTER — Encounter: Payer: Self-pay | Admitting: Surgery

## 2023-11-06 VITALS — BP 117/76 | HR 75 | Temp 98.2°F | Resp 16 | Ht 64.0 in | Wt 210.0 lb

## 2023-11-06 DIAGNOSIS — K8020 Calculus of gallbladder without cholecystitis without obstruction: Secondary | ICD-10-CM | POA: Diagnosis not present

## 2023-11-06 NOTE — Progress Notes (Unsigned)
 [facility] History and Physical  Reason for Referral:*** Referring Physician: ***  Chief Complaint   New Patient (Initial Visit)     MOSELLA Barnes is a 58 y.o. female.  HPI: ***.  The *** started *** and has had a duration of ***.  It is associated with ***.  The *** is improved with ***, and is made worse with ***.    Quality*** Context***  Past Medical History:  Diagnosis Date  . Anemia   . Anxiety   . Constipation 04/30/2013  . Depression   . Diabetes mellitus without complication (HCC)    borderline  . Fibroids, intramural 01/21/2016  . GERD (gastroesophageal reflux disease)   . Heartburn   . Hot flashes 01/11/2016  . Hypertension   . Hypothyroidism   . Mixed incontinence   . Pancreatitis   . PONV (postoperative nausea and vomiting)   . PTSD (post-traumatic stress disorder)   . Thyroid disease   . Trauma   . Urge urinary incontinence 05/10/2015  . Vaginal bleeding 01/11/2016    Past Surgical History:  Procedure Laterality Date  . BALLOON DILATION N/A 10/22/2023   Procedure: BALLOON DILATION;  Surgeon: Lanelle Bal, DO;  [facility]: [facility];  Service: Endoscopy;  Laterality: N/A;  . BIOPSY  12/07/2015   Procedure: BIOPSY;  Surgeon: West Bali, MD;  [facility]: [facility];  Service: Endoscopy;;  right and left colon biopsies, gastric bx's   . COLONOSCOPY WITH PROPOFOL N/A 12/07/2015   Dr. Darrick Penna: mild proctitis, hyperplastic polyp   . ENDOMETRIAL ABLATION    . ESOPHAGOGASTRODUODENOSCOPY (EGD) WITH PROPOFOL N/A 12/07/2015   Dr. 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   Marland Kitchen ESOPHAGOGASTRODUODENOSCOPY (EGD) WITH PROPOFOL N/A 10/22/2023   Procedure: ESOPHAGOGASTRODUODENOSCOPY (EGD) WITH PROPOFOL;  Surgeon: Lanelle Bal, DO;  [facility]: [facility];  Service: Endoscopy;  Laterality: N/A;  11:30 AM, ASA 3  . INCONTINENCE SURGERY    . KNEE ARTHROSCOPY WITH MEDIAL MENISECTOMY Left  11/17/2021   Procedure: KNEE ARTHROSCOPY WITH MEDIAL MENISECTOMY;  Surgeon: Oliver Barre, MD;  Location: [facility];  Service: Orthopedics;  Laterality: Left;  . SAVORY DILATION N/A 12/07/2015   Procedure: SAVORY DILATION;  Surgeon: West Bali, MD;  [facility];  Service: Endoscopy;  Laterality: N/A;  . TUBAL LIGATION      Family History  Problem Relation Age of Onset  . Diabetes Father   . Heart disease Father   . Hypertension Sister   . Heart disease Sister   . Stroke Paternal Aunt   . Dementia Paternal Aunt   . Hypertension Paternal Aunt   . Heart disease Paternal Aunt   . Seizures Paternal Aunt   . Breast cancer Paternal Aunt   . Stroke Paternal Grandmother   . Heart disease Paternal Grandmother   . Depression Daughter   . Depression Daughter   . Depression Son   . ADD / ADHD Son   . Colon cancer Neg Hx   . Colon polyps Neg Hx     Social History   Tobacco Use  . Smoking status: Never  . Smokeless tobacco: Never  Vaping Use  . Vaping status: Never Used  Substance Use Topics  . Alcohol use: No    Alcohol/week: 0.0 standard drinks of alcohol  . Drug use: No    Medications: {medication reviewed/display:3041432} Allergies as of 11/06/2023       Reactions   Ibuprofen Other (See  Comments)   Nsaids Other (See Comments)   Pt states MD told her not to take due to stomach irritation   Tuberculin Tests Dermatitis        Medication List        Accurate as of November 06, 2023  9:55 AM. If you have any questions, ask your nurse or doctor.          cetirizine 10 MG tablet Commonly known as: ZYRTEC Take 10 mg by mouth daily.   cyanocobalamin 1000 MCG tablet Commonly known as: VITAMIN B12 Take 1,000 mcg by mouth daily.   escitalopram 20 MG tablet Commonly known as: LEXAPRO Take 20 mg by mouth daily.   hydrochlorothiazide 12.5 MG tablet Commonly known as: HYDRODIURIL Take 12.5 mg by mouth daily.   levothyroxine 75 MCG tablet Commonly known as:  SYNTHROID Take 1 tablet (75 mcg total) by mouth daily before breakfast.   lubiprostone 24 MCG capsule Commonly known as: AMITIZA TAKE ONE CAPSULE BY MOUTH TWICE DAILY WITH MEALS   omeprazole 20 MG capsule Commonly known as: PRILOSEC Take 40 mg by mouth daily.   polyethylene glycol 17 g packet Commonly known as: MIRALAX / GLYCOLAX Take 17 g by mouth daily.   Toviaz 8 MG Tb24 tablet Generic drug: fesoterodine Take 8 mg by mouth daily.   TYLENOL PO Take by mouth.   VITAMIN D-3 PO Take 5,000 Units by mouth daily.         ROS:  {Review of Systems:30496}  Blood pressure 117/76, pulse 75, temperature 98.2 F (36.8 C), temperature source Oral, resp. rate 16, height 5\' 4"  (1.626 m), weight 210 lb (95.3 kg), SpO2 94%. Physical Exam  Results: No results found for this or any previous visit (from the past 48 hours).  No results found.   Assessment & Plan:  Dawn Barnes is a 58 y.o. female with *** -*** -*** -Follow up ***  All questions were answered to the satisfaction of the patient and family***.  The risk and benefits of *** were discussed including but not limited to ***.  After careful consideration, Dawn Barnes has decided to ***.    Bayley Yarborough A Lourdes Kucharski 11/06/2023, 9:55 AM   Note: Portions of this report may have been transcribed using voice recognition software. Every effort has been made to ensure accuracy; however, inadvertent computerized transcription errors may still be present.   Theophilus Kinds, DO [facility] [address] R[address] [place], [place] [address] [phone] (office)

## 2023-11-08 NOTE — H&P (Signed)
 [facility] History and Physical  Reason for Referral: Cholelithiasis Referring Physician: Lewie Loron, NP  Chief Complaint   New Patient (Initial Visit)     Dawn Barnes is a 58 y.o. female.  HPI: Patient presents for evaluation of cholelithiasis.  She has pain across her lower abdomen after eating certain foods such as mac & cheese.  She denies any nausea or vomiting.  She will occasionally have some epigastric pain with eating.  She was evaluated by GI, who recommended abdominal ultrasound to evaluate for gallstones and a repeat EGD as she had to have EGD with dilation in the past.  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  She underwent this abdominal ultrasound which confirmed that she had cholelithiasis.  Her past medical history is significant for hypertension, hypothyroidism, diabetes, GERD, and anxiety/depression.  She denies use of blood thinning medications.  She has a surgical history significant for tubal ligation and some type of bladder tacking versus incontinence surgery.  She denies use of tobacco products, alcohol, and illicit drugs.  Past Medical History:  Diagnosis Date   Anemia    Anxiety    Constipation 04/30/2013   Depression    Diabetes mellitus without complication (HCC)    borderline   Fibroids, intramural 01/21/2016   GERD (gastroesophageal reflux disease)    Heartburn    Hot flashes 01/11/2016   Hypertension    Hypothyroidism    Mixed incontinence    Pancreatitis    PONV (postoperative nausea and vomiting)    PTSD (post-traumatic stress disorder)    Thyroid disease    Trauma    Urge urinary incontinence 05/10/2015   Vaginal bleeding 01/11/2016    Past Surgical History:  Procedure Laterality Date   BALLOON DILATION N/A 10/22/2023   Procedure: BALLOON DILATION;  Surgeon: Lanelle Bal, DO;  [facility];  Service: Endoscopy;   Laterality: N/A;   BIOPSY  12/07/2015   Procedure: BIOPSY;  Surgeon: West Bali, MD;  [facility];  Service: Endoscopy;;  right and left colon biopsies, gastric bx's    COLONOSCOPY WITH PROPOFOL N/A 12/07/2015   Dr. Darrick Penna: mild proctitis, hyperplastic polyp    ENDOMETRIAL ABLATION     ESOPHAGOGASTRODUODENOSCOPY (EGD) WITH PROPOFOL N/A 12/07/2015   Dr. 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    ESOPHAGOGASTRODUODENOSCOPY (EGD) WITH PROPOFOL N/A 10/22/2023   Procedure: ESOPHAGOGASTRODUODENOSCOPY (EGD) WITH PROPOFOL;  Surgeon: Lanelle Bal, DO;  [facility];  Service: Endoscopy;  Laterality: N/A;  11:30 AM, ASA 3   INCONTINENCE SURGERY     KNEE ARTHROSCOPY WITH MEDIAL MENISECTOMY Left 11/17/2021   Procedure: KNEE ARTHROSCOPY WITH MEDIAL MENISECTOMY;  Surgeon: Oliver Barre, MD;  Location: [facility];  Service: Orthopedics;  Laterality: Left;   SAVORY DILATION N/A 12/07/2015   Procedure: SAVORY DILATION;  Surgeon: West Bali, MD;  [facility];  Service: Endoscopy;  Laterality: N/A;   TUBAL LIGATION      Family History  Problem Relation Age of Onset   Diabetes Father    Heart disease Father    Hypertension Sister    Heart disease Sister    Stroke Paternal Aunt    Dementia Paternal Aunt    Hypertension Paternal Aunt    Heart disease Paternal Aunt    Seizures Paternal Aunt    Breast cancer  Paternal Aunt    Stroke Paternal Grandmother    Heart disease Paternal Grandmother    Depression Daughter    Depression Daughter    Depression Son    ADD / ADHD Son    Colon cancer Neg Hx    Colon polyps Neg Hx     Social History   Tobacco Use   Smoking status: Never   Smokeless tobacco: Never  Vaping Use   Vaping status: Never Used  Substance Use Topics   Alcohol use: No    Alcohol/week: 0.0 standard drinks of alcohol   Drug use: No    Medications: I have reviewed the patient's current  medications. Allergies as of 11/06/2023       Reactions   Ibuprofen Other (See Comments)   Nsaids Other (See Comments)   Pt states MD told her not to take due to stomach irritation   Tuberculin Tests Dermatitis        Medication List        Accurate as of November 06, 2023  9:55 AM. If you have any questions, ask your nurse or doctor.          cetirizine 10 MG tablet Commonly known as: ZYRTEC Take 10 mg by mouth daily.   cyanocobalamin 1000 MCG tablet Commonly known as: VITAMIN B12 Take 1,000 mcg by mouth daily.   escitalopram 20 MG tablet Commonly known as: LEXAPRO Take 20 mg by mouth daily.   hydrochlorothiazide 12.5 MG tablet Commonly known as: HYDRODIURIL Take 12.5 mg by mouth daily.   levothyroxine 75 MCG tablet Commonly known as: SYNTHROID Take 1 tablet (75 mcg total) by mouth daily before breakfast.   lubiprostone 24 MCG capsule Commonly known as: AMITIZA TAKE ONE CAPSULE BY MOUTH TWICE DAILY WITH MEALS   omeprazole 20 MG capsule Commonly known as: PRILOSEC Take 40 mg by mouth daily.   polyethylene glycol 17 g packet Commonly known as: MIRALAX / GLYCOLAX Take 17 g by mouth daily.   Toviaz 8 MG Tb24 tablet Generic drug: fesoterodine Take 8 mg by mouth daily.   TYLENOL PO Take by mouth.   VITAMIN D-3 PO Take 5,000 Units by mouth daily.         ROS:  Constitutional: negative for chills, fatigue, and fevers Eyes: negative for visual disturbance and pain Ears, nose, mouth, throat, and face: negative for ear drainage, sore throat, and sinus problems Respiratory: negative for cough, wheezing, and shortness of breath Cardiovascular: negative for chest pain and palpitations Gastrointestinal: negative for abdominal pain, nausea, reflux symptoms, and vomiting Genitourinary:negative for dysuria and frequency Integument/breast: negative for dryness and rash Hematologic/lymphatic: negative for bleeding and lymphadenopathy Musculoskeletal:negative for  back pain and neck pain Neurological: negative for dizziness and tremors Endocrine: negative for temperature intolerance  Blood pressure 117/76, pulse 75, temperature 98.2 F (36.8 C), temperature source Oral, resp. rate 16, height 5\' 4"  (1.626 m), weight 210 lb (95.3 kg), SpO2 94%. Physical Exam Vitals reviewed.  Constitutional:      Appearance: Normal appearance.  HENT:     Head: Normocephalic and atraumatic.  Eyes:     Extraocular Movements: Extraocular movements intact.     Pupils: Pupils are equal, round, and reactive to light.  Cardiovascular:     Rate and Rhythm: Normal rate and regular rhythm.  Pulmonary:     Effort: Pulmonary effort is normal.     Breath sounds: Normal breath sounds.  Abdominal:     Comments: Abdomen soft, nondistended, no percussion tenderness, nontender palpation;  no rigidity, guarding, rebound tenderness; negative Murphy sign  Musculoskeletal:        General: Normal range of motion.     Cervical back: Normal range of motion.  Skin:    General: Skin is warm and dry.  Neurological:     General: No focal deficit present.     Mental Status: She is alert and oriented to person, place, and time.  Psychiatric:        Mood and Affect: Mood normal.        Behavior: Behavior normal.     Results: Abdominal ultrasound (10/04/2023): IMPRESSION: 1. Cholelithiasis without sonographic evidence of acute cholecystitis. 2. Mildly increased hepatic parenchymal echogenicity as can be seen in hepatic steatosis.   Assessment & Plan:  PAYSLEE BATESON is a 58 y.o. female is for evaluation of cholelithiasis.  -I discussed the pathophysiology of gallbladder disease/cholelithiasis and we discussed the need for removal of the gallbladder.  We also discussed that her symptoms are not 100% consistent with gallbladder disease, so there is a chance that some of the symptoms will persist after her gallbladder has been removed -I counseled the patient about the indication, risks  and benefits of robotic assisted laparoscopic cholecystectomy.  She understands there is a very small chance for bleeding, infection, injury to normal structures (including common bile duct), conversion to open surgery, persistent symptoms, evolution of postcholecystectomy diarrhea, need for secondary interventions, anesthesia reaction, cardiopulmonary issues and other risks not specifically detailed here. I described the expected recovery, the plan for follow-up and the restrictions during the recovery phase.  All questions were answered. -Patient tentatively scheduled for surgery on 4/28 -Information provided to the patient regarding cholelithiasis, cholecystitis, cholecystectomy, and low-fat diet -Advised that the patient should present to the ED if they begin to have severe right upper quadrant abdominal pain, nausea, vomiting, fever, and chills  All questions were answered to the satisfaction of the patient.  Note: Portions of this report may have been transcribed using voice recognition software. Every effort has been made to ensure accuracy; however, inadvertent computerized transcription errors may still be present.   Theophilus Kinds, DO [facility] [address] [place] [place], [place] [address] [phone] (office)

## 2023-11-20 NOTE — Patient Instructions (Signed)
 Dawn Barnes  11/20/2023     @PREFPERIOPPHARMACY @   Your procedure is scheduled on  11/26/2023.   Report to [facility] at  0600 A.M.   Call this number if you have problems the morning of surgery:  [phone]  If you experience any cold or flu symptoms such as cough, fever, chills, shortness of breath, etc. between now and your scheduled surgery, please notify us  at the above number.   Remember:         DO NOT take any medications for diabetes the morning of your procedure.   Do not eat after midnight.   You may drink clear liquids until  0330 am on 11/26/2023.    Clear liquids allowed are:                    Water, Juice (No red color; non-citric and without pulp; diabetics please choose diet or no sugar options), Carbonated beverages (diabetics please choose diet or no sugar options), Clear Tea (No creamer, milk, or cream, including half & half and powdered creamer), Black Coffee Only (No creamer, milk or cream, including half & half and powdered creamer), and Clear Sports drink (No red color; diabetics please choose diet or no sugar options)    Take these medicines the morning of surgery with A SIP OF WATER                escitalopram, levothyroxine , omeprazole, toviaz.    Do not wear jewelry, make-up or nail polish, including gel polish,  artificial nails, or any other type of covering on natural nails (fingers and  toes).  Do not wear lotions, powders, or perfumes, or deodorant.  Do not shave 48 hours prior to surgery.  Men may shave face and neck.  Do not bring valuables to the hospital.  [facility] is not responsible for any belongings or valuables.  Contacts, dentures or bridgework may not be worn into surgery.  Leave your suitcase in the car.  After surgery it may be brought to your room.  For patients admitted to the hospital, discharge time will be determined by your treatment team.  Patients discharged the day of surgery will not be allowed to  drive home and must have someone with them for 24 hours.    Special instructions:   DO NOT smoke tobacco or vape for 24 hours before your procedure.  Please read over the following fact sheets that you were given. Coughing and Deep Breathing, Surgical Site Infection Prevention, Anesthesia Post-op Instructions, and Care and Recovery After Surgery      Minimally Invasive Cholecystectomy, Care After The following information offers guidance on how to care for yourself after your procedure. Your health care provider may also give you more specific instructions. If you have problems or questions, contact your health care provider. What can I expect after the procedure? After the procedure, it is common to have: Pain at your incision sites. You will be given medicines to control this pain. Mild nausea or vomiting. Bloating and possible shoulder pain from the gas that was used during the procedure. Follow these instructions at home: Medicines Take over-the-counter and prescription medicines only as told by your health care provider. If you were prescribed an antibiotic medicine, take it as told by your health care provider. Do not stop using the antibiotic even if you start to feel better. Ask your health care provider if the medicine prescribed to you:  Requires you to avoid driving or using machinery. Can cause constipation. You may need to take these actions to prevent or treat constipation: Drink enough fluid to keep your urine pale yellow. Take over-the-counter or prescription medicines. Eat foods that are high in fiber, such as beans, whole grains, and fresh fruits and vegetables. Limit foods that are high in fat and processed sugars, such as fried or sweet foods. Incision care  Follow instructions from your health care provider about how to take care of your incisions. Make sure you: Wash your hands with soap and water for at least 20 seconds before and after you change your bandage  (dressing). If soap and water are not available, use hand sanitizer. Change your dressing as told by your health care provider. Leave stitches (sutures), skin glue, or adhesive strips in place. These skin closures may need to be in place for 2 weeks or longer. If adhesive strip edges start to loosen and curl up, you may trim the loose edges. Do not remove adhesive strips completely unless your health care provider tells you to do that. Do not take baths, swim, or use a hot tub until your health care provider approves. Ask your health care provider if you may take showers. You may only be allowed to take sponge baths. Check your incision area every day for signs of infection. Check for: More redness, swelling, or pain. Fluid or blood. Warmth. Pus or a bad smell. Activity Rest as told by your health care provider. Do not do activities that require a lot of effort. Avoid sitting for a long time without moving. Get up to take short walks every 1-2 hours. This is important to improve blood flow and breathing. Ask for help if you feel weak or unsteady. Do not lift anything that is heavier than 10 lb (4.5 kg), or the limit that you are told, until your health care provider says that it is safe. Do not play contact sports until your health care provider approves. Do not return to work or school until your health care provider approves. Return to your normal activities as told by your health care provider. Ask your health care provider what activities are safe for you. General instructions If you were given a sedative during the procedure, it can affect you for several hours. Do not drive or operate machinery until your health care provider says that it is safe. Keep all follow-up visits. This is important. Contact a health care provider if: You develop a rash. You have more redness, swelling, or pain around your incisions. You have fluid or blood coming from your incisions. Your incisions feel warm to  the touch. You have pus or a bad smell coming from your incisions. You have a fever. One or more of your incisions breaks open. Get help right away if: You have trouble breathing. You have chest pain. You have more pain in your shoulders. You faint or feel dizzy when you stand. You have severe pain in your abdomen. You have nausea or vomiting that lasts for more than one day. You have leg pain that is new or unusual, or if it is localized to one specific spot. These symptoms may represent a serious problem that is an emergency. Do not wait to see if the symptoms will go away. Get medical help right away. Call your local emergency services (911 in the [place] Do not drive yourself to the hospital. Summary After your procedure, it is common to have pain at the  incision sites. You may also have nausea or bloating. Follow your health care provider's instructions about medicine, activity restrictions, and caring for your incision areas. Do not do activities that require a lot of effort. Contact a health care provider if you have a fever or other signs of infection, such as more redness, swelling, or pain around the incisions. Get help right away if you have chest pain, increasing pain in the shoulders, or trouble breathing. This information is not intended to replace advice given to you by your health care provider. Make sure you discuss any questions you have with your health care provider. Document Revised: 01/17/2021 Document Reviewed: 01/18/2021 Elsevier Patient Education  2024 [employer].General Anesthesia, Adult, Care After The following information offers guidance on how to care for yourself after your procedure. Your health care provider may also give you more specific instructions. If you have problems or questions, contact your health care provider. What can I expect after the procedure? After the procedure, it is common for people to: Have pain or discomfort at the IV site. Have  nausea or vomiting. Have a sore throat or hoarseness. Have trouble concentrating. Feel cold or chills. Feel weak, sleepy, or tired (fatigue). Have soreness and body aches. These can affect parts of the body that were not involved in surgery. Follow these instructions at home: For the time period you were told by your health care provider:  Rest. Do not participate in activities where you could fall or become injured. Do not drive or use machinery. Do not drink alcohol. Do not take sleeping pills or medicines that cause drowsiness. Do not make important decisions or sign legal documents. Do not take care of children on your own. General instructions Drink enough fluid to keep your urine pale yellow. If you have sleep apnea, surgery and certain medicines can increase your risk for breathing problems. Follow instructions from your health care provider about wearing your sleep device: Anytime you are sleeping, including during daytime naps. While taking prescription pain medicines, sleeping medicines, or medicines that make you drowsy. Return to your normal activities as told by your health care provider. Ask your health care provider what activities are safe for you. Take over-the-counter and prescription medicines only as told by your health care provider. Do not use any products that contain nicotine or tobacco. These products include cigarettes, chewing tobacco, and vaping devices, such as e-cigarettes. These can delay incision healing after surgery. If you need help quitting, ask your health care provider. Contact a health care provider if: You have nausea or vomiting that does not get better with medicine. You vomit every time you eat or drink. You have pain that does not get better with medicine. You cannot urinate or have bloody urine. You develop a skin rash. You have a fever. Get help right away if: You have trouble breathing. You have chest pain. You vomit blood. These  symptoms may be an emergency. Get help right away. Call 911. Do not wait to see if the symptoms will go away. Do not drive yourself to the hospital. Summary After the procedure, it is common to have a sore throat, hoarseness, nausea, vomiting, or to feel weak, sleepy, or fatigue. For the time period you were told by your health care provider, do not drive or use machinery. Get help right away if you have difficulty breathing, have chest pain, or vomit blood. These symptoms may be an emergency. This information is not intended to replace advice given to you  by your health care provider. Make sure you discuss any questions you have with your health care provider. Document Revised: 10/14/2021 Document Reviewed: 10/14/2021 Elsevier Patient Education  2024 Elsevier Inc.How to Use Chlorhexidine  at Home in the Shower Chlorhexidine  gluconate (CHG) is a germ-killing (antiseptic) wash that's used to clean the skin. It can get rid of the germs that normally live on the skin and can keep them away for about 24 hours. If you're having surgery, you may be told to shower with CHG at home the night before surgery. This can help lower your risk for infection. To use CHG wash in the shower, follow the steps below. Supplies needed: CHG body wash. Clean washcloth. Clean towel. How to use CHG in the shower Follow these steps unless you're told to use CHG in a different way: Start the shower. Use your normal soap and shampoo to wash your face and hair. Turn off the shower or move out of the shower stream. Pour CHG onto a clean washcloth. Do not use any type of brush or rough sponge. Start at your neck, washing your body down to your toes. Make sure you: Wash the part of your body where the surgery will be done for at least 1 minute. Do not scrub. Do not use CHG on your head or face unless your health care provider tells you to. If it gets into your ears or eyes, rinse them well with water . Do not wash your  genitals with CHG. Wash your back and under your arms. Make sure to wash skin folds. Let the CHG sit on your skin for 1-2 minutes or as long as told. Rinse your entire body in the shower, including all body creases and folds. Turn off the shower. Dry off with a clean towel. Do not put anything on your skin afterward, such as powder, lotion, or perfume. Put on clean clothes or pajamas. If it's the night before surgery, sleep in clean sheets. General tips Use CHG only as told, and follow the instructions on the label. Use the full amount of CHG as told. This is often one bottle. Do not smoke and stay away from flames after using CHG. Your skin may feel sticky after using CHG. This is normal. The sticky feeling will go away as the CHG dries. Do not use CHG: If you have a chlorhexidine  allergy or have reacted to chlorhexidine  in the past. On open wounds or areas of skin that have broken skin, cuts, or scrapes. On babies younger than 73 months of age. Contact a health care provider if: You have questions about using CHG. Your skin gets irritated or itchy. You have a rash after using CHG. You swallow any CHG. Call your local poison control center (737) 827-2899 in the [place] Your eyes itch badly, or they become very red or swollen. Your hearing changes. You have trouble seeing. If you can't reach your provider, go to an urgent care or emergency room. Do not drive yourself. Get help right away if: You have swelling or tingling in your mouth or throat. You make high-pitched whistling sounds when you breathe, most often when you breathe out (wheeze). You have trouble breathing. These symptoms may be an emergency. Call 911 right away. Do not wait to see if the symptoms will go away. Do not drive yourself to the hospital. This information is not intended to replace advice given to you by your health care provider. Make sure you discuss any questions you have with your health care  provider. Document Revised: 01/30/2023 Document Reviewed: 01/26/2022 Elsevier Patient Education  2024 [employer].

## 2023-11-21 ENCOUNTER — Encounter (HOSPITAL_COMMUNITY)
Admission: RE | Admit: 2023-11-21 | Discharge: 2023-11-21 | Disposition: A | Discharge status: Home / Self Care | Source: Ambulatory Visit | Attending: Surgery | Admitting: Surgery

## 2023-11-21 ENCOUNTER — Encounter (HOSPITAL_COMMUNITY): Payer: Self-pay

## 2023-11-21 VITALS — BP 117/76 | HR 75 | Temp 98.2°F | Resp 18 | Ht 64.0 in | Wt 210.0 lb

## 2023-11-21 DIAGNOSIS — Z01812 Encounter for preprocedural laboratory examination: Secondary | ICD-10-CM | POA: Diagnosis present

## 2023-11-21 DIAGNOSIS — R7303 Prediabetes: Secondary | ICD-10-CM | POA: Diagnosis not present

## 2023-11-21 HISTORY — DX: Prediabetes: R73.03

## 2023-11-21 LAB — HEMOGLOBIN A1C
Hgb A1c MFr Bld: 5.3 % (ref 4.8–5.6)
Mean Plasma Glucose: 105.41 mg/dL

## 2023-11-26 ENCOUNTER — Encounter (HOSPITAL_COMMUNITY): Payer: Self-pay | Admitting: Surgery

## 2023-11-26 ENCOUNTER — Ambulatory Visit (HOSPITAL_COMMUNITY)
Admission: RE | Admit: 2023-11-26 | Discharge: 2023-11-26 | Disposition: A | Discharge status: Home / Self Care | Attending: Surgery | Admitting: Surgery

## 2023-11-26 ENCOUNTER — Ambulatory Visit (HOSPITAL_COMMUNITY): Admitting: Anesthesiology

## 2023-11-26 ENCOUNTER — Other Ambulatory Visit: Payer: Self-pay

## 2023-11-26 ENCOUNTER — Ambulatory Visit (HOSPITAL_BASED_OUTPATIENT_CLINIC_OR_DEPARTMENT_OTHER): Admitting: Anesthesiology

## 2023-11-26 ENCOUNTER — Encounter (HOSPITAL_COMMUNITY)
Admission: RE | Disposition: A | Discharge status: Home / Self Care | Payer: Self-pay | Source: Home / Self Care | Attending: Surgery

## 2023-11-26 DIAGNOSIS — E119 Type 2 diabetes mellitus without complications: Secondary | ICD-10-CM | POA: Diagnosis not present

## 2023-11-26 DIAGNOSIS — I10 Essential (primary) hypertension: Secondary | ICD-10-CM | POA: Insufficient documentation

## 2023-11-26 DIAGNOSIS — K449 Diaphragmatic hernia without obstruction or gangrene: Secondary | ICD-10-CM | POA: Insufficient documentation

## 2023-11-26 DIAGNOSIS — K828 Other specified diseases of gallbladder: Secondary | ICD-10-CM | POA: Diagnosis not present

## 2023-11-26 DIAGNOSIS — K760 Fatty (change of) liver, not elsewhere classified: Secondary | ICD-10-CM | POA: Insufficient documentation

## 2023-11-26 DIAGNOSIS — Z833 Family history of diabetes mellitus: Secondary | ICD-10-CM | POA: Diagnosis not present

## 2023-11-26 DIAGNOSIS — F32A Depression, unspecified: Secondary | ICD-10-CM | POA: Insufficient documentation

## 2023-11-26 DIAGNOSIS — K8020 Calculus of gallbladder without cholecystitis without obstruction: Secondary | ICD-10-CM | POA: Diagnosis not present

## 2023-11-26 DIAGNOSIS — E039 Hypothyroidism, unspecified: Secondary | ICD-10-CM

## 2023-11-26 DIAGNOSIS — K219 Gastro-esophageal reflux disease without esophagitis: Secondary | ICD-10-CM | POA: Diagnosis not present

## 2023-11-26 DIAGNOSIS — K8010 Calculus of gallbladder with chronic cholecystitis without obstruction: Secondary | ICD-10-CM | POA: Insufficient documentation

## 2023-11-26 DIAGNOSIS — Z79899 Other long term (current) drug therapy: Secondary | ICD-10-CM | POA: Diagnosis not present

## 2023-11-26 DIAGNOSIS — F418 Other specified anxiety disorders: Secondary | ICD-10-CM

## 2023-11-26 DIAGNOSIS — F419 Anxiety disorder, unspecified: Secondary | ICD-10-CM | POA: Insufficient documentation

## 2023-11-26 LAB — GLUCOSE, CAPILLARY
Glucose-Capillary: 78 mg/dL (ref 70–99)
Glucose-Capillary: 135 mg/dL — ABNORMAL HIGH (ref 70–99)

## 2023-11-26 SURGERY — CHOLECYSTECTOMY, ROBOT-ASSISTED, LAPAROSCOPIC
Anesthesia: General | Site: Abdomen

## 2023-11-26 MED ORDER — ONDANSETRON HCL 4 MG/2ML IJ SOLN
INTRAMUSCULAR | Status: DC | PRN
  Administered 2023-11-26: 4 mg via INTRAVENOUS

## 2023-11-26 MED ORDER — CHLORHEXIDINE GLUCONATE 0.12 % MT SOLN
15.0000 mL | Freq: Once | OROMUCOSAL | Status: AC
  Administered 2023-11-26: 15 mL via OROMUCOSAL

## 2023-11-26 MED ORDER — GLYCOPYRROLATE PF 0.2 MG/ML IJ SOSY
PREFILLED_SYRINGE | INTRAMUSCULAR | Status: DC | PRN
  Administered 2023-11-26: .2 mg via INTRAVENOUS

## 2023-11-26 MED ORDER — LACTATED RINGERS IV SOLN: INTRAVENOUS | Status: DC

## 2023-11-26 MED ORDER — ACETAMINOPHEN 500 MG PO TABS
1000.0000 mg | ORAL_TABLET | Freq: Four times a day (QID) | ORAL | 0 refills | Status: AC
Start: 2023-11-26 — End: 2023-12-03

## 2023-11-26 MED ORDER — OXYCODONE HCL 5 MG PO TABS
5.0000 mg | ORAL_TABLET | Freq: Once | ORAL | Status: AC | PRN
  Administered 2023-11-26: 5 mg via ORAL
  Filled 2023-11-26: qty 1

## 2023-11-26 MED ORDER — SCOPOLAMINE 1 MG/3DAYS TD PT72
MEDICATED_PATCH | TRANSDERMAL | Status: AC
  Administered 2023-11-26: 1.5 mg via TRANSDERMAL
  Filled 2023-11-26: qty 1

## 2023-11-26 MED ORDER — PROPOFOL 10 MG/ML IV BOLUS
INTRAVENOUS | Status: DC | PRN
Start: 2023-11-26 — End: 2023-11-26
  Administered 2023-11-26: 30 mg via INTRAVENOUS
  Administered 2023-11-26: 170 mg via INTRAVENOUS

## 2023-11-26 MED ORDER — OXYCODONE HCL 5 MG/5ML PO SOLN: 5.0000 mg | Freq: Once | ORAL | Status: AC | PRN

## 2023-11-26 MED ORDER — DOCUSATE SODIUM 100 MG PO CAPS: 100.0000 mg | ORAL_CAPSULE | Freq: Two times a day (BID) | ORAL | 2 refills | Status: DC

## 2023-11-26 MED ORDER — SODIUM CHLORIDE 0.9 % IV SOLN: 12.5000 mg | INTRAVENOUS | Status: DC | PRN

## 2023-11-26 MED ORDER — CHLORHEXIDINE GLUCONATE CLOTH 2 % EX PADS: 6.0000 | MEDICATED_PAD | Freq: Once | CUTANEOUS | Status: DC

## 2023-11-26 MED ORDER — ONDANSETRON HCL 4 MG/2ML IJ SOLN
INTRAMUSCULAR | Status: AC
Start: 2023-11-26 — End: ?
  Filled 2023-11-26: qty 2

## 2023-11-26 MED ORDER — FENTANYL CITRATE (PF) 250 MCG/5ML IJ SOLN
INTRAMUSCULAR | Status: DC | PRN
  Administered 2023-11-26 (×4): 50 ug via INTRAVENOUS

## 2023-11-26 MED ORDER — BUPIVACAINE HCL (PF) 0.5 % IJ SOLN
INTRAMUSCULAR | Status: DC | PRN
  Administered 2023-11-26: 30 mL

## 2023-11-26 MED ORDER — LIDOCAINE 2% (20 MG/ML) 5 ML SYRINGE
INTRAMUSCULAR | Status: AC
  Filled 2023-11-26: qty 5

## 2023-11-26 MED ORDER — FENTANYL CITRATE (PF) 250 MCG/5ML IJ SOLN
INTRAMUSCULAR | Status: AC
  Filled 2023-11-26: qty 5

## 2023-11-26 MED ORDER — INDOCYANINE GREEN 25 MG IV SOLR
INTRAVENOUS | Status: AC
  Administered 2023-11-26: 2.5 mg via INTRAVENOUS
  Filled 2023-11-26: qty 10

## 2023-11-26 MED ORDER — SCOPOLAMINE 1 MG/3DAYS TD PT72: 1.0000 | MEDICATED_PATCH | Freq: Once | TRANSDERMAL | Status: DC

## 2023-11-26 MED ORDER — PROPOFOL 10 MG/ML IV BOLUS
INTRAVENOUS | Status: AC
  Filled 2023-11-26: qty 20

## 2023-11-26 MED ORDER — ACETAMINOPHEN 160 MG/5ML PO SOLN
960.0000 mg | Freq: Once | ORAL | Status: AC
  Filled 2023-11-26: qty 30

## 2023-11-26 MED ORDER — DEXAMETHASONE SODIUM PHOSPHATE 10 MG/ML IJ SOLN
INTRAMUSCULAR | Status: DC | PRN
  Administered 2023-11-26: 5 mg via INTRAVENOUS

## 2023-11-26 MED ORDER — DEXAMETHASONE SODIUM PHOSPHATE 10 MG/ML IJ SOLN
INTRAMUSCULAR | Status: AC
  Filled 2023-11-26: qty 1

## 2023-11-26 MED ORDER — ACETAMINOPHEN 500 MG PO TABS
ORAL_TABLET | ORAL | Status: AC
  Filled 2023-11-26: qty 2

## 2023-11-26 MED ORDER — ROCURONIUM BROMIDE 10 MG/ML (PF) SYRINGE
PREFILLED_SYRINGE | INTRAVENOUS | Status: AC
  Filled 2023-11-26: qty 10

## 2023-11-26 MED ORDER — OXYCODONE HCL 5 MG PO TABS: 5.0000 mg | ORAL_TABLET | Freq: Four times a day (QID) | ORAL | 0 refills | Status: DC | PRN

## 2023-11-26 MED ORDER — HYDROMORPHONE HCL 1 MG/ML IJ SOLN
0.2500 mg | INTRAMUSCULAR | Status: DC | PRN
  Administered 2023-11-26: 0.5 mg via INTRAVENOUS
  Filled 2023-11-26: qty 0.5

## 2023-11-26 MED ORDER — SUGAMMADEX SODIUM 200 MG/2ML IV SOLN
INTRAVENOUS | Status: DC | PRN
  Administered 2023-11-26: 200 mg via INTRAVENOUS

## 2023-11-26 MED ORDER — ORAL CARE MOUTH RINSE: 15.0000 mL | Freq: Once | OROMUCOSAL | Status: AC

## 2023-11-26 MED ORDER — STERILE WATER FOR IRRIGATION IR SOLN
Status: DC | PRN
  Administered 2023-11-26: 500 mL

## 2023-11-26 MED ORDER — ONDANSETRON HCL 4 MG PO TABS: 4.0000 mg | ORAL_TABLET | Freq: Every day | ORAL | 1 refills | Status: DC | PRN

## 2023-11-26 MED ORDER — BUPIVACAINE HCL (PF) 0.5 % IJ SOLN
INTRAMUSCULAR | Status: AC
  Filled 2023-11-26: qty 30

## 2023-11-26 MED ORDER — SODIUM CHLORIDE 0.9 % IV SOLN
2.0000 g | INTRAVENOUS | Status: AC
  Administered 2023-11-26: 2 g via INTRAVENOUS
  Filled 2023-11-26: qty 2

## 2023-11-26 MED ORDER — ROCURONIUM BROMIDE 10 MG/ML (PF) SYRINGE
PREFILLED_SYRINGE | INTRAVENOUS | Status: DC | PRN
  Administered 2023-11-26: 60 mg via INTRAVENOUS

## 2023-11-26 MED ORDER — GLYCOPYRROLATE PF 0.2 MG/ML IJ SOSY
PREFILLED_SYRINGE | INTRAMUSCULAR | Status: AC
  Filled 2023-11-26: qty 1

## 2023-11-26 MED ORDER — CHLORHEXIDINE GLUCONATE CLOTH 2 % EX PADS
6.0000 | MEDICATED_PAD | Freq: Once | CUTANEOUS | Status: AC
  Administered 2023-11-26: 6 via TOPICAL

## 2023-11-26 MED ORDER — INDOCYANINE GREEN 25 MG IV SOLR: 2.5000 mg | Freq: Once | INTRAVENOUS | Status: AC

## 2023-11-26 MED ORDER — ACETAMINOPHEN 500 MG PO TABS
1000.0000 mg | ORAL_TABLET | Freq: Once | ORAL | Status: AC
  Administered 2023-11-26: 1000 mg via ORAL

## 2023-11-26 SURGICAL SUPPLY — 35 items
BLADE SURG 15 STRL LF DISP TIS (BLADE) ×1 IMPLANT
CAUTERY HOOK MNPLR 1.6 DVNC XI (INSTRUMENTS) ×1 IMPLANT
CHLORAPREP W/TINT 26 (MISCELLANEOUS) ×1 IMPLANT
CLIP LIGATING HEM O LOK PURPLE (MISCELLANEOUS) ×1 IMPLANT
DEFOGGER SCOPE WARMER CLEARIFY (MISCELLANEOUS) ×1 IMPLANT
DERMABOND ADVANCED .7 DNX12 (GAUZE/BANDAGES/DRESSINGS) ×1 IMPLANT
DRAPE ARM DVNC X/XI (DISPOSABLE) ×4 IMPLANT
DRAPE COLUMN DVNC XI (DISPOSABLE) ×1 IMPLANT
ELECTRODE REM PT RTRN 9FT ADLT (ELECTROSURGICAL) ×1 IMPLANT
FORCEPS BPLR R/ABLATION 8 DVNC (INSTRUMENTS) ×1 IMPLANT
FORCEPS PROGRASP DVNC XI (FORCEP) ×1 IMPLANT
GLOVE BIOGEL PI IND STRL 6.5 (GLOVE) ×2 IMPLANT
GLOVE BIOGEL PI IND STRL 7.0 (GLOVE) ×3 IMPLANT
GLOVE SURG SS PI 6.5 STRL IVOR (GLOVE) ×2 IMPLANT
GOWN STRL REUS W/TWL LRG LVL3 (GOWN DISPOSABLE) ×3 IMPLANT
GRASPER SUT TROCAR 14GX15 (MISCELLANEOUS) ×1 IMPLANT
KIT TURNOVER KIT A (KITS) ×1 IMPLANT
MANIFOLD NEPTUNE II (INSTRUMENTS) ×1 IMPLANT
NDL HYPO 21X1.5 SAFETY (NEEDLE) ×1 IMPLANT
NDL INSUFFLATION 14GA 120MM (NEEDLE) ×1 IMPLANT
NEEDLE HYPO 21X1.5 SAFETY (NEEDLE) ×1 IMPLANT
NEEDLE INSUFFLATION 14GA 120MM (NEEDLE) ×1 IMPLANT
OBTURATOR OPTICALSTD 8 DVNC (TROCAR) ×1 IMPLANT
PACK LAP CHOLE LZT030E (CUSTOM PROCEDURE TRAY) ×1 IMPLANT
PAD ARMBOARD POSITIONER FOAM (MISCELLANEOUS) ×1 IMPLANT
PENCIL HANDSWITCHING (ELECTRODE) ×1 IMPLANT
POSITIONER HEAD 8X9X4 ADT (SOFTGOODS) ×1 IMPLANT
SEAL UNIV 5-12 XI (MISCELLANEOUS) ×4 IMPLANT
SET BASIN LINEN APH (SET/KITS/TRAYS/PACK) ×1 IMPLANT
SET TUBE SMOKE EVAC HIGH FLOW (TUBING) ×1 IMPLANT
SUT MNCRL AB 4-0 PS2 18 (SUTURE) ×2 IMPLANT
SUT VICRYL 0 UR6 27IN ABS (SUTURE) ×1 IMPLANT
SYR 30ML LL (SYRINGE) ×1 IMPLANT
SYSTEM RETRIEVL 5MM INZII UNIV (BASKET) ×1 IMPLANT
WATER STERILE IRR 500ML POUR (IV SOLUTION) ×1 IMPLANT

## 2023-11-26 NOTE — Anesthesia Preprocedure Evaluation (Signed)
 Anesthesia Evaluation  Patient identified by MRN, date of birth, ID band Patient awake    Reviewed: Allergy & Precautions, H&P , NPO status , Patient's Chart, lab work & pertinent test results, reviewed documented beta blocker date and time   History of Anesthesia Complications (+) PONV and history of anesthetic complications  Airway Mallampati: II  TM Distance: >3 FB Neck ROM: full    Dental no notable dental hx. (+) Dental Advisory Given, Teeth Intact   Pulmonary neg pulmonary ROS   Pulmonary exam normal breath sounds clear to auscultation       Cardiovascular Exercise Tolerance: Good hypertension, Normal cardiovascular exam Rhythm:regular Rate:Normal     Neuro/Psych  PSYCHIATRIC DISORDERS Anxiety Depression    PTSDnegative neurological ROS     GI/Hepatic Neg liver ROS,GERD  ,,pancreatitis   Endo/Other  diabetes, Type 2Hypothyroidism    Renal/GU negative Renal ROS  negative genitourinary   Musculoskeletal   Abdominal   Peds  Hematology negative hematology ROS (+)   Anesthesia Other Findings   Reproductive/Obstetrics negative OB ROS                             Anesthesia Physical Anesthesia Plan  ASA: 3  Anesthesia Plan: General   Post-op Pain Management: Dilaudid IV   Induction: Intravenous  PONV Risk Score and Plan: Ondansetron , Dexamethasone , Midazolam  and Scopolamine  patch - Pre-op  Airway Management Planned: Oral ETT  Additional Equipment: None  Intra-op Plan:   Post-operative Plan: Extubation in OR  Informed Consent: I have reviewed the patients History and Physical, chart, labs and discussed the procedure including the risks, benefits and alternatives for the proposed anesthesia with the patient or authorized representative who has indicated his/her understanding and acceptance.     Dental Advisory Given  Plan Discussed with: CRNA  Anesthesia Plan Comments:         Anesthesia Quick Evaluation

## 2023-11-26 NOTE — Progress Notes (Signed)
 Dr.Ewell notified of patient's CBG 78, no symptoms from patient. No new orders given at this time.

## 2023-11-26 NOTE — Interval H&P Note (Signed)
 History and Physical Interval Note:  11/26/2023 7:22 AM  Dawn Barnes  has presented today for surgery, with the diagnosis of CHOLELITHIASIS.  The various methods of treatment have been discussed with the patient and family. After consideration of risks, benefits and other options for treatment, the patient has consented to  Procedure(s): CHOLECYSTECTOMY, ROBOT-ASSISTED, LAPAROSCOPIC (N/A) as a surgical intervention.  The patient's history has been reviewed, patient examined, no change in status, stable for surgery.  I have reviewed the patient's chart and labs.  Questions were answered to the patient's satisfaction.     Tharun Cappella A Zohar Maroney

## 2023-11-26 NOTE — Op Note (Signed)
 [facility] Operative Note  11/26/23  Preoperative Diagnosis: Symptomatic Cholelithiasis   Postoperative Diagnosis: Same   Procedure(s) Performed: Robotic Assisted Laparoscopic Cholecystectomy   Surgeon: Lidia Reels, DO   Assistants: No qualified resident was available    Anesthesia: General endotracheal   Anesthesiologist: Dr. Fredric Jean   Specimens: Gallbladder   Estimated Blood Loss: Minimal   Blood Replacement: None    Complications: None   Wound Class: Clean contaminated   Operative Indications: The patient was found to have cholelithiasis on imaging and was symptomatic.  We discussed the risk of the procedure including but not limited to bleeding, infection, injury to the common bile duct, bile leak, need for further procedures, chance of subtotal cholecystectomy.   Findings:  Noninflamed gallbladder with cholelithiasis Critical view of safety noted All clips intact at the end of the case Adequate hemostasis   Procedure: Firefly was given in the preoperative area.  The patient was taken to the operating room and placed supine. General endotracheal anesthesia was induced. Intravenous antibiotics were administered per protocol.  An orogastric tube positioned to decompress the stomach.  The abdomen was prepared and draped in the usual sterile fashion. A time-out was completed verifying correct patient, procedure, site, positioning, and implant(s) and/or special equipment prior to beginning this procedure.  Veress needle was placed at the infraumbilical area and insufflation was started after confirming a positive saline drop test and no immediate increase in abdominal pressure.  After reaching 15 mm, the Veress needle was removed and a 8 mm port was placed via optiview technique infraumbilical, measuring 20 mm away from the suspected position of the gallbladder.  The abdomen was inspected and no abnormalities or injuries were found.  Under direct  vision, ports were placed in the following locations in a semi curvilinear position around the target of the gallbladder: Two 8 mm ports on the patient's right each having 8cm clearance to the adjacent ports and one 8 mm port placed on the patient's left 8 cm from the umbilical port. Once ports were placed, the table was placed in the reverse Trendelenburg position with the right side up. The Xi platform was brought into the operative field and docked to the ports successfully.  An endoscope was placed through the umbilical port, prograsp through the most lateral right port, fenestrated bipolar to the port just right of the umbilicus, and then a hook cautery in the left port.  The dome of the gallbladder was grasped with prograsp and retracted over the dome of the liver. Adhesions between the gallbladder and omentum, duodenum and transverse colon were lysed via hook cautery. The infundibulum was grasped with the fenestrated grasper and retracted toward the right lower quadrant. This maneuver exposed Calot's triangle. Firefly was used throughout the dissection to ensure safe visualization of the cystic duct.  The peritoneum overlying the gallbladder infundibulum was then dissected and the cystic duct and cystic artery identified.  Critical view of safety with the liver bed clearly visible behind the duct and artery with no additional structures noted.  The cystic duct and cystic artery were doubly clipped and divided close to the gallbladder.    The gallbladder was then dissected from its peritoneal and liver bed attachments by electrocautery. Hemostasis was checked prior to removing the hook cautery.  The Tracie Fried was undocked and moved out of the field.  A 5mm Endo Catch bag was then placed through the umbilical port and the gallbladder was removed.  The gallbladder was passed off the table  as a specimen. There was no evidence of bleeding from the gallbladder fossa or cystic artery or leakage of the bile from the  cystic duct stump. The umbilical port site closed with a 0 vicryl in a running fashion.  The abdomen was desufflated and secondary trocars were removed under direct vision. No bleeding was noted. Incisions were localized with marcaine .  All skin incisions were closed with subcuticular sutures of 4-0 monocryl and dermabond.   Final inspection revealed acceptable hemostasis. All counts were correct at the end of the case. The patient was awakened from anesthesia and extubated without complication. The OG tube was removed.  The patient went to the PACU in stable condition.   Lidia Reels, DO [facility] [address] [place], [place] [address] [phone] (office)

## 2023-11-26 NOTE — Anesthesia Postprocedure Evaluation (Signed)
 Anesthesia Post Note  Patient: Dawn Barnes  Procedure(s) Performed: CHOLECYSTECTOMY, ROBOT-ASSISTED, LAPAROSCOPIC (Abdomen)  Patient location during evaluation: PACU Anesthesia Type: General Level of consciousness: awake and alert Pain management: pain level controlled Vital Signs Assessment: post-procedure vital signs reviewed and stable Respiratory status: spontaneous breathing, nonlabored ventilation, respiratory function stable and patient connected to nasal cannula oxygen Cardiovascular status: blood pressure returned to baseline and stable Postop Assessment: no apparent nausea or vomiting Anesthetic complications: no   There were no known notable events for this encounter.   Last Vitals:  Vitals:   11/26/23 1000 11/26/23 1006  BP: 121/80 127/83  Pulse: 80 81  Resp: (!) 24 (!) 24  Temp: 36.6 C 36.6 C  SpO2: 93% 94%    Last Pain:  Vitals:   11/26/23 1006  TempSrc:   PainSc: 3                  Kristianne Albin L Iman Orourke

## 2023-11-26 NOTE — Anesthesia Procedure Notes (Signed)
 Procedure Name: Intubation Date/Time: 11/26/2023 7:44 AM  Performed by: Verline Glow, CRNAPre-anesthesia Checklist: Patient identified, Patient being monitored, Timeout performed, Emergency Drugs available and Suction available Patient Re-evaluated:Patient Re-evaluated prior to induction Oxygen Delivery Method: Circle system utilized Preoxygenation: Pre-oxygenation with 100% oxygen Induction Type: IV induction Ventilation: Mask ventilation without difficulty Laryngoscope Size: Mac and 3 Grade View: Grade I Tube type: Oral Tube size: 7.0 mm Number of attempts: 1 Airway Equipment and Method: Stylet Placement Confirmation: ETT inserted through vocal cords under direct vision, positive ETCO2 and breath sounds checked- equal and bilateral Secured at: 21 cm Tube secured with: Tape Dental Injury: Teeth and Oropharynx as per pre-operative assessment

## 2023-11-26 NOTE — Transfer of Care (Signed)
 Immediate Anesthesia Transfer of Care Note  Patient: Dawn Barnes  Procedure(s) Performed: CHOLECYSTECTOMY, ROBOT-ASSISTED, LAPAROSCOPIC (Abdomen)  Patient Location: PACU  Anesthesia Type:General  Level of Consciousness: drowsy and patient cooperative  Airway & Oxygen Therapy: Patient Spontanous Breathing and Patient connected to nasal cannula oxygen  Post-op Assessment: Report given to RN and Post -op Vital signs reviewed and stable  Post vital signs: Reviewed and stable  Last Vitals:  Vitals Value Taken Time  BP 122/73 11/26/23  0/915  Temp 36.7 C 11/26/23 0914  Pulse 77 11/26/23 0914  Resp 29 11/26/23 0914  SpO2 93 % 11/26/23 0914    Last Pain:  Vitals:   11/26/23 0658  TempSrc:   PainSc: 0-No pain      Patients Stated Pain Goal: 6 (11/26/23 4098)  Complications: No notable events documented.

## 2023-11-26 NOTE — Progress Notes (Signed)
 [facility]  Spoke with the patient's son's girlfriend [place] on the phone.  I explained that she tolerated the procedure without difficulty.  She has dissolvable stitches under the skin with overlying skin glue.  This will flake off in 10 to 14 days.  I discharged her home with a prescription for narcotic pain medication that they should take as needed for pain.  I also want her taking scheduled Tylenol .  If they take the narcotic pain medication, they should take a stool softener as well.  The patient will follow-up with me in 2 weeks for phone follow-up.  All questions were answered to her expressed satisfaction.  Lidia Reels, DO [facility] [address] [place], [place] [address] [phone] (office)

## 2023-11-26 NOTE — Discharge Instructions (Addendum)
 Ambulatory Surgery Discharge Instructions  General Anesthesia or Sedation Do not drive or operate heavy machinery for 24 hours.  Do not consume alcohol, tranquilizers, sleeping medications, or any non-prescribed medications for 24 hours. Do not make important decisions or sign any important papers in the next 24 hours. You should have someone with you tonight at home.  Activity  You are advised to go directly home from the hospital.  Restrict your activities and rest for a day.  Resume light activity tomorrow. No heavy lifting over 10 lbs or strenuous exercise.  Fluids and Diet Begin with clear liquids, bouillon, dry toast, soda crackers.  If not nauseated, you may go to a regular diet when you desire.  Greasy and spicy foods are not advised.  Medications  If you have not had a bowel movement in 24 hours, take 2 tablespoons over the counter Milk of mag.             You May resume your blood thinners tomorrow (Aspirin, coumadin, or other).  You are being discharged with prescriptions for Opioid/Narcotic Medications: There are some specific considerations for these medications that you should know. Opioid Meds have risks & benefits. Addiction to these meds is always a concern with prolonged use Take medication only as directed Do not drive while taking narcotic pain medication Do not crush tablets or capsules Do not use a different container than medication was dispensed in Lock the container of medication in a cool, dry place out of reach of children and pets. Opioid medication can cause addiction Do not share with anyone else (this is a felony) Do not store medications for future use. Dispose of them properly.     Disposal:  Find a [employer] household drug take back site near you.  If you can't get to a drug take back site, use the recipe below as a last resort to dispose of expired, unused or unwanted drugs. Disposal  (Do not dispose chemotherapy drugs this way, talk to your  prescribing doctor instead.) Step 1: Mix drugs (do not crush) with dirt, kitty litter, or used coffee grounds and add a small amount of water to dissolve any solid medications. Step 2: Seal drugs in plastic bag. Step 3: Place plastic bag in trash. Step 4: Take prescription container and scratch out personal information, then recycle or throw away.  Operative Site  You have a liquid bandage over your incisions, this will begin to flake off in about a week. Ok to English as a second language teacher. Keep wound clean and dry. No baths or swimming. No lifting more than 10 pounds.  Contact Information: If you have questions or concerns, please call our office, [phone], Monday- Thursday 8AM-5PM and Friday 8AM-12Noon.  If it is after hours or on the weekend, please call [facility], [phone], and ask to speak to the surgeon on call for Dr. Robyne Peers at [facility].   SPECIFIC COMPLICATIONS TO WATCH FOR: Inability to urinate Fever over 101? F by mouth Nausea and vomiting lasting longer than 24 hours. Pain not relieved by medication ordered Swelling around the operative site Increased redness, warmth, hardness, around operative area Numbness, tingling, or cold fingers or toes Blood -soaked dressing, (small amounts of oozing may be normal) Increasing and progressive drainage from surgical area or exam site   Post Anesthesia Home Care Instructions  Activity: Get plenty of rest for the remainder of the day. A responsible individual must stay with you for 24 hours following the procedure.  For the next  24 hours, DO NOT: -Drive a car -Advertising copywriter -Drink alcoholic beverages -Take any medication unless instructed by your physician -Make any legal decisions or sign important papers.  Meals: Start with liquid foods such as gelatin or soup. Progress to regular foods as tolerated. Avoid greasy, spicy, heavy foods. If nausea and/or vomiting occur, drink only clear liquids until the nausea and/or  vomiting subsides. Call your physician if vomiting continues.  Special Instructions/Symptoms: Your throat may feel dry or sore from the anesthesia or the breathing tube placed in your throat during surgery. If this causes discomfort, gargle with warm salt water. The discomfort should disappear within 24 hours.  If you had a scopolamine patch placed behind your ear for the management of post- operative nausea and/or vomiting:  1. The medication in the patch is effective for 72 hours, after which it should be removed.  Wrap patch in a tissue and discard in the trash. Wash hands thoroughly with soap and water. 2. You may remove the patch earlier than 72 hours if you experience unpleasant side effects which may include dry mouth, dizziness or visual disturbances. 3. Avoid touching the patch. Wash your hands with soap and water after contact with the patch.

## 2023-11-27 LAB — SURGICAL PATHOLOGY

## 2023-12-12 ENCOUNTER — Ambulatory Visit (INDEPENDENT_AMBULATORY_CARE_PROVIDER_SITE_OTHER): Admitting: Surgery

## 2023-12-12 DIAGNOSIS — Z09 Encounter for follow-up examination after completed treatment for conditions other than malignant neoplasm: Secondary | ICD-10-CM

## 2023-12-12 NOTE — Progress Notes (Signed)
 [facility]  I am calling the patient for post operative evaluation. This is not a billable encounter as it is under the global charges for the surgery.  The patient had a robotic assisted laparoscopic cholecystectomy on 4/28. The patient reports that she is doing well. She is tolerating a diet, having good pain control, and having regular Bms.  The incisions are healing well.  Most of the glue is still present.  Advised that she can remove any flaking glue.  If there is any areas with glue stuck on, she can apply antibiotic ointment to the area to help loosen the remaining skin glue. The patient has no concerns.   Pathology: A. GALLBLADDER, CHOLECYSTECTOMY:  - Chronic cholecystitis and cholelithiasis   Will see the patient PRN.   Lidia Reels, DO [facility] [address] [place], [place] [address] [phone] (office)

## 2023-12-20 ENCOUNTER — Other Ambulatory Visit: Payer: Self-pay | Admitting: Gastroenterology

## 2023-12-25 ENCOUNTER — Ambulatory Visit (INDEPENDENT_AMBULATORY_CARE_PROVIDER_SITE_OTHER): Payer: Medicaid Other | Admitting: Gastroenterology

## 2023-12-25 ENCOUNTER — Encounter: Payer: Self-pay | Admitting: Gastroenterology

## 2023-12-25 VITALS — BP 120/82 | HR 73 | Temp 97.9°F | Ht 64.0 in | Wt 209.6 lb

## 2023-12-25 DIAGNOSIS — K219 Gastro-esophageal reflux disease without esophagitis: Secondary | ICD-10-CM

## 2023-12-25 DIAGNOSIS — K760 Fatty (change of) liver, not elsewhere classified: Secondary | ICD-10-CM | POA: Diagnosis not present

## 2023-12-25 DIAGNOSIS — D649 Anemia, unspecified: Secondary | ICD-10-CM

## 2023-12-25 DIAGNOSIS — K5900 Constipation, unspecified: Secondary | ICD-10-CM

## 2023-12-25 NOTE — Progress Notes (Signed)
 Gastroenterology Office Note     Primary Care Physician:  Wyvonna Heidelberg, MD  Primary Gastroenterologist: Dr. Mordechai April   Chief Complaint   Chief Complaint  Patient presents with   Follow-up    Patient here today for a follow up on Constipation and Gerd. Patient says she is doing better with the constipation on amitzia 24 mcg bid. She is doing well with Tonna Frederic while on omeprazole 40 mg daily. She has occasions when she eats the wrong things she will have an issues. Patient had her gallbladder removed in November 26, 2023.     History of Present Illness   Dawn Barnes is a 58 y.o. female presenting today with a history of GERD, chronic dysphagia, chronic constipation. Colonoscopy with mild proctitis in setting of NSAIDs 2017. Edentulous. Last EGD in May 2017. In interim from last visit, she underwent cholecystectomy.   Taking Amitiza  24 mcg po BID. BM about every other day. For most part feels emptying well. Trying to eat more fiber. Avoiding spicy foods. One flare of GERD. Notes flares with certain types of foods like red sauce. No pain now with eating foods like mac n cheese that used to cause pain.    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   Past Medical History:  Diagnosis Date   Anemia    Anxiety    Constipation 04/30/2013   Depression    Diabetes mellitus without complication (HCC)    borderline   Fibroids, intramural 01/21/2016   GERD (gastroesophageal reflux disease)    Heartburn    Hot flashes 01/11/2016   Hypertension    Hypothyroidism    Mixed incontinence    Pancreatitis    PONV (postoperative nausea and vomiting)    Pre-diabetes    PTSD (post-traumatic stress disorder)    Thyroid  disease    Trauma    Urge urinary incontinence 05/10/2015   Vaginal bleeding 01/11/2016    Past Surgical History:  Procedure Laterality Date   BALLOON  DILATION N/A 10/22/2023   Procedure: BALLOON DILATION;  Surgeon: Vinetta Greening, DO;  Location: [facility];  Service: Endoscopy;  Laterality: N/A;   BIOPSY  12/07/2015   Procedure: BIOPSY;  Surgeon: Alyce Jubilee, MD;  Location: [facility];  Service: Endoscopy;;  right and left colon biopsies, gastric bx's    BLADDER REPAIR     COLONOSCOPY WITH PROPOFOL  N/A 12/07/2015   Dr. Nolene Baumgarten: mild proctitis, hyperplastic polyp    ENDOMETRIAL ABLATION     ESOPHAGOGASTRODUODENOSCOPY (EGD) WITH PROPOFOL  N/A 12/07/2015   Dr. Nolene Baumgarten: possible web in proximal esophagus s/p dilation, erosive gastritis (negative H.pylori).    ESOPHAGOGASTRODUODENOSCOPY (EGD) WITH PROPOFOL  N/A 10/22/2023   Procedure: ESOPHAGOGASTRODUODENOSCOPY (EGD) WITH PROPOFOL ;  Surgeon: Vinetta Greening, DO;  Location: [facility];  Service: Endoscopy;  Laterality: N/A;  11:30 AM, ASA 3   INCONTINENCE SURGERY     KNEE ARTHROSCOPY WITH MEDIAL MENISECTOMY Left 11/17/2021   Procedure: KNEE ARTHROSCOPY WITH MEDIAL MENISECTOMY;  Surgeon: Tonita Frater, MD;  Location: [facility];  Service: Orthopedics;  Laterality: Left;   MULTIPLE TOOTH EXTRACTIONS     SAVORY DILATION N/A 12/07/2015   Procedure: SAVORY DILATION;  Surgeon: Alyce Jubilee, MD;  Location: [facility];  Service: Endoscopy;  Laterality: N/A;   TUBAL LIGATION      Current Outpatient Medications  Medication Sig Dispense Refill   cetirizine (ZYRTEC) 10  MG tablet Take 10 mg by mouth daily.     Cholecalciferol (VITAMIN D -3) 125 MCG (5000 UT) TABS Take 5,000 Units by mouth daily.     cyanocobalamin  (VITAMIN B12) 1000 MCG tablet Take 1,000 mcg by mouth daily.     escitalopram (LEXAPRO) 20 MG tablet Take 20 mg by mouth daily.     hydrochlorothiazide (HYDRODIURIL) 12.5 MG tablet Take 12.5 mg by mouth daily.     levothyroxine  (SYNTHROID ) 75 MCG tablet Take 1 tablet (75 mcg total) by mouth daily before breakfast. 90 tablet 3   lubiprostone  (AMITIZA ) 24 MCG capsule TAKE ONE  CAPSULE BY MOUTH TWICE DAILY WITH MEALS 60 capsule 3   omeprazole (PRILOSEC) 20 MG capsule Take 40 mg by mouth daily.     polyethylene glycol (MIRALAX / GLYCOLAX) 17 g packet Take 17 g by mouth daily as needed for moderate constipation.     TOVIAZ 8 MG TB24 tablet Take 8 mg by mouth daily.     No current facility-administered medications for this visit.    Allergies as of 12/25/2023 - Review Complete 12/25/2023  Allergen Reaction Noted   Ibuprofen Other (See Comments) 05/30/2021   Nsaids Other (See Comments) 01/17/2016   Tuberculin tests Dermatitis 01/18/2015    Family History  Problem Relation Age of Onset   Diabetes Father    Heart disease Father    Hypertension Sister    Heart disease Sister    Stroke Paternal Aunt    Dementia Paternal Aunt    Hypertension Paternal Aunt    Heart disease Paternal Aunt    Seizures Paternal Aunt    Breast cancer Paternal Aunt    Stroke Paternal Grandmother    Heart disease Paternal Grandmother    Depression Daughter    Depression Daughter    Depression Son    ADD / ADHD Son    Colon cancer Neg Hx    Colon polyps Neg Hx     Social History   Socioeconomic History   Marital status: Divorced    Spouse name: Not on file   Number of children: Not on file   Years of education: Not on file   Highest education level: Not on file  Occupational History   Occupation: disability  Tobacco Use   Smoking status: Never   Smokeless tobacco: Never  Vaping Use   Vaping status: Never Used  Substance and Sexual Activity   Alcohol use: No    Alcohol/week: 0.0 standard drinks of alcohol   Drug use: No   Sexual activity: Not Currently    Birth control/protection: Surgical    Comment: tubal and ablation  Other Topics Concern   Not on file  Social History Narrative   Are you right handed or left handed?    Are you currently employed ?    What is your current occupation?   Do you live at home alone?   Who lives with you?    What type of home do  you live in: 1 story or 2 story? 1       Social Drivers of Corporate investment banker Strain: Low Risk  (11/02/2022)   Overall Financial Resource Strain (CARDIA)    Difficulty of Paying Living Expenses: Not hard at all  Food Insecurity: Low Risk  (05/01/2023)   Received from [facility]   Hunger Vital Sign    Worried About Running Out of Food in the Last Year: Never true    Ran Out of Food in the  Last Year: Never true  Transportation Needs: No Transportation Needs (05/01/2023)   Received from [facility]    In the past 12 months, has lack of reliable transportation kept you from medical appointments, meetings, work or from getting things needed for daily living? : No  Physical Activity: Inactive (11/02/2022)   Exercise Vital Sign    Days of Exercise per Week: 0 days    Minutes of Exercise per Session: 10 min  Stress: Stress Concern Present (11/02/2022)   [employer] of Occupational Health - Occupational Stress Questionnaire    Feeling of Stress : To some extent  Social Connections: Socially Isolated (11/02/2022)   Social Connection and Isolation Panel [NHANES]    Frequency of Communication with Friends and Family: Three times a week    Frequency of Social Gatherings with Friends and Family: Once a week    Attends Religious Services: Never    Database administrator or Organizations: No    Attends Banker Meetings: Never    Marital Status: Divorced  Catering manager Violence: Not At Risk (11/02/2022)   Humiliation, Afraid, Rape, and Kick questionnaire    Fear of Current or Ex-Partner: No    Emotionally Abused: No    Physically Abused: No    Sexually Abused: No     Review of Systems   Gen: Denies any fever, chills, fatigue, weight loss, lack of appetite.  CV: Denies chest pain, heart palpitations, peripheral edema, syncope.  Resp: Denies shortness of breath at rest or with exertion. Denies wheezing or cough.  GI: Denies dysphagia or odynophagia.  Denies jaundice, hematemesis, fecal incontinence. GU : Denies urinary burning, urinary frequency, urinary hesitancy MS: Denies joint pain, muscle weakness, cramps, or limitation of movement.  Derm: Denies rash, itching, dry skin Psych: Denies depression, anxiety, memory loss, and confusion Heme: Denies bruising, bleeding, and enlarged lymph nodes.   Physical Exam   BP 120/82 (BP Location: Right Arm, Patient Position: Sitting, Cuff Size: Large)   Pulse 73   Temp 97.9 F (36.6 C) (Oral)   Ht 5\' 4"  (1.626 m)   Wt 209 lb 9.6 oz (95.1 kg)   BMI 35.98 kg/m  General:   Alert and oriented. Pleasant and cooperative. Well-nourished and well-developed.  Head:  Normocephalic and atraumatic. Eyes:  Without icterus Abdomen:  +BS, soft, non-tender and non-distended. No HSM noted. No guarding or rebound. No masses appreciated.  Rectal:  Deferred  Msk:  Symmetrical without gross deformities. Normal posture. Extremities:  Without edema. Neurologic:  Alert and  oriented x4;  grossly normal neurologically. Skin:  Intact without significant lesions or rashes. Psych:  Alert and cooperative. Normal mood and affect.    Assessment   Dawn Barnes is a 58 y.o. female presenting today with a history of GERD, chronic dysphagia, chronic constipation. Colonoscopy with mild proctitis in setting of NSAIDs 2017. Edentulous. Last EGD in May 2017. In interim from last visit, she underwent cholecystectomy.   GERD: overall controlled on omeprazole daily, doing well if avoids trigger foods.   Constipation: doing well with Amitiza  24 mcg po BID.   Mild anemia noted on recent labs. Will update labs and add iron. If IDA, needs updated colonoscopy/EGD as last in 2017.   Hepatic steatosis: on US . Doubt advanced liver disease. Risk factors for MASLD/MASH. Will check HFP and ELF.      PLAN    Continue PPI daily Continue Amitiza  24 mcg BID CBC, iron studies. If IDA, needs updated TCS/EGD  Check HFP and ELF 6  month follow-up   Delman Ferns, PhD, ANP-BC [facility]

## 2023-12-25 NOTE — Patient Instructions (Signed)
 I have ordered labs to be done. If you have iron deficiency, we will arrange a colonoscopy and upper endoscopy.  We will see you in 6 months regardless!  I enjoyed seeing you again today! I value our relationship and want to provide genuine, compassionate, and quality care. You may receive a survey regarding your visit with me, and I welcome your feedback! Thanks so much for taking the time to complete this. I look forward to seeing you again.      Delman Ferns, PhD, ANP-BC [facility]

## 2023-12-26 ENCOUNTER — Other Ambulatory Visit: Payer: Self-pay

## 2023-12-26 ENCOUNTER — Ambulatory Visit
Admission: RE | Admit: 2023-12-26 | Discharge: 2023-12-26 | Disposition: A | Discharge status: Home / Self Care | Payer: Self-pay | Source: Ambulatory Visit | Attending: Nurse Practitioner | Admitting: Nurse Practitioner

## 2023-12-26 VITALS — BP 121/84 | HR 71 | Temp 98.1°F | Resp 20

## 2023-12-26 DIAGNOSIS — R300 Dysuria: Secondary | ICD-10-CM | POA: Diagnosis present

## 2023-12-26 DIAGNOSIS — R350 Frequency of micturition: Secondary | ICD-10-CM | POA: Insufficient documentation

## 2023-12-26 DIAGNOSIS — R399 Unspecified symptoms and signs involving the genitourinary system: Secondary | ICD-10-CM | POA: Insufficient documentation

## 2023-12-26 LAB — IRON,TIBC AND FERRITIN PANEL
Ferritin: 21 ng/mL (ref 15–150)
Iron Saturation: 13 % — ABNORMAL LOW (ref 15–55)
Iron: 53 ug/dL (ref 27–159)
Total Iron Binding Capacity: 412 ug/dL (ref 250–450)
UIBC: 359 ug/dL (ref 131–425)

## 2023-12-26 LAB — CBC WITH DIFFERENTIAL/PLATELET
Basophils Absolute: 0.1 10*3/uL (ref 0.0–0.2)
Basos: 1 %
EOS (ABSOLUTE): 0.1 10*3/uL (ref 0.0–0.4)
Eos: 2 %
Hematocrit: 39.3 % (ref 34.0–46.6)
Hemoglobin: 12.5 g/dL (ref 11.1–15.9)
Immature Grans (Abs): 0 10*3/uL (ref 0.0–0.1)
Immature Granulocytes: 1 %
Lymphocytes Absolute: 1.7 10*3/uL (ref 0.7–3.1)
Lymphs: 27 %
MCH: 29.2 pg (ref 26.6–33.0)
MCHC: 31.8 g/dL (ref 31.5–35.7)
MCV: 92 fL (ref 79–97)
Monocytes Absolute: 0.4 10*3/uL (ref 0.1–0.9)
Monocytes: 7 %
Neutrophils Absolute: 3.9 10*3/uL (ref 1.4–7.0)
Neutrophils: 62 %
Platelets: 287 10*3/uL (ref 150–450)
RBC: 4.28 x10E6/uL (ref 3.77–5.28)
RDW: 13.2 % (ref 11.7–15.4)
WBC: 6.2 10*3/uL (ref 3.4–10.8)

## 2023-12-26 LAB — POCT URINALYSIS DIP (MANUAL ENTRY)
Bilirubin, UA: NEGATIVE
Glucose, UA: NEGATIVE mg/dL
Ketones, POC UA: NEGATIVE mg/dL
Nitrite, UA: NEGATIVE
Protein Ur, POC: NEGATIVE mg/dL
Spec Grav, UA: 1.02 (ref 1.010–1.025)
Urobilinogen, UA: 0.2 U/dL
pH, UA: 6 (ref 5.0–8.0)

## 2023-12-26 LAB — HEPATIC FUNCTION PANEL
ALT: 15 IU/L (ref 0–32)
AST: 19 IU/L (ref 0–40)
Albumin: 4.2 g/dL (ref 3.8–4.9)
Alkaline Phosphatase: 95 IU/L (ref 44–121)
Bilirubin Total: <0.2 mg/dL (ref 0.0–1.2)
Bilirubin, Direct: 0.08 mg/dL (ref 0.00–0.40)
Total Protein: 7.1 g/dL (ref 6.0–8.5)

## 2023-12-26 LAB — ENHANCED LIVER FIBROSIS (ELF): ELF(TM) Score: 9.32 (ref <9.80)

## 2023-12-26 MED ORDER — NITROFURANTOIN MONOHYD MACRO 100 MG PO CAPS
100.0000 mg | ORAL_CAPSULE | Freq: Two times a day (BID) | ORAL | 0 refills | Status: DC
Start: 2023-12-26 — End: 2024-05-29

## 2023-12-26 NOTE — ED Provider Notes (Signed)
 [facility]    CSN: [id number] Arrival date & time: 12/26/23  1350      History   Chief Complaint Chief Complaint  Patient presents with   Urinary Frequency    Entered by patient    HPI Dawn Barnes is a 58 y.o. female.   The history is provided by the patient.   Patient presents with a 4-day history of urinary frequency, urgency, pain with urination, foul-smelling urine, and urinary hesitancy.  Patient denies fever, chills, flank pain, low back pain, hematuria, or vaginal symptoms.  Patient further denies prior history of kidney stones or kidney infection.  Denies history of recurrent UTIs.  Past Medical History:  Diagnosis Date   Anemia    Anxiety    Constipation 04/30/2013   Depression    Diabetes mellitus without complication (HCC)    borderline   Fibroids, intramural 01/21/2016   GERD (gastroesophageal reflux disease)    Heartburn    Hot flashes 01/11/2016   Hypertension    Hypothyroidism    Mixed incontinence    Pancreatitis    PONV (postoperative nausea and vomiting)    Pre-diabetes    PTSD (post-traumatic stress disorder)    Thyroid  disease    Trauma    Urge urinary incontinence 05/10/2015   Vaginal bleeding 01/11/2016    Patient Active Problem List   Diagnosis Date Noted   Calculus of gallbladder without cholecystitis without obstruction 11/26/2023   Abdominal pain 09/27/2023   Encounter for screening fecal occult blood testing 11/02/2022   Routine general medical examination at a health care facility 11/02/2022   Encounter for routine gynecological examination with Papanicolaou smear of cervix 11/02/2022   Urinary incontinence 05/26/2022   Osteoarthrosis 05/26/2022   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   Screening for colorectal cancer 10/13/2019   Encounter for gynecological examination with Papanicolaou smear of cervix 10/13/2019   GERD (gastroesophageal reflux disease) 03/12/2019   Loss of weight 07/19/2018   Goiter 03/13/2018   Pelvic  pain in female 05/16/2017   History of posttraumatic stress disorder (PTSD) 05/15/2016   Burning with urination 05/15/2016   Encounter for well woman exam with routine gynecological exam 05/15/2016   Fibroids, intramural 01/21/2016   Vaginal bleeding 01/11/2016   Hot flashes 01/11/2016   Rectal bleeding 11/03/2015   Dysphagia 11/03/2015   Pre-diabetes 10/25/2015   Vitamin D  deficiency 10/25/2015   Obesity (BMI 30-39.9) 10/25/2015   Urge urinary incontinence 05/10/2015   Radicular low back pain 10/23/2013   Depression 04/30/2013   Hypothyroidism 04/30/2013   Constipation 04/30/2013    Past Surgical History:  Procedure Laterality Date   BALLOON DILATION N/A 10/22/2023   Procedure: BALLOON DILATION;  Surgeon: Vinetta Greening, DO;  Location: [facility];  Service: Endoscopy;  Laterality: N/A;   BIOPSY  12/07/2015   Procedure: BIOPSY;  Surgeon: Alyce Jubilee, MD;  Location: [facility];  Service: Endoscopy;;  right and left colon biopsies, gastric bx's    BLADDER REPAIR     COLONOSCOPY WITH PROPOFOL  N/A 12/07/2015   Dr. Nolene Baumgarten: mild proctitis, hyperplastic polyp    ENDOMETRIAL ABLATION     ESOPHAGOGASTRODUODENOSCOPY (EGD) WITH PROPOFOL  N/A 12/07/2015   Dr. Nolene Baumgarten: possible web in proximal esophagus s/p dilation, erosive gastritis (negative H.pylori).    ESOPHAGOGASTRODUODENOSCOPY (EGD) WITH PROPOFOL  N/A 10/22/2023   Procedure: ESOPHAGOGASTRODUODENOSCOPY (EGD) WITH PROPOFOL ;  Surgeon: Vinetta Greening, DO;  Location: [facility];  Service: Endoscopy;  Laterality: N/A;  11:30 AM, ASA 3   INCONTINENCE SURGERY  KNEE ARTHROSCOPY WITH MEDIAL MENISECTOMY Left 11/17/2021   Procedure: KNEE ARTHROSCOPY WITH MEDIAL MENISECTOMY;  Surgeon: Tonita Frater, MD;  Location: [facility];  Service: Orthopedics;  Laterality: Left;   MULTIPLE TOOTH EXTRACTIONS     SAVORY DILATION N/A 12/07/2015   Procedure: SAVORY DILATION;  Surgeon: Alyce Jubilee, MD;  Location: [facility];  Service:  Endoscopy;  Laterality: N/A;   TUBAL LIGATION      OB History     Gravida  3   Para  3   Term  3   Preterm      AB      Living  3      SAB      IAB      Ectopic      Multiple      Live Births  3            Home Medications    Prior to Admission medications   Medication Sig Start Date End Date Taking? Authorizing Provider  nitrofurantoin, macrocrystal-monohydrate, (MACROBID) 100 MG capsule Take 1 capsule (100 mg total) by mouth 2 (two) times daily. 12/26/23  Yes Leath-Warren, Belen Bowers, NP  cetirizine (ZYRTEC) 10 MG tablet Take 10 mg by mouth daily.    [provider]  Cholecalciferol (VITAMIN D -3) 125 MCG (5000 UT) TABS Take 5,000 Units by mouth daily.    [provider]  cyanocobalamin  (VITAMIN B12) 1000 MCG tablet Take 1,000 mcg by mouth daily.    [provider]  escitalopram (LEXAPRO) 20 MG tablet Take 20 mg by mouth daily.    [provider]  hydrochlorothiazide (HYDRODIURIL) 12.5 MG tablet Take 12.5 mg by mouth daily. 10/31/21   [provider]  levothyroxine  (SYNTHROID ) 75 MCG tablet Take 1 tablet (75 mcg total) by mouth daily before breakfast. 02/12/23   Wendel Hals, NP  lubiprostone  (AMITIZA ) 24 MCG capsule TAKE ONE CAPSULE BY MOUTH TWICE DAILY WITH MEALS 12/20/23   Delman Ferns, NP  omeprazole (PRILOSEC) 20 MG capsule Take 40 mg by mouth daily.    [provider]  polyethylene glycol (MIRALAX / GLYCOLAX) 17 g packet Take 17 g by mouth daily as needed for moderate constipation.    [provider]  TOVIAZ 8 MG TB24 tablet Take 8 mg by mouth daily. 03/02/20   [provider]    Family History Family History  Problem Relation Age of Onset   Diabetes Father    Heart disease Father    Hypertension Sister    Heart disease Sister    Stroke Paternal Aunt    Dementia Paternal Aunt    Hypertension Paternal Aunt    Heart disease Paternal Aunt    Seizures Paternal Aunt    Breast  cancer Paternal Aunt    Stroke Paternal Grandmother    Heart disease Paternal Grandmother    Depression Daughter    Depression Daughter    Depression Son    ADD / ADHD Son    Colon cancer Neg Hx    Colon polyps Neg Hx     Social History Social History   Tobacco Use   Smoking status: Never   Smokeless tobacco: Never  Vaping Use   Vaping status: Never Used  Substance Use Topics   Alcohol use: No    Alcohol/week: 0.0 standard drinks of alcohol   Drug use: No     Allergies   Ibuprofen, Nsaids, and Tuberculin tests   Review of Systems Review of Systems  Per HPI  Physical Exam Triage Vital Signs ED Triage Vitals  Encounter Vitals Group     BP 12/26/23 1405 121/84     Systolic BP Percentile --      Diastolic BP Percentile --      Pulse Rate 12/26/23 1405 71     Resp 12/26/23 1405 20     Temp 12/26/23 1405 98.1 F (36.7 C)     Temp Source 12/26/23 1405 Oral     SpO2 12/26/23 1405 98 %     Weight --      Height --      Head Circumference --      Peak Flow --      Pain Score 12/26/23 1404 0     Pain Loc --      Pain Education --      Exclude from Growth Chart --    No data found.  Updated Vital Signs BP 121/84 (BP Location: Right Arm)   Pulse 71   Temp 98.1 F (36.7 C) (Oral)   Resp 20   SpO2 98%   Visual Acuity Right Eye Distance:   Left Eye Distance:   Bilateral Distance:    Right Eye Near:   Left Eye Near:    Bilateral Near:     Physical Exam Vitals and nursing note reviewed.  Constitutional:      General: She is not in acute distress.    Appearance: Normal appearance.  HENT:     Head: Normocephalic.  Eyes:     Extraocular Movements: Extraocular movements intact.     Pupils: Pupils are equal, round, and reactive to light.  Cardiovascular:     Rate and Rhythm: Regular rhythm.     Pulses: Normal pulses.     Heart sounds: Normal heart sounds.  Pulmonary:     Effort: Pulmonary effort is normal.     Breath sounds: Normal breath sounds.   Abdominal:     General: Bowel sounds are normal.     Palpations: Abdomen is soft.     Tenderness: There is no abdominal tenderness. There is no right CVA tenderness or left CVA tenderness.  Musculoskeletal:     Cervical back: Normal range of motion.  Lymphadenopathy:     Cervical: No cervical adenopathy.  Skin:    General: Skin is warm and dry.  Neurological:     General: No focal deficit present.     Mental Status: She is alert and oriented to person, place, and time.  Psychiatric:        Mood and Affect: Mood normal.        Behavior: Behavior normal.      UC Treatments / Results  Labs (all labs ordered are listed, but only abnormal results are displayed) Labs Reviewed  POCT URINALYSIS DIP (MANUAL ENTRY) - Abnormal; Notable for the following components:      Result Value   Clarity, UA cloudy (*)    Blood, UA trace-lysed (*)    Leukocytes, UA Small (1+) (*)    All other components within normal limits  URINE CULTURE    EKG   Radiology No results found.  Procedures Procedures (including critical care time)  Medications Ordered in UC Medications - No data to display  Initial Impression / Assessment and Plan / UC Course  I have reviewed the triage vital signs and the nursing notes.  Pertinent labs & imaging results that were available during my care of the patient were reviewed by me and  considered in my medical decision making (see chart for details).  Patient presents with UTI symptoms to include dysuria, urinary frequency.  Urinalysis did not indicate an obvious urinary tract infection, urine analysis was positive for small leukocytes and trace blood.  Urine culture is pending.  In the interim, patient advised to increase her fluid intake, over-the-counter analgesics, avoiding caffeine, and to develop with toileting schedule.  Patient was advised that if the result of the culture is negative and she is continuing to experience symptoms, recommend following up with  her PCP for further evaluation.  Patient was in agreement with this plan of care and verbalizes understanding.  All questions were answered.  Patient stable for discharge.   Final Clinical Impressions(s) / UC Diagnoses   Final diagnoses:  Dysuria  Urinary frequency  UTI symptoms     Discharge Instructions      A urine culture has been ordered.  You will be contacted if the pending test results are abnormal.  You also have access to your results via MyChart.  You will also be contacted if the result of the culture is negative and advised to stop the antibiotic prescribed today. Take medication as prescribed.  Try to drink at least 8-10 8 ounce glasses of water  daily. You may take over-the-counter Tylenol  or ibuprofen as needed for pain, fever, or general discomfort. Develop a toileting schedule that will allow you to urinate at least every 2 hours. Avoid caffeine such as tea, soda, or coffee while symptoms persist. If the result of the culture is negative and you are continue to experience symptoms, recommend follow-up with your primary care physician for further evaluation. Follow-up as needed.   ED Prescriptions     Medication Sig Dispense Auth. Provider   nitrofurantoin, macrocrystal-monohydrate, (MACROBID) 100 MG capsule Take 1 capsule (100 mg total) by mouth 2 (two) times daily. 10 capsule Leath-Warren, Belen Bowers, NP      PDMP not reviewed this encounter.   Hardy Lia, NP 12/26/23 2041

## 2023-12-26 NOTE — ED Notes (Signed)
 Pt voided and missed urine cup. Pt provided water  and will re-attempt sample. Provider aware.

## 2023-12-26 NOTE — ED Triage Notes (Signed)
 Pt reports urinary frequency, urgency, dysuria, decreased output with voiding x3-4 days.

## 2023-12-26 NOTE — Discharge Instructions (Addendum)
 A urine culture has been ordered.  You will be contacted if the pending test results are abnormal.  You also have access to your results via MyChart.  You will also be contacted if the result of the culture is negative and advised to stop the antibiotic prescribed today. Take medication as prescribed.  Try to drink at least 8-10 8 ounce glasses of water  daily. You may take over-the-counter Tylenol  or ibuprofen as needed for pain, fever, or general discomfort. Develop a toileting schedule that will allow you to urinate at least every 2 hours. Avoid caffeine such as tea, soda, or coffee while symptoms persist. If the result of the culture is negative and you are continue to experience symptoms, recommend follow-up with your primary care physician for further evaluation. Follow-up as needed.

## 2023-12-28 ENCOUNTER — Ambulatory Visit: Payer: Self-pay

## 2023-12-28 LAB — URINE CULTURE: Culture: 30000 — AB

## 2024-01-07 ENCOUNTER — Ambulatory Visit: Payer: Self-pay | Admitting: Gastroenterology

## 2024-01-17 ENCOUNTER — Other Ambulatory Visit: Payer: Self-pay | Admitting: *Deleted

## 2024-01-17 ENCOUNTER — Encounter: Payer: Self-pay | Admitting: *Deleted

## 2024-01-17 DIAGNOSIS — D649 Anemia, unspecified: Secondary | ICD-10-CM

## 2024-01-17 DIAGNOSIS — K760 Fatty (change of) liver, not elsewhere classified: Secondary | ICD-10-CM

## 2024-01-17 MED ORDER — PEG 3350-KCL-NA BICARB-NACL 420 G PO SOLR: 4000.0000 mL | Freq: Once | ORAL | 0 refills | Status: AC

## 2024-01-17 NOTE — Addendum Note (Signed)
 Addended by: Alvester Johnson on: 01/17/2024 09:54 AM   Modules accepted: Orders

## 2024-01-17 NOTE — Anesthesia Preprocedure Evaluation (Addendum)
 Anesthesia Evaluation  Patient identified by MRN, date of birth, ID band Patient awake    Reviewed: Allergy & Precautions, NPO status , Patient's Chart, lab work & pertinent test results  History of Anesthesia Complications (+) PONV and history of anesthetic complications  Airway Mallampati: III  TM Distance: >3 FB Neck ROM: Full    Dental  (+) Edentulous Upper, Edentulous Lower   Pulmonary neg pulmonary ROS   Pulmonary exam normal breath sounds clear to auscultation       Cardiovascular hypertension (136/77 preop), Pt. on medications Normal cardiovascular exam Rhythm:Regular Rate:Normal     Neuro/Psych  PSYCHIATRIC DISORDERS Anxiety Depression    negative neurological ROS     GI/Hepatic Neg liver ROS,GERD  Medicated and Controlled,,  Endo/Other  diabetes (prediabetic)Hypothyroidism    Renal/GU negative Renal ROS Bladder dysfunction      Musculoskeletal  (+) Arthritis , Osteoarthritis,    Abdominal  (+) + obese  Peds  Hematology negative hematology ROS (+)   Anesthesia Other Findings   Reproductive/Obstetrics negative OB ROS                             Anesthesia Physical Anesthesia Plan  ASA: 3  Anesthesia Plan: General   Post-op Pain Management: Tylenol  PO (pre-op)*   Induction: Intravenous  PONV Risk Score and Plan: 4 or greater and Ondansetron , Dexamethasone , Propofol  infusion, TIVA, Treatment may vary due to age or medical condition and Midazolam   Airway Management Planned: LMA  Additional Equipment: None  Intra-op Plan:   Post-operative Plan: Extubation in OR  Informed Consent: I have reviewed the patients History and Physical, chart, labs and discussed the procedure including the risks, benefits and alternatives for the proposed anesthesia with the patient or authorized representative who has indicated his/her understanding and acceptance.     Dental advisory  given  Plan Discussed with: CRNA  Anesthesia Plan Comments:        Anesthesia Quick Evaluation

## 2024-01-18 ENCOUNTER — Encounter (HOSPITAL_BASED_OUTPATIENT_CLINIC_OR_DEPARTMENT_OTHER): Payer: Self-pay | Admitting: Otolaryngology

## 2024-01-18 ENCOUNTER — Other Ambulatory Visit: Payer: Self-pay

## 2024-01-18 NOTE — H&P (Signed)
 HPI:   Dawn Barnes is a 58 y.o. female who presents as a consult Patient.   Referring Provider: Fanta, Tesfaye Demissie*  Chief complaint: Ear problems.  HPI: Complains of fullness in her ears, hearing loss and ringing for many months. It is worse on the right. She has a history of a lot of ear problems as a child. She was told she might have a perforated eardrum. She denies any sore throat or left-sided ear pain recently.  PMH/Meds/All/SocHx/FamHx/ROS:   History reviewed. No pertinent past medical history.  History reviewed. No pertinent surgical history.  No family history of bleeding disorders, wound healing problems or difficulty with anesthesia.   Social History   Socioeconomic History   Marital status: Unknown  Spouse name: Not on file   Number of children: Not on file   Years of education: Not on file   Highest education level: Not on file  Occupational History   Not on file  Tobacco Use   Smoking status: Never  Passive exposure: Never   Smokeless tobacco: Never  Vaping Use   Vaping Use: Never used  Substance and Sexual Activity   Alcohol use: Never   Drug use: Never   Sexual activity: Not on file  Other Topics Concern   Not on file  Social History Narrative   Not on file   Social Determinants of Health   Food Insecurity: Not on file  Transportation Needs: Not on file  Living Situation: Not on file   Current Outpatient Medications:   cetirizine (ZYRTEC) 10 MG tablet, Take 1 tablet (10 mg total) by mouth daily., Disp: , Rfl:   dimethyl fumarate (TECFIDERA) 240 mg delayed-release capsule, Take 1 capsule (240 mg total) by mouth 2 times daily., Disp: , Rfl:   escitalopram oxalate (LEXAPRO) 20 MG tablet, Take 1 tablet (20 mg total) by mouth daily., Disp: , Rfl:   gabapentin (NEURONTIN) 600 MG tablet, , Disp: , Rfl:   hydroCHLOROthiazide 12.5 MG tablet, Take 1 tablet (12.5 mg total) by mouth., Disp: , Rfl:   levothyroxine  (SYNTHROID ) 75 MCG tablet, Take 1 tablet  (75 mcg total) by mouth., Disp: , Rfl:   LINZESS  290 mcg capsule, TAKE 1 CAPSULE BY MOUTH DAILY BEFORE BREAKFAST., Disp: , Rfl:   methocarbamoL (ROBAXIN) 500 MG tablet, Take 1 tablet (500 mg total) by mouth., Disp: , Rfl:   omeprazole (PRILOSEC) 20 MG DR capsule, Take 2 capsules (40 mg total) by mouth daily., Disp: , Rfl:   psyllium (METAMUCIL) 3.4 gram, Take 1 packet by mouth., Disp: , Rfl:   TOVIAZ 8 mg ER tablet, , Disp: , Rfl:   A complete ROS was performed with pertinent positives/negatives noted in the HPI. The remainder of the ROS are negative.   Physical Exam:   Pulse 76  Temp 97.7 F (36.5 C) (Temporal)  Resp 18  Ht 1.626 m (5' 4)  Wt 96.1 kg (211 lb 12.8 oz)  SpO2 94%  BMI 36.36 kg/m   General: Healthy and alert, in no distress, breathing easily. Normal affect. In a pleasant mood. Head: Normocephalic, atraumatic. No masses, or scars. Eyes: Pupils are equal, and reactive to light. Vision is grossly intact. No spontaneous or gaze nystagmus. Ears: Ear canals are clear. Tympanic membranes are intact, with normal landmarks and clear middle ear on the left, there is a small perforation on the right with clean edges and the middle ear mucosa looks clear. There may be adhesion of the drum to the ossicular chain posteriorly but  no obvious cholesteatoma. Hearing: Grossly normal. Nose: Nasal cavities are clear with healthy mucosa, no polyps or exudate. Airways are patent. Face: No masses or scars, facial nerve function is symmetric. Oral Cavity: No mucosal abnormalities are noted. Tongue with normal mobility. Dentition in relatively poor repair. Oropharynx: Tonsils are asymmetric, left side is much larger than the right. By palpation there is no obvious tumor. There are no mucosal masses identified. Tongue base appears normal and healthy. Larynx/Hypopharynx: deferred Chest: Deferred Neck: No palpable masses, no cervical adenopathy, no thyroid  nodules or enlargement. Neuro: Cranial  nerves II-XII with normal function. Balance: Normal gate. Other findings: none.  Independent Review of Additional Tests or Records:  none  Procedures:  none  Impression & Plans:  1. Right side tympanic membrane perforation. No signs of infection. Recommend full audiometric evaluation.  2. Tonsil asymmetry. Recommend CT evaluation to rule out a neoplasm.

## 2024-01-21 ENCOUNTER — Encounter (HOSPITAL_BASED_OUTPATIENT_CLINIC_OR_DEPARTMENT_OTHER)
Admission: RE | Admit: 2024-01-21 | Discharge: 2024-01-21 | Disposition: A | Discharge status: Home / Self Care | Source: Ambulatory Visit | Attending: Otolaryngology | Admitting: Otolaryngology

## 2024-01-21 DIAGNOSIS — E119 Type 2 diabetes mellitus without complications: Secondary | ICD-10-CM | POA: Insufficient documentation

## 2024-01-21 DIAGNOSIS — Z01812 Encounter for preprocedural laboratory examination: Secondary | ICD-10-CM | POA: Diagnosis present

## 2024-01-21 DIAGNOSIS — I10 Essential (primary) hypertension: Secondary | ICD-10-CM | POA: Insufficient documentation

## 2024-01-21 LAB — BASIC METABOLIC PANEL WITH GFR
Anion gap: 10 (ref 5–15)
BUN: 12 mg/dL (ref 6–20)
CO2: 25 mmol/L (ref 22–32)
Calcium: 9 mg/dL (ref 8.9–10.3)
Chloride: 105 mmol/L (ref 98–111)
Creatinine, Ser: 0.88 mg/dL (ref 0.44–1.00)
GFR, Estimated: >60 mL/min (ref >60)
Glucose, Bld: 100 mg/dL — ABNORMAL HIGH (ref 70–99)
Potassium: 4.1 mmol/L (ref 3.5–5.1)
Sodium: 140 mmol/L (ref 135–145)

## 2024-01-24 ENCOUNTER — Ambulatory Visit (HOSPITAL_COMMUNITY)
Admission: RE | Admit: 2024-01-24 | Discharge: 2024-01-24 | Disposition: A | Discharge status: Home / Self Care | Source: Ambulatory Visit | Attending: Gastroenterology | Admitting: Gastroenterology

## 2024-01-24 DIAGNOSIS — K760 Fatty (change of) liver, not elsewhere classified: Secondary | ICD-10-CM | POA: Diagnosis present

## 2024-01-28 ENCOUNTER — Encounter (HOSPITAL_BASED_OUTPATIENT_CLINIC_OR_DEPARTMENT_OTHER)
Admission: RE | Disposition: A | Discharge status: Home / Self Care | Payer: Self-pay | Source: Home / Self Care | Attending: Otolaryngology

## 2024-01-28 ENCOUNTER — Encounter (HOSPITAL_BASED_OUTPATIENT_CLINIC_OR_DEPARTMENT_OTHER): Payer: Self-pay | Admitting: Otolaryngology

## 2024-01-28 ENCOUNTER — Ambulatory Visit (HOSPITAL_BASED_OUTPATIENT_CLINIC_OR_DEPARTMENT_OTHER)
Admission: RE | Admit: 2024-01-28 | Discharge: 2024-01-28 | Disposition: A | Discharge status: Home / Self Care | Attending: Otolaryngology | Admitting: Otolaryngology

## 2024-01-28 ENCOUNTER — Other Ambulatory Visit: Payer: Self-pay

## 2024-01-28 ENCOUNTER — Ambulatory Visit (HOSPITAL_BASED_OUTPATIENT_CLINIC_OR_DEPARTMENT_OTHER): Payer: Self-pay | Admitting: Anesthesiology

## 2024-01-28 DIAGNOSIS — E119 Type 2 diabetes mellitus without complications: Secondary | ICD-10-CM | POA: Insufficient documentation

## 2024-01-28 DIAGNOSIS — F418 Other specified anxiety disorders: Secondary | ICD-10-CM

## 2024-01-28 DIAGNOSIS — I10 Essential (primary) hypertension: Secondary | ICD-10-CM | POA: Diagnosis not present

## 2024-01-28 DIAGNOSIS — K219 Gastro-esophageal reflux disease without esophagitis: Secondary | ICD-10-CM | POA: Diagnosis present

## 2024-01-28 DIAGNOSIS — H7291 Unspecified perforation of tympanic membrane, right ear: Secondary | ICD-10-CM | POA: Insufficient documentation

## 2024-01-28 DIAGNOSIS — E039 Hypothyroidism, unspecified: Secondary | ICD-10-CM

## 2024-01-28 LAB — GLUCOSE, CAPILLARY: Glucose-Capillary: 111 mg/dL — ABNORMAL HIGH (ref 70–99)

## 2024-01-28 SURGERY — TYMPANOPLASTY
Anesthesia: General | Site: Ear | Laterality: Right

## 2024-01-28 MED ORDER — AMISULPRIDE (ANTIEMETIC) 5 MG/2ML IV SOLN: 10.0000 mg | Freq: Once | INTRAVENOUS | Status: DC | PRN

## 2024-01-28 MED ORDER — ROCURONIUM BROMIDE 10 MG/ML (PF) SYRINGE
PREFILLED_SYRINGE | INTRAVENOUS | Status: AC
Start: 2024-01-28 — End: 2024-01-28
  Filled 2024-01-28: qty 10

## 2024-01-28 MED ORDER — OXYCODONE HCL 5 MG/5ML PO SOLN: 5.0000 mg | Freq: Once | ORAL | Status: DC | PRN

## 2024-01-28 MED ORDER — HYDROMORPHONE HCL 1 MG/ML IJ SOLN: 0.2500 mg | INTRAMUSCULAR | Status: DC | PRN

## 2024-01-28 MED ORDER — FENTANYL CITRATE (PF) 100 MCG/2ML IJ SOLN
INTRAMUSCULAR | Status: DC | PRN
  Administered 2024-01-28 (×2): 50 ug via INTRAVENOUS

## 2024-01-28 MED ORDER — PROPOFOL 10 MG/ML IV BOLUS
INTRAVENOUS | Status: DC | PRN
  Administered 2024-01-28: 150 mg via INTRAVENOUS
  Administered 2024-01-28: 150 ug/kg/min via INTRAVENOUS

## 2024-01-28 MED ORDER — ACETAMINOPHEN 500 MG PO TABS
1000.0000 mg | ORAL_TABLET | Freq: Once | ORAL | Status: AC
  Administered 2024-01-28: 1000 mg via ORAL

## 2024-01-28 MED ORDER — PHENYLEPHRINE HCL (PRESSORS) 10 MG/ML IV SOLN
INTRAVENOUS | Status: DC | PRN
  Administered 2024-01-28 (×3): 80 ug via INTRAVENOUS

## 2024-01-28 MED ORDER — OXYCODONE HCL 5 MG PO TABS: 5.0000 mg | ORAL_TABLET | Freq: Once | ORAL | Status: DC | PRN

## 2024-01-28 MED ORDER — DEXAMETHASONE SODIUM PHOSPHATE 4 MG/ML IJ SOLN
INTRAMUSCULAR | Status: DC | PRN
  Administered 2024-01-28: 8 mg via INTRAVENOUS

## 2024-01-28 MED ORDER — LIDOCAINE-EPINEPHRINE 1 %-1:100000 IJ SOLN
INTRAMUSCULAR | Status: DC | PRN
  Administered 2024-01-28: 3 mL

## 2024-01-28 MED ORDER — LACTATED RINGERS IV SOLN: INTRAVENOUS | Status: DC

## 2024-01-28 MED ORDER — DEXAMETHASONE SODIUM PHOSPHATE 10 MG/ML IJ SOLN
INTRAMUSCULAR | Status: AC
  Filled 2024-01-28: qty 1

## 2024-01-28 MED ORDER — ONDANSETRON HCL 4 MG/2ML IJ SOLN
INTRAMUSCULAR | Status: DC | PRN
  Administered 2024-01-28: 4 mg via INTRAVENOUS

## 2024-01-28 MED ORDER — LIDOCAINE 2% (20 MG/ML) 5 ML SYRINGE
INTRAMUSCULAR | Status: AC
  Filled 2024-01-28: qty 5

## 2024-01-28 MED ORDER — PROPOFOL 500 MG/50ML IV EMUL
INTRAVENOUS | Status: AC
  Filled 2024-01-28: qty 50

## 2024-01-28 MED ORDER — ACETAMINOPHEN 500 MG PO TABS
ORAL_TABLET | ORAL | Status: AC
  Filled 2024-01-28: qty 2

## 2024-01-28 MED ORDER — MIDAZOLAM HCL 5 MG/5ML IJ SOLN
INTRAMUSCULAR | Status: DC | PRN
  Administered 2024-01-28: 2 mg via INTRAVENOUS

## 2024-01-28 MED ORDER — CIPROFLOXACIN-DEXAMETHASONE 0.3-0.1 % OT SUSP
OTIC | Status: DC | PRN
  Administered 2024-01-28: 4 [drp] via OTIC

## 2024-01-28 MED ORDER — PROPOFOL 10 MG/ML IV BOLUS
INTRAVENOUS | Status: AC
Start: 2024-01-28 — End: 2024-01-28
  Filled 2024-01-28: qty 20

## 2024-01-28 MED ORDER — MIDAZOLAM HCL 2 MG/2ML IJ SOLN
INTRAMUSCULAR | Status: AC
  Filled 2024-01-28: qty 2

## 2024-01-28 MED ORDER — ONDANSETRON HCL 4 MG/2ML IJ SOLN: 4.0000 mg | Freq: Once | INTRAMUSCULAR | Status: DC | PRN

## 2024-01-28 MED ORDER — LIDOCAINE HCL (CARDIAC) PF 100 MG/5ML IV SOSY
PREFILLED_SYRINGE | INTRAVENOUS | Status: DC | PRN
  Administered 2024-01-28: 80 mg via INTRAVENOUS

## 2024-01-28 MED ORDER — FENTANYL CITRATE (PF) 100 MCG/2ML IJ SOLN
INTRAMUSCULAR | Status: AC
  Filled 2024-01-28: qty 2

## 2024-01-28 MED ORDER — BACITRACIN ZINC 500 UNIT/GM EX OINT
TOPICAL_OINTMENT | CUTANEOUS | Status: DC | PRN
  Administered 2024-01-28: 1 via TOPICAL

## 2024-01-28 MED ORDER — ONDANSETRON HCL 4 MG/2ML IJ SOLN
INTRAMUSCULAR | Status: AC
  Filled 2024-01-28: qty 2

## 2024-01-28 MED ORDER — PHENYLEPHRINE 80 MCG/ML (10ML) SYRINGE FOR IV PUSH (FOR BLOOD PRESSURE SUPPORT)
PREFILLED_SYRINGE | INTRAVENOUS | Status: AC
  Filled 2024-01-28: qty 10

## 2024-01-28 MED ORDER — PROPOFOL 10 MG/ML IV BOLUS
INTRAVENOUS | Status: AC
  Filled 2024-01-28: qty 20

## 2024-01-28 MED ORDER — 0.9 % SODIUM CHLORIDE (POUR BTL) OPTIME
TOPICAL | Status: DC | PRN
  Administered 2024-01-28: 1000 mL

## 2024-01-28 SURGICAL SUPPLY — 52 items
BENZOIN TINCTURE PRP APPL 2/3 (GAUZE/BANDAGES/DRESSINGS) IMPLANT
BLADE CLIPPER SURG (BLADE) IMPLANT
BLADE NDL 3 SS STRL (BLADE) IMPLANT
BLADE NEEDLE 3 SS STRL (BLADE) IMPLANT
BNDG GAUZE DERMACEA FLUFF 4 (GAUZE/BANDAGES/DRESSINGS) IMPLANT
BNDG STRETCH GAUZE 3IN X12FT (GAUZE/BANDAGES/DRESSINGS) IMPLANT
CANISTER SUCT 1200ML W/VALVE (MISCELLANEOUS) ×1 IMPLANT
CLEANER CAUTERY TIP PAD (MISCELLANEOUS) ×1 IMPLANT
COTTONBALL LRG STERILE PKG (GAUZE/BANDAGES/DRESSINGS) ×1 IMPLANT
DERMABOND ADVANCED .7 DNX12 (GAUZE/BANDAGES/DRESSINGS) ×1 IMPLANT
DRAPE EENT STERILE 33X51 (DRAPES) IMPLANT
DRAPE INCISE 23X17 STRL (DRAPES) IMPLANT
DRAPE MICROSCOPE URBAN (DRAPES) ×1 IMPLANT
DRAPE MICROSCOPE WILD 40.5X102 (DRAPES) IMPLANT
DROPPER MEDICINE STER 1.5ML LF (MISCELLANEOUS) IMPLANT
DRSG GLASSCOCK MASTOID ADT (GAUZE/BANDAGES/DRESSINGS) IMPLANT
DRSG GLASSCOCK MASTOID PED (GAUZE/BANDAGES/DRESSINGS) IMPLANT
DRSG TELFA 3X8 NADH STRL (GAUZE/BANDAGES/DRESSINGS) IMPLANT
ELECT COATED BLADE 2.86 ST (ELECTRODE) ×1 IMPLANT
ELECTRODE REM PT RTRN 9FT ADLT (ELECTROSURGICAL) ×1 IMPLANT
GAUZE 4X4 16PLY ~~LOC~~+RFID DBL (SPONGE) IMPLANT
GAUZE SPONGE 4X4 12PLY STRL (GAUZE/BANDAGES/DRESSINGS) IMPLANT
GAUZE SPONGE 4X4 12PLY STRL LF (GAUZE/BANDAGES/DRESSINGS) IMPLANT
GLOVE BIOGEL PI IND STRL 7.0 (GLOVE) IMPLANT
GLOVE ECLIPSE 7.5 STRL STRAW (GLOVE) ×1 IMPLANT
GLOVE SURG SS PI 6.5 STRL IVOR (GLOVE) IMPLANT
GOWN STRL REUS W/ TWL LRG LVL3 (GOWN DISPOSABLE) ×1 IMPLANT
GOWN STRL REUS W/ TWL XL LVL3 (GOWN DISPOSABLE) ×1 IMPLANT
IV CATH AUTO 14GX1.75 SAFE ORG (IV SOLUTION) IMPLANT
IV SET EXT 30 76VOL 4 MALE LL (IV SETS) ×1 IMPLANT
NDL PRECISIONGLIDE 27X1.5 (NEEDLE) ×1 IMPLANT
NDL SAFETY ECLIPSE 18X1.5 (NEEDLE) ×1 IMPLANT
NEEDLE PRECISIONGLIDE 27X1.5 (NEEDLE) ×1 IMPLANT
NS IRRIG 1000ML POUR BTL (IV SOLUTION) ×1 IMPLANT
PACK BASIN DAY SURGERY FS (CUSTOM PROCEDURE TRAY) ×1 IMPLANT
PACK ENT DAY SURGERY (CUSTOM PROCEDURE TRAY) ×1 IMPLANT
PENCIL FOOT CONTROL (ELECTRODE) ×1 IMPLANT
SHEET MEDIUM DRAPE 40X70 STRL (DRAPES) IMPLANT
SLEEVE SCD COMPRESS KNEE MED (STOCKING) IMPLANT
SPIKE FLUID TRANSFER (MISCELLANEOUS) ×1 IMPLANT
SPONGE SURGIFOAM ABS GEL 12-7 (HEMOSTASIS) ×1 IMPLANT
STRIP CLOSURE SKIN 1/2X4 (GAUZE/BANDAGES/DRESSINGS) IMPLANT
SUT CHROMIC 3 0 PS 2 (SUTURE) IMPLANT
SUT CHROMIC 4 0 P 3 18 (SUTURE) IMPLANT
SUT NYLON ETHILON 5-0 P-3 1X18 (SUTURE) IMPLANT
SUT VIC AB 3-0 FS2 27 (SUTURE) IMPLANT
SUT VICRYL RAPIDE 4-0 (SUTURE) IMPLANT
SYR 5ML LL (SYRINGE) IMPLANT
SYR BULB EAR ULCER 3OZ GRN STR (SYRINGE) IMPLANT
TOWEL GREEN STERILE FF (TOWEL DISPOSABLE) ×1 IMPLANT
TRAY DSU PREP LF (CUSTOM PROCEDURE TRAY) ×1 IMPLANT
TUBING IRRIGATION (MISCELLANEOUS) IMPLANT

## 2024-01-28 NOTE — Discharge Instructions (Addendum)
 On Tuesday you may remove the dressing.  Undo the Velcro strap and the entire dressing comes off.  There is an adhesive pad on the forehead which can be pulled off.  There is a thin dressing behind the ear which can be removed.  There is a cottonball in the ear which can be removed and then place the eardrops and then a fresh cottonball.  Repeat the drops and cottonball twice daily.  Avoid heavy lifting, straining, blowing the nose, have to sneeze make sure your mouth is open.     Post Anesthesia Home Care Instructions  Activity: Get plenty of rest for the remainder of the day. A responsible individual must stay with you for 24 hours following the procedure.  For the next 24 hours, DO NOT: -Drive a car -Advertising copywriter -Drink alcoholic beverages -Take any medication unless instructed by your physician -Make any legal decisions or sign important papers.  Meals: Start with liquid foods such as gelatin or soup. Progress to regular foods as tolerated. Avoid greasy, spicy, heavy foods. If nausea and/or vomiting occur, drink only clear liquids until the nausea and/or vomiting subsides. Call your physician if vomiting continues.  Special Instructions/Symptoms: Your throat may feel dry or sore from the anesthesia or the breathing tube placed in your throat during surgery. If this causes discomfort, gargle with warm salt water . The discomfort should disappear within 24 hours.

## 2024-01-28 NOTE — Op Note (Signed)
 OPERATIVE REPORT  DATE OF SURGERY: 01/28/2024  PATIENT:  Dawn Barnes,  58 y.o. female  PRE-OPERATIVE DIAGNOSIS:  Tympanic membrane perforation, right  POST-OPERATIVE DIAGNOSIS:  Tympanic membrane perforation, right  PROCEDURE:  Procedure(s): TYMPANOPLASTY, right  SURGEON:  Ida VEAR Loader, MD  ASSISTANTS: None  ANESTHESIA:   General   EBL: 30 ml  DRAINS: None  LOCAL MEDICATIONS USED: 1% Xylocaine  with epinephrine   SPECIMEN:  none  COUNTS:  Correct  PROCEDURE DETAILS: The patient was taken to the operating room and placed on the operating table in the supine position. Following induction of general endotracheal anesthesia, the right ear was prepped and draped in standard fashion.  Local anesthetic was infiltrated into the postauricular sulcus and the external auditory canal in 4 quadrants.  A partial postauricular incision was created and used to harvest a graft of loose areolar tissue lateral to the temporalis fascia.  This was then pressed and dried on the back table.  The incision was reapproximated using running subcuticular 3-0 chromic suture and Dermabond on the skin.  The perforation was identified and was found to be approximately 25% in the posterior mid portion of the drum.  Edges were freshened using a sharp pick and cup forceps.  There is no middle ear disease present.  The ossicular chain was intact.  A posterior tympanomeatal flap was then developed and brought forward exposing the middle ear.  Chorda tympani nerve was intact.  Middle ear was packed with saline soaked Gelfoam pieces including the eustachian tube orifice.  The dried graft was then trimmed and notched for the malleus and placed in an underlay position.  Additional packing was placed in the middle ear.  After there was good positioning of the drum and the graft the ear canal was packed with Ciprodex soaked Gelfoam.  A Glasscock dressing was applied.  Patient was awakened extubated and transferred to recovery  in stable condition.    PATIENT DISPOSITION:  To PACU, stable

## 2024-01-28 NOTE — Interval H&P Note (Signed)
 History and Physical Interval Note:  01/28/2024 7:49 AM  Dawn Barnes  has presented today for surgery, with the diagnosis of Hole in the ear drum, right Laryngopharyngeal reflux.  The various methods of treatment have been discussed with the patient and family. After consideration of risks, benefits and other options for treatment, the patient has consented to  Procedure(s): TYMPANOPLASTY (Right) as a surgical intervention.  The patient's history has been reviewed, patient examined, no change in status, stable for surgery.  I have reviewed the patient's chart and labs.  Questions were answered to the patient's satisfaction.     Ida Loader

## 2024-01-28 NOTE — Anesthesia Procedure Notes (Signed)
 Procedure Name: LMA Insertion Date/Time: 01/28/2024 8:27 AM  Performed by: Buster Catheryn SAUNDERS, CRNAPre-anesthesia Checklist: Patient identified, Emergency Drugs available, Suction available and Patient being monitored Patient Re-evaluated:Patient Re-evaluated prior to induction Oxygen Delivery Method: Circle system utilized Preoxygenation: Pre-oxygenation with 100% oxygen Induction Type: IV induction Ventilation: Mask ventilation without difficulty LMA: LMA inserted LMA Size: 4.0 Number of attempts: 1 Placement Confirmation: positive ETCO2 Tube secured with: Tape Dental Injury: Teeth and Oropharynx as per pre-operative assessment

## 2024-01-28 NOTE — Transfer of Care (Signed)
 Immediate Anesthesia Transfer of Care Note  Patient: Dawn Barnes  Procedure(s) Performed: TYMPANOPLASTY (Right)  Patient Location: PACU  Anesthesia Type:General  Level of Consciousness: awake, alert , and oriented  Airway & Oxygen Therapy: Patient Spontanous Breathing and Patient connected to face mask oxygen  Post-op Assessment: Report given to RN and Post -op Vital signs reviewed and stable  Post vital signs: Reviewed and stable  Last Vitals:  Vitals Value Taken Time  BP    Temp    Pulse    Resp 22 01/28/24 09:31  SpO2    Vitals shown include unfiled device data.  Last Pain:  Vitals:   01/28/24 0723  TempSrc: Temporal  PainSc: 0-No pain      Patients Stated Pain Goal: 5 (01/28/24 0723)  Complications: No notable events documented.

## 2024-01-28 NOTE — Anesthesia Postprocedure Evaluation (Signed)
 Anesthesia Post Note  Patient: Dawn Barnes  Procedure(s) Performed: TYMPANOPLASTY (Right: Ear)     Patient location during evaluation: PACU Anesthesia Type: General Level of consciousness: awake and alert, oriented and patient cooperative Pain management: pain level controlled Vital Signs Assessment: post-procedure vital signs reviewed and stable Respiratory status: spontaneous breathing, nonlabored ventilation and respiratory function stable Cardiovascular status: blood pressure returned to baseline and stable Postop Assessment: no apparent nausea or vomiting Anesthetic complications: no   No notable events documented.  Last Vitals:  Vitals:   01/28/24 0930 01/28/24 0945  BP: 109/84 117/84  Pulse:  61  Resp: (!) 22 15  Temp:    SpO2: 95% 99%    Last Pain:  Vitals:   01/28/24 0945  TempSrc:   PainSc: 0-No pain                 Dawn Barnes

## 2024-01-28 NOTE — Interval H&P Note (Signed)
 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  The various methods of treatment have been discussed with the patient and family. After consideration of risks, benefits and other options for treatment, the patient has consented to  Procedure(s): TYMPANOPLASTY (Right) as a surgical intervention.  The patient's history has been reviewed, patient examined, no change in status, stable for surgery.  I have reviewed the patient's chart and labs.  Questions were answered to the patient's satisfaction.     Ida Loader

## 2024-01-29 ENCOUNTER — Encounter (HOSPITAL_BASED_OUTPATIENT_CLINIC_OR_DEPARTMENT_OTHER): Payer: Self-pay | Admitting: Otolaryngology

## 2024-02-03 ENCOUNTER — Other Ambulatory Visit: Payer: Self-pay | Admitting: Nurse Practitioner

## 2024-02-03 DIAGNOSIS — E038 Other specified hypothyroidism: Secondary | ICD-10-CM

## 2024-02-04 ENCOUNTER — Other Ambulatory Visit: Payer: Self-pay

## 2024-02-04 DIAGNOSIS — E559 Vitamin D deficiency, unspecified: Secondary | ICD-10-CM

## 2024-02-04 DIAGNOSIS — E038 Other specified hypothyroidism: Secondary | ICD-10-CM

## 2024-02-05 LAB — T4, FREE: Free T4: 1.23 ng/dL (ref 0.82–1.77)

## 2024-02-05 LAB — VITAMIN D 25 HYDROXY (VIT D DEFICIENCY, FRACTURES): Vit D, 25-Hydroxy: 69 ng/mL (ref 30.0–100.0)

## 2024-02-05 LAB — TSH: TSH: 3.25 u[IU]/mL (ref 0.450–4.500)

## 2024-02-06 ENCOUNTER — Ambulatory Visit: Payer: Self-pay | Admitting: Gastroenterology

## 2024-02-11 ENCOUNTER — Other Ambulatory Visit (HOSPITAL_COMMUNITY)
Admission: RE | Admit: 2024-02-11 | Discharge: 2024-02-11 | Disposition: A | Discharge status: Home / Self Care | Source: Ambulatory Visit | Attending: Internal Medicine | Admitting: Internal Medicine

## 2024-02-11 DIAGNOSIS — D649 Anemia, unspecified: Secondary | ICD-10-CM | POA: Diagnosis present

## 2024-02-11 LAB — BASIC METABOLIC PANEL WITH GFR
Anion gap: 13 (ref 5–15)
BUN: 10 mg/dL (ref 6–20)
CO2: 25 mmol/L (ref 22–32)
Calcium: 9.3 mg/dL (ref 8.9–10.3)
Chloride: 101 mmol/L (ref 98–111)
Creatinine, Ser: 1.03 mg/dL — ABNORMAL HIGH (ref 0.44–1.00)
GFR, Estimated: >60 mL/min (ref >60)
Glucose, Bld: 107 mg/dL — ABNORMAL HIGH (ref 70–99)
Potassium: 3.4 mmol/L — ABNORMAL LOW (ref 3.5–5.1)
Sodium: 139 mmol/L (ref 135–145)

## 2024-02-11 NOTE — Patient Instructions (Signed)

## 2024-02-12 ENCOUNTER — Other Ambulatory Visit: Payer: Self-pay | Admitting: Nurse Practitioner

## 2024-02-12 ENCOUNTER — Ambulatory Visit (INDEPENDENT_AMBULATORY_CARE_PROVIDER_SITE_OTHER): Payer: Medicaid Other | Admitting: Nurse Practitioner

## 2024-02-12 ENCOUNTER — Encounter: Payer: Self-pay | Admitting: Nurse Practitioner

## 2024-02-12 VITALS — BP 110/80 | HR 84 | Ht 64.0 in | Wt 205.4 lb

## 2024-02-12 DIAGNOSIS — E559 Vitamin D deficiency, unspecified: Secondary | ICD-10-CM | POA: Diagnosis not present

## 2024-02-12 DIAGNOSIS — E038 Other specified hypothyroidism: Secondary | ICD-10-CM

## 2024-02-12 MED ORDER — LEVOTHYROXINE SODIUM 75 MCG PO TABS: 75.0000 ug | ORAL_TABLET | Freq: Every day | ORAL | 3 refills | Status: AC

## 2024-02-12 NOTE — Progress Notes (Signed)
 02/12/2024  Endocrinology follow-up note    Subjective:    Patient ID: Dawn Barnes, female    DOB: Apr 22, 1966, PCP Carlette Benita Area, MD   Past Medical History:  Diagnosis Date   Anemia    Anxiety    Constipation 04/30/2013   Depression    Diabetes mellitus without complication (HCC)    borderline   Fibroids, intramural 01/21/2016   GERD (gastroesophageal reflux disease)    Heartburn    Hot flashes 01/11/2016   Hypertension    Hypothyroidism    Mixed incontinence    Pancreatitis    PONV (postoperative nausea and vomiting)    Pre-diabetes    PTSD (post-traumatic stress disorder)    Thyroid  disease    Trauma    Urge urinary incontinence 05/10/2015   Vaginal bleeding 01/11/2016   Past Surgical History:  Procedure Laterality Date   BALLOON DILATION N/A 10/22/2023   Procedure: BALLOON DILATION;  Surgeon: [name] [name], DO;  [facility];  Service: Endoscopy;  Laterality: N/A;   BIOPSY  12/07/2015   Procedure: BIOPSY;  Surgeon: Margo LITTIE Haddock, MD;  [facility];  Service: Endoscopy;;  right and left colon biopsies, gastric bx's    BLADDER REPAIR     COLONOSCOPY WITH PROPOFOL  N/A 12/07/2015   Dr. Haddock: mild proctitis, hyperplastic polyp    ENDOMETRIAL ABLATION     ESOPHAGOGASTRODUODENOSCOPY (EGD) WITH PROPOFOL  N/A 12/07/2015   Dr. 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    ESOPHAGOGASTRODUODENOSCOPY (EGD) WITH PROPOFOL  N/A 10/22/2023   Procedure: ESOPHAGOGASTRODUODENOSCOPY (EGD) WITH PROPOFOL ;  Surgeon: [name] [name], DO;  [facility];  Service: Endoscopy;  Laterality: N/A;  11:30 AM, ASA 3   INCONTINENCE SURGERY     KNEE ARTHROSCOPY WITH MEDIAL MENISECTOMY Left 11/17/2021   Procedure: KNEE ARTHROSCOPY WITH MEDIAL MENISECTOMY;  Surgeon: Onesimo Oneil LABOR, MD;  Location: [facility];  Service: Orthopedics;  Laterality: Left;   MULTIPLE TOOTH EXTRACTIONS     SAVORY DILATION  N/A 12/07/2015   Procedure: SAVORY DILATION;  Surgeon: Margo LITTIE Haddock, MD;  [facility];  Service: Endoscopy;  Laterality: N/A;   TUBAL LIGATION     TYMPANOPLASTY Right 01/28/2024   Procedure: TYMPANOPLASTY;  Surgeon: Jesus Oliphant, MD;  Location: [facility];  Service: ENT;  Laterality: Right;   Social History   Socioeconomic History   Marital status: Divorced    Spouse name: Not on file   Number of children: Not on file   Years of education: Not on file   Highest education level: Not on file  Occupational History   Occupation: disability  Tobacco Use   Smoking status: Never   Smokeless tobacco: Never  Vaping Use   Vaping status: Never Used  Substance and Sexual Activity   Alcohol use: No    Alcohol/week: 0.0 standard drinks of alcohol   Drug use: No   Sexual activity: Not Currently    Birth control/protection: Surgical    Comment: tubal and ablation  Other Topics Concern   Not on file  Social History Narrative   Are you right handed or left handed?    Are you currently employed ?    What is your current occupation?   Do you live at home alone?   Who lives with you?    What type of home do you live in: 1 story or 2 story? 1       Social Drivers of  Health   Financial Resource Strain: Low Risk  (11/02/2022)   Overall Financial Resource Strain (CARDIA)    Difficulty of Paying Living Expenses: Not hard at all  Food Insecurity: Low Risk  (05/01/2023)   Received from [facility]   Hunger Vital Sign    Within the past 12 months, you worried that your food would run out before you got money to buy more: Never true    Within the past 12 months, the food you bought just didn't last and you didn't have money to get more. : Never true  Transportation Needs: No Transportation Needs (05/01/2023)   Received from [facility]    In the past 12 months, has lack of reliable transportation kept you from medical appointments, meetings, work or  from getting things needed for daily living? : No  Physical Activity: Inactive (11/02/2022)   Exercise Vital Sign    Days of Exercise per Week: 0 days    Minutes of Exercise per Session: 10 min  Stress: Stress Concern Present (11/02/2022)   [employer] of Occupational Health - Occupational Stress Questionnaire    Feeling of Stress : To some extent  Social Connections: Socially Isolated (11/02/2022)   Social Connection and Isolation Panel    Frequency of Communication with Friends and Family: Three times a week    Frequency of Social Gatherings with Friends and Family: Once a week    Attends Religious Services: Never    Database administrator or Organizations: No    Attends Banker Meetings: Never    Marital Status: Divorced   Outpatient Encounter Medications as of 02/12/2024  Medication Sig   cetirizine (ZYRTEC) 10 MG tablet Take 10 mg by mouth daily.   Cholecalciferol (VITAMIN D -3) 125 MCG (5000 UT) TABS Take 5,000 Units by mouth daily.   cyanocobalamin  (VITAMIN B12) 1000 MCG tablet Take 1,000 mcg by mouth daily.   escitalopram (LEXAPRO) 20 MG tablet Take 20 mg by mouth daily.   hydrochlorothiazide (HYDRODIURIL) 12.5 MG tablet Take 12.5 mg by mouth daily.   lubiprostone  (AMITIZA ) 24 MCG capsule TAKE ONE CAPSULE BY MOUTH TWICE DAILY WITH MEALS   nitrofurantoin , macrocrystal-monohydrate, (MACROBID ) 100 MG capsule Take 1 capsule (100 mg total) by mouth 2 (two) times daily.   omeprazole (PRILOSEC) 20 MG capsule Take 40 mg by mouth daily.   polyethylene glycol (MIRALAX / GLYCOLAX) 17 g packet Take 17 g by mouth daily as needed for moderate constipation.   sulfamethoxazole -trimethoprim  (BACTRIM  DS) 800-160 MG tablet Take 1 tablet by mouth 2 (two) times daily.   TOVIAZ 8 MG TB24 tablet Take 8 mg by mouth daily.   [DISCONTINUED] levothyroxine  (SYNTHROID ) 75 MCG tablet Take 1 tablet (75 mcg total) by mouth daily before breakfast.   levothyroxine  (SYNTHROID ) 75 MCG tablet Take 1  tablet (75 mcg total) by mouth daily before breakfast.   No facility-administered encounter medications on file as of 02/12/2024.   ALLERGIES: Allergies  Allergen Reactions   Ibuprofen Other (See Comments)   Nsaids Other (See Comments)    Pt states MD told her not to take due to stomach irritation   Tuberculin Tests Dermatitis   VACCINATION STATUS: Immunization History  Administered Date(s) Administered   Influenza-Unspecified 04/30/2014, 03/15/2015    Thyroid  Problem Presents for follow-up (patient with medical history of hypothyroidism from Hashimoto's thyroiditis. ) visit. Symptoms include fatigue. Patient reports no anxiety, cold intolerance, constipation, depressed mood, diarrhea, heat intolerance, palpitations, tremors, weight gain or weight loss. The  symptoms have been stable.    She also has history of prediabetes not on medications.  She is also on ongoing supplement for vitamin D  deficiency.   Review of systems  Constitutional: + steadily decreasing body weight,  current Body mass index is 35.26 kg/m. , + intermittent fatigue, mild subjective hyperthermia, no subjective hypothermia Eyes: no blurry vision, no xerophthalmia ENT: no sore throat, no nodules palpated in throat, no dysphagia/odynophagia, no hoarseness, right ear- recently had surgery to fix hole in ear drum Cardiovascular: no chest pain, no shortness of breath, no palpitations, no leg swelling Respiratory: no cough, no shortness of breath Gastrointestinal: no nausea/vomiting/diarrhea Musculoskeletal: no muscle/joint aches, recently had her diagnosis of MS reversed (ended up being b12 deficiency) Skin: no rashes, no hyperemia Neurological: mild tremors, + numbness/tingling to neck and arm (being treated for B12 deficiency), no dizziness Psychiatric: no depression, no anxiety  Objective:    BP 110/80 (BP Location: Left Arm, Patient Position: Sitting, Cuff Size: Large)   Pulse 84   Ht 5' 4 (1.626 m)   Wt  205 lb 6.4 oz (93.2 kg)   BMI 35.26 kg/m   Wt Readings from Last 3 Encounters:  02/12/24 205 lb 6.4 oz (93.2 kg)  01/28/24 206 lb 12.7 oz (93.8 kg)  12/25/23 209 lb 9.6 oz (95.1 kg)     BP Readings from Last 3 Encounters:  02/12/24 110/80  01/28/24 139/77  12/26/23 121/84     Physical Exam- Limited  Constitutional:  Body mass index is 35.26 kg/m. , not in acute distress, normal state of mind Eyes:  EOMI, no exophthalmos Musculoskeletal: no gross deformities, strength intact in all four extremities, no gross restriction of joint movements Skin:  no rashes, no hyperemia Neurological: no tremor with outstretched hands   Recent Results (from the past 2160 hours)  Hemoglobin A1c     Status: None   Collection Time: 11/21/23  2:47 PM  Result Value Ref Range   Hgb A1c MFr Bld 5.3 4.8 - 5.6 %    Comment: (NOTE) Pre diabetes:          5.7%-6.4%  Diabetes:              >6.4%  Glycemic control for   <7.0% adults with diabetes    Mean Plasma Glucose 105.41 mg/dL    Comment: Performed at [facility], [address], [place], [place] [address]  Glucose, capillary     Status: None   Collection Time: 11/26/23  6:33 AM  Result Value Ref Range   Glucose-Capillary 78 70 - 99 mg/dL    Comment: Glucose reference range applies only to samples taken after fasting for at least 8 hours.  Surgical pathology     Status: None   Collection Time: 11/26/23  8:26 AM  Result Value Ref Range   SURGICAL PATHOLOGY      SURGICAL PATHOLOGY CASE: [id number] PATIENT: Dawn Barnes Meda Surgical Pathology Report     Clinical History: Cholelithiasis (jlr)     FINAL MICROSCOPIC DIAGNOSIS:  A. GALLBLADDER, CHOLECYSTECTOMY: - Chronic cholecystitis and cholelithiasis     GROSS DESCRIPTION:  Size/?Intact: Received fresh is an intact gallbladder measuring 17 x 4.2 x 3.8 cm Serosal surface: Pink-tan and smooth to minimally roughened Mucosa/Wall: The mucosa is green and trabeculated,  without distinct lesions, and with a thickness of 0.1 cm Contents: Green viscous bile and multiple yellow-green, smooth, geometric calculi ranging from 1.0 to 2.5 cm. Cystic duct: Obstructed by the previously mentioned calculi Block  Summary: Representative wall and the cystic duct margin are submitted in 1 block. MARYSUE, 11/26/2023)     Final Diagnosis performed by Katrine Muskrat, MD.   Electronically signed 11/27/2023 Technical component performed at [facility], [address] [place], [place] [address].  Professional component performed at [facility], [address], [place], [place] [address].  Immunohistochemistry Technical component (if applicable) was performed at [facility] [address], [place], [place] [address].   IMMUNOHISTOCHEMISTRY DISCLAIMER (if applicable): Some of these immunohistochemical stains may have been developed and the performance characteristics determine by [facility]. Some may not have been cleared or approved by the U.S. Food and Drug Administration. The FDA has determined that such clearance or approval is not necessary. This test is used for clinical purposes. It should not be regarded as investigational or for research. This laboratory is certified under the Clinical Laboratory Improvement Amendments of 1988 (CLIA-88) as qualified to perform high complexity clinical laboratory testing.  The controls stained appropriately.   IHC stains are performed o n formalin fixed, paraffin embedded tissue using a 3,3diaminobenzidine (DAB) chromogen and Leica Bond Autostainer System. The staining intensity of the nucleus is score manually and is reported as the percentage of tumor cell nuclei demonstrating specific nuclear staining. The specimens are fixed in 10% Neutral Formalin for at least 6 hours and up to 72hrs. These tests are validated on decalcified tissue.  Results should be interpreted with caution given the possibility of false negative results on decalcified specimens. Antibody Clones are as follows ER-clone 36F, PR-clone 16, Ki67- clone MM1. Some of these immunohistochemical stains may have been developed and the performance characteristics determined by [facility].   Glucose, capillary     Status: Abnormal   Collection Time: 11/26/23  9:28 AM  Result Value Ref Range   Glucose-Capillary 135 (H) 70 - 99 mg/dL    Comment: Glucose reference range applies only to samples taken after fasting for at least 8 hours.  Hepatic function panel     Status: None   Collection Time: 12/25/23  3:17 PM  Result Value Ref Range   Total Protein 7.1 6.0 - 8.5 g/dL   Albumin 4.2 3.8 - 4.9 g/dL   Bilirubin Total <9.7 0.0 - 1.2 mg/dL   Bilirubin, Direct 9.91 0.00 - 0.40 mg/dL   Alkaline Phosphatase 95 44 - 121 IU/L   AST 19 0 - 40 IU/L   ALT 15 0 - 32 IU/L  Enhanced Liver Fibrosis (ELF)     Status: None   Collection Time: 12/25/23  3:17 PM  Result Value Ref Range   ELF(TM) Score 9.32 <9.80    Comment: ELF(TM) Score Interpretation: Risk cut-offs to assess the likelihood of progression to cirrhosis and liver-related clinical events within 3.9 years following baseline ELF score (IQR: 14.0-22.4 months)*:                           Lower risk             < 9.80                           Mid risk         9.80 - 11.29                           Higher risk            >  11.29 Note: The ELF(TM) Score is a unitless numerical value. Ponce SA, Wong VW, [place] T, et al. Selonsertib for patients with bridging fibrosis or compensated cirrhosis due to NASH: Results from randomized phase III STELLAR trials. J Hepatol. 2020 Jul;73(1):26-39.   CBC w/Diff/Platelet     Status: None   Collection Time: 12/25/23  3:17 PM  Result Value Ref Range   WBC 6.2 3.4 - 10.8 x10E3/uL   RBC 4.28 3.77 - 5.28 x10E6/uL   Hemoglobin 12.5 11.1 - 15.9 g/dL   Hematocrit 60.6  65.9 - 46.6 %   MCV 92 79 - 97 fL   MCH 29.2 26.6 - 33.0 pg   MCHC 31.8 31.5 - 35.7 g/dL   RDW 86.7 88.2 - 84.5 %   Platelets 287 150 - 450 x10E3/uL   Neutrophils 62 Not Estab. %   Lymphs 27 Not Estab. %   Monocytes 7 Not Estab. %   Eos 2 Not Estab. %   Basos 1 Not Estab. %   Neutrophils Absolute 3.9 1.4 - 7.0 x10E3/uL   Lymphocytes Absolute 1.7 0.7 - 3.1 x10E3/uL   Monocytes Absolute 0.4 0.1 - 0.9 x10E3/uL   EOS (ABSOLUTE) 0.1 0.0 - 0.4 x10E3/uL   Basophils Absolute 0.1 0.0 - 0.2 x10E3/uL   Immature Granulocytes 1 Not Estab. %   Immature Grans (Abs) 0.0 0.0 - 0.1 x10E3/uL  Iron, TIBC and Ferritin Panel     Status: Abnormal   Collection Time: 12/25/23  3:17 PM  Result Value Ref Range   Total Iron Binding Capacity 412 250 - 450 ug/dL   UIBC 640 868 - 574 ug/dL   Iron 53 27 - 840 ug/dL   Iron Saturation 13 (L) 15 - 55 %   Ferritin 21 15 - 150 ng/mL  POCT urinalysis dipstick     Status: Abnormal   Collection Time: 12/26/23  3:01 PM  Result Value Ref Range   Color, UA yellow yellow   Clarity, UA cloudy (A) clear   Glucose, UA negative negative mg/dL   Bilirubin, UA negative negative   Ketones, POC UA negative negative mg/dL   Spec Grav, UA 8.979 8.989 - 1.025   Blood, UA trace-lysed (A) negative   pH, UA 6.0 5.0 - 8.0   Protein Ur, POC negative negative mg/dL   Urobilinogen, UA 0.2 0.2 or 1.0 E.U./dL   Nitrite, UA Negative Negative   Leukocytes, UA Small (1+) (A) Negative  Urine Culture     Status: Abnormal   Collection Time: 12/26/23  3:11 PM   Specimen: Urine, Clean Catch  Result Value Ref Range   Specimen Description      URINE, CLEAN CATCH Performed at [facility], [address], [place], [place] [address]    Special Requests      NONE Performed at [facility], [address], [place], [place] [address]    Culture (A)     30,000 COLONIES/mL ESCHERICHIA COLI >=100,000 COLONIES/mL STAPHYLOCOCCUS EPIDERMIDIS 40,000 COLONIES/mL GROUP B  STREP(S.AGALACTIAE)ISOLATED TESTING AGAINST S. AGALACTIAE NOT ROUTINELY PERFORMED DUE TO PREDICTABILITY OF AMP/PEN/VAN SUSCEPTIBILITY. Performed at [facility], [address], [place], [place] [address]    Report Status 12/28/2023 FINAL    Organism ID, Bacteria STAPHYLOCOCCUS EPIDERMIDIS (A)    Organism ID, Bacteria ESCHERICHIA COLI (A)       Susceptibility   Escherichia coli - MIC*    AMPICILLIN >=32 RESISTANT Resistant     CEFAZOLIN  8 SENSITIVE Sensitive     CEFEPIME <=0.12 SENSITIVE Sensitive  CEFTRIAXONE <=0.25 SENSITIVE Sensitive     CIPROFLOXACIN  <=0.25 SENSITIVE Sensitive     GENTAMICIN <=1 SENSITIVE Sensitive     IMIPENEM <=0.25 SENSITIVE Sensitive     NITROFURANTOIN  32 SENSITIVE Sensitive     TRIMETH /SULFA  <=20 SENSITIVE Sensitive     AMPICILLIN/SULBACTAM >=32 RESISTANT Resistant     PIP/TAZO 8 SENSITIVE Sensitive ug/mL    * 30,000 COLONIES/mL ESCHERICHIA COLI   Staphylococcus epidermidis - MIC*    CIPROFLOXACIN  >=8 RESISTANT Resistant     GENTAMICIN <=0.5 SENSITIVE Sensitive     NITROFURANTOIN  <=16 SENSITIVE Sensitive     OXACILLIN >=4 RESISTANT Resistant     TETRACYCLINE <=1 SENSITIVE Sensitive     VANCOMYCIN 2 SENSITIVE Sensitive     TRIMETH /SULFA  >=320 RESISTANT Resistant     RIFAMPIN <=0.5 SENSITIVE Sensitive     Inducible Clindamycin NEGATIVE Sensitive     * >=100,000 COLONIES/mL STAPHYLOCOCCUS EPIDERMIDIS  Basic metabolic panel per protocol     Status: Abnormal   Collection Time: 01/21/24  1:30 PM  Result Value Ref Range   Sodium 140 135 - 145 mmol/L   Potassium 4.1 3.5 - 5.1 mmol/L   Chloride 105 98 - 111 mmol/L   CO2 25 22 - 32 mmol/L   Glucose, Bld 100 (H) 70 - 99 mg/dL    Comment: Glucose reference range applies only to samples taken after fasting for at least 8 hours.   BUN 12 6 - 20 mg/dL   Creatinine, Ser 9.11 0.44 - 1.00 mg/dL   Calcium 9.0 8.9 - 89.6 mg/dL   GFR, Estimated >39 >39 mL/min    Comment: (NOTE) Calculated using the CKD-EPI  Creatinine Equation (2021)    Anion gap 10 5 - 15    Comment: Performed at [facility], [address] [place], [place] [address]  Glucose, capillary     Status: Abnormal   Collection Time: 01/28/24  7:23 AM  Result Value Ref Range   Glucose-Capillary 111 (H) 70 - 99 mg/dL    Comment: Glucose reference range applies only to samples taken after fasting for at least 8 hours.  TSH     Status: None   Collection Time: 02/04/24  4:20 PM  Result Value Ref Range   TSH 3.250 0.450 - 4.500 uIU/mL  T4, free     Status: None   Collection Time: 02/04/24  4:20 PM  Result Value Ref Range   Free T4 1.23 0.82 - 1.77 ng/dL  VITAMIN D  25 Hydroxy (Vit-D Deficiency, Fractures)     Status: None   Collection Time: 02/04/24  4:20 PM  Result Value Ref Range   Vit D, 25-Hydroxy 69.0 30.0 - 100.0 ng/mL    Comment: Vitamin D  deficiency has been defined by the Institute of Medicine and an Endocrine Society practice guideline as a level of serum 25-OH vitamin D  less than 20 ng/mL (1,2). The Endocrine Society went on to further define vitamin D  insufficiency as a level between 21 and 29 ng/mL (2). 1. IOM (Institute of Medicine). 2010. Dietary reference    intakes for calcium and D. [place]: The    [employer]. 2. Holick MF, Binkley [place], Bischoff-Ferrari HA, et al.    Evaluation, treatment, and prevention of vitamin D     deficiency: an Endocrine Society clinical practice    guideline. JCEM. 2011 Jul; 96(7):1911-30.   Basic Metabolic Panel (BMET)     Status: Abnormal   Collection Time: 02/11/24 12:01 PM  Result Value Ref Range  Sodium 139 135 - 145 mmol/L   Potassium 3.4 (L) 3.5 - 5.1 mmol/L   Chloride 101 98 - 111 mmol/L   CO2 25 22 - 32 mmol/L   Glucose, Bld 107 (H) 70 - 99 mg/dL    Comment: Glucose reference range applies only to samples taken after fasting for at least 8 hours.   BUN 10 6 - 20 mg/dL   Creatinine, Ser 8.96 (H) 0.44 - 1.00 mg/dL   Calcium 9.3 8.9 - 89.6 mg/dL    GFR, Estimated >39 >39 mL/min    Comment: (NOTE) Calculated using the CKD-EPI Creatinine Equation (2021)    Anion gap 13 5 - 15    Comment: Performed at [facility], [address] [place], [place] [address]    Latest Reference Range & Units 11/21/21 14:52 04/05/22 13:01 08/07/22 13:26 02/02/23 11:39 02/04/24 16:20  TSH 0.450 - 4.500 uIU/mL 0.054 (L) 1.140 1.010 1.030 3.250  T4,Free(Direct) 0.82 - 1.77 ng/dL 8.12 (H) 8.75 8.79 8.71 1.23  (L): Data is abnormally low (H): Data is abnormally high   Latest Reference Range & Units 11/16/20 16:36 11/21/21 14:52 08/07/22 13:26 02/04/24 16:20  Vitamin D , 25-Hydroxy 30.0 - 100.0 ng/mL 57.7 84.4 28.8 (L) 69.0  (L): Data is abnormally low  Assessment & Plan:   1. Hypothyroidism due to Hashimoto's thyroiditis -Her previsit thyroid  function tests are consistent appropriate hormone replacement.  She is advised to continue Levothyroxine  75 mcg po daily before breakfast.   - We discussed about the correct intake of her thyroid  hormone, on empty stomach at fasting, with water , separated by at least 30 minutes from breakfast and other medications,  and separated by more than 4 hours from calcium, iron, multivitamins, acid reflux medications (PPIs). -Patient is made aware of the fact that thyroid  hormone replacement is needed for life, dose to be adjusted by periodic monitoring of thyroid  function tests.  2. Pre-diabetes -Her most recent A1c on 4/23 was 5.3%, improving from last visit of 5.5%- out of the prediabetes category.  Still will not require any pharmacological intervention at this time.   Weight loss will help.  - Nutritional counseling repeated at each appointment due to patients tendency to fall back in to old habits.  - The patient admits there is a room for improvement in their diet and drink choices. -  Suggestion is made for the patient to avoid simple carbohydrates from their diet including Cakes, Sweet Desserts / Pastries, Ice Cream,  Soda (diet and regular), Sweet Tea, Candies, Chips, Cookies, Sweet Pastries, Store Bought Juices, Alcohol in Excess of 1-2 drinks a day, Artificial Sweeteners, Coffee Creamer, and Sugar-free Products. This will help patient to have stable blood glucose profile and potentially avoid unintended weight gain.   - I encouraged the patient to switch to unprocessed or minimally processed complex starch and increased protein intake (animal or plant source), fruits, and vegetables.   - Patient is advised to stick to a routine mealtimes to eat 3 meals a day and avoid unnecessary snacks (to snack only to correct hypoglycemia).  3.  Vitamin D  deficiency- Her most recent vitamin D  level was 69 on 02/04/24.  She is advised to continue OTC Vitamin D3 5000 units daily.     - I advised patient to maintain close follow up with Fanta, Tesfaye Demissie, MD for primary care needs.     I spent  15  minutes in the care of the patient today including review of labs from CMP, Lipids, Thyroid  Function,  Hematology (current and previous including abstractions from other facilities); face-to-face time discussing  her blood glucose readings/logs, discussing hypoglycemia and hyperglycemia episodes and symptoms, medications doses, her options of short and long term treatment based on the latest standards of care / guidelines;  discussion about incorporating lifestyle medicine;  and documenting the encounter. Risk reduction counseling performed per USPSTF guidelines to reduce obesity and cardiovascular risk factors.     Please refer to Patient Instructions for Blood Glucose Monitoring and Insulin/Medications Dosing Guide  in media tab for additional information. Please  also refer to  Patient Self Inventory in the Media  tab for reviewed elements of pertinent patient history.  Dawn Barnes participated in the discussions, expressed understanding, and voiced agreement with the above plans.  All questions were answered to her  satisfaction. she is encouraged to contact clinic should she have any questions or concerns prior to her return visit.   Follow up plan: Return in about 1 year (around 02/11/2025) for Thyroid  follow up, Previsit labs.    Benton Rio, [facility] [address] [place], [place] [address] Phone: [phone] Fax: [phone]  02/12/2024, 1:13 PM

## 2024-02-18 ENCOUNTER — Telehealth: Payer: Self-pay | Admitting: *Deleted

## 2024-02-18 NOTE — Telephone Encounter (Signed)
 [place] APPROVED VIA [facility]  Authorization #:[id number] Feb 18, 2024 - May 18, 2024

## 2024-02-19 ENCOUNTER — Encounter (HOSPITAL_COMMUNITY): Payer: Self-pay | Admitting: Internal Medicine

## 2024-02-19 ENCOUNTER — Other Ambulatory Visit: Payer: Self-pay

## 2024-02-19 ENCOUNTER — Ambulatory Visit (HOSPITAL_BASED_OUTPATIENT_CLINIC_OR_DEPARTMENT_OTHER): Payer: Self-pay | Admitting: Anesthesiology

## 2024-02-19 ENCOUNTER — Ambulatory Visit (HOSPITAL_COMMUNITY)
Admission: RE | Admit: 2024-02-19 | Discharge: 2024-02-19 | Disposition: A | Discharge status: Home / Self Care | Attending: Internal Medicine | Admitting: Internal Medicine

## 2024-02-19 ENCOUNTER — Encounter (HOSPITAL_COMMUNITY)
Admission: RE | Disposition: A | Discharge status: Home / Self Care | Payer: Self-pay | Source: Home / Self Care | Attending: Internal Medicine

## 2024-02-19 ENCOUNTER — Encounter (HOSPITAL_COMMUNITY): Payer: Self-pay | Admitting: Anesthesiology

## 2024-02-19 DIAGNOSIS — D509 Iron deficiency anemia, unspecified: Secondary | ICD-10-CM | POA: Diagnosis present

## 2024-02-19 DIAGNOSIS — I10 Essential (primary) hypertension: Secondary | ICD-10-CM | POA: Diagnosis not present

## 2024-02-19 DIAGNOSIS — K219 Gastro-esophageal reflux disease without esophagitis: Secondary | ICD-10-CM | POA: Diagnosis not present

## 2024-02-19 DIAGNOSIS — R7303 Prediabetes: Secondary | ICD-10-CM | POA: Diagnosis not present

## 2024-02-19 SURGERY — SIGMOIDOSCOPY, FLEXIBLE
Anesthesia: General

## 2024-02-19 MED ORDER — PROPOFOL 500 MG/50ML IV EMUL
INTRAVENOUS | Status: DC | PRN
  Administered 2024-02-19: 100 mg via INTRAVENOUS

## 2024-02-19 MED ORDER — LACTATED RINGERS IV SOLN: INTRAVENOUS | Status: DC

## 2024-02-19 NOTE — Op Note (Signed)
 [facility] Patient Name: Dawn Barnes Procedure Date: 02/19/2024 11:34 AM MRN: [id number] Date of Birth: 1965/10/05 Attending MD: [name]. [name] HAS, [id number] CSN: [id number] Age: 58 Admit Type: Outpatient Procedure:                Colonoscopy Indications:              Iron deficiency anemia Providers:                [name]. [name], DO, Madelin Hunter, RN, Daphne Mulch Technician, Technician Referring MD:              Medicines:                See the Anesthesia note for documentation of the                            administered medications Complications:            No immediate complications. Estimated Blood Loss:     Estimated blood loss: none. Procedure:                Pre-Anesthesia Assessment:                           - The anesthesia plan was to use monitored                            anesthesia care (MAC).                           After obtaining informed consent, the colonoscope                            was passed under direct vision. Throughout the                            procedure, the patient's blood pressure, pulse, and                            oxygen saturations were monitored continuously. The                            PCF-HQ190L ([id number]) scope was introduced through                            the anus with the intention of advancing to the                            cecum. The scope was advanced to the sigmoid colon                            before the procedure was aborted. Medications were                            given. The colonoscopy was  performed without                            difficulty. The patient tolerated the procedure                            well. The quality of the bowel preparation was                            evaluated using the BBPS [facility] Bowel Preparation                            Scale) with scores of: Left Colon = 0 (unprepared,                            mucosa not seen due to solid  stool that cannot be                            cleared or unseen proximal colon segment in a                            colonoscopy aborted due to inadequate bowel prep).                            The total BBPS score equals 0. The quality of the                            bowel preparation was inadequate. Scope In: 11:57:59 AM Scope Out: 11:59:31 AM Total Procedure Duration: 0 hours 1 minute 32 seconds  Findings:      Extensive amounts of semi-solid stool was found in the rectum and in the       sigmoid colon, precluding visualization. Impression:               - Preparation of the colon was inadequate.                           - Stool in the rectum and in the sigmoid colon.                           - No specimens collected. Moderate Sedation:      Per Anesthesia Care Recommendation:           - Patient has a contact number available for                            emergencies. The signs and symptoms of potential                            delayed complications were discussed with the                            patient. Return to normal activities tomorrow.  Written discharge instructions were provided to the                            patient.                           - Resume previous diet.                           - Continue present medications.                           - Repeat colonoscopy in 3 months because the bowel                            preparation was poor.                           - Return to GI clinic in 6 weeks. Procedure Code(s):        --- Professional ---                           [id number], 53, Colonoscopy, flexible; diagnostic,                            including collection of specimen(s) by brushing or                            washing, when performed (separate procedure) Diagnosis Code(s):        --- Professional ---                           D50.9, Iron deficiency anemia, unspecified CPT copyright 2022 American Medical  Association. All rights reserved. The codes documented in this report are preliminary and upon coder review may  be revised to meet current compliance requirements. [name]. [name], DO [name]. [name], DO 02/19/2024 12:03:12 PM This report has been signed electronically. Number of Addenda: 0

## 2024-02-19 NOTE — H&P (Addendum)
 Primary Care Physician:  Carlette Benita Area, MD Primary Gastroenterologist:  Dr. [name]  Pre-Procedure History & Physical: HPI:  Dawn Barnes is a 58 y.o. female is here for a colonoscopy to be performed for iron deficiency anemia.  Relatively recent upper endoscopy March 2025 so we will forego this procedure.  Past Medical History:  Diagnosis Date   Anemia    Anxiety    Constipation 04/30/2013   Depression    Diabetes mellitus without complication (HCC)    borderline   Fibroids, intramural 01/21/2016   GERD (gastroesophageal reflux disease)    Heartburn    Hot flashes 01/11/2016   Hypertension    Hypothyroidism    Mixed incontinence    Pancreatitis    PONV (postoperative nausea and vomiting)    Pre-diabetes    PTSD (post-traumatic stress disorder)    Thyroid  disease    Trauma    Urge urinary incontinence 05/10/2015   Vaginal bleeding 01/11/2016    Past Surgical History:  Procedure Laterality Date   BALLOON DILATION N/A 10/22/2023   Procedure: BALLOON DILATION;  Surgeon: [name] [name], DO;  Location: [facility];  Service: Endoscopy;  Laterality: N/A;   BIOPSY  12/07/2015   Procedure: BIOPSY;  Surgeon: Margo LITTIE Haddock, MD;  Location: [facility];  Service: Endoscopy;;  right and left colon biopsies, gastric bx's    BLADDER REPAIR     COLONOSCOPY WITH PROPOFOL  N/A 12/07/2015   Dr. Haddock: mild proctitis, hyperplastic polyp    ENDOMETRIAL ABLATION     ESOPHAGOGASTRODUODENOSCOPY (EGD) WITH PROPOFOL  N/A 12/07/2015   Dr. 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    ESOPHAGOGASTRODUODENOSCOPY (EGD) WITH PROPOFOL  N/A 10/22/2023   Procedure: ESOPHAGOGASTRODUODENOSCOPY (EGD) WITH PROPOFOL ;  Surgeon: [name] [name], DO;  Location: [facility];  Service: Endoscopy;  Laterality: N/A;  11:30 AM, ASA 3   INCONTINENCE SURGERY     KNEE ARTHROSCOPY WITH MEDIAL MENISECTOMY Left 11/17/2021   Procedure: KNEE ARTHROSCOPY  WITH MEDIAL MENISECTOMY;  Surgeon: Onesimo Oneil LABOR, MD;  Location: [facility];  Service: Orthopedics;  Laterality: Left;   MULTIPLE TOOTH EXTRACTIONS     SAVORY DILATION N/A 12/07/2015   Procedure: SAVORY DILATION;  Surgeon: Margo LITTIE Haddock, MD;  Location: [facility];  Service: Endoscopy;  Laterality: N/A;   TUBAL LIGATION     TYMPANOPLASTY Right 01/28/2024   Procedure: TYMPANOPLASTY;  Surgeon: Jesus Oliphant, MD;  Location: [facility];  Service: ENT;  Laterality: Right;    Prior to Admission medications   Medication Sig Start Date End Date Taking? Authorizing Provider  cetirizine (ZYRTEC) 10 MG tablet Take 10 mg by mouth daily.   Yes [provider]  Cholecalciferol (VITAMIN D -3) 125 MCG (5000 UT) TABS Take 5,000 Units by mouth daily.   Yes [provider]  cyanocobalamin  (VITAMIN B12) 1000 MCG tablet Take 1,000 mcg by mouth daily.   Yes [provider]  escitalopram (LEXAPRO) 20 MG tablet Take 20 mg by mouth daily.   Yes [provider]  hydrochlorothiazide (HYDRODIURIL) 12.5 MG tablet Take 12.5 mg by mouth daily. 10/31/21  Yes [provider]  levothyroxine  (SYNTHROID ) 75 MCG tablet Take 1 tablet (75 mcg total) by mouth daily before breakfast. 02/12/24  Yes Therisa Benton PARAS, NP  lubiprostone  (AMITIZA ) 24 MCG capsule TAKE ONE CAPSULE BY MOUTH TWICE DAILY WITH MEALS 12/20/23  Yes Shirlean Therisa ORN, NP  nitrofurantoin , macrocrystal-monohydrate, (MACROBID ) 100 MG capsule Take 1 capsule (100 mg  total) by mouth 2 (two) times daily. 12/26/23  Yes Leath-Warren, Etta PARAS, NP  omeprazole (PRILOSEC) 20 MG capsule Take 40 mg by mouth daily.   Yes [provider]  polyethylene glycol (MIRALAX / GLYCOLAX) 17 g packet Take 17 g by mouth daily as needed for moderate constipation.   Yes [provider]  sulfamethoxazole -trimethoprim  (BACTRIM  DS) 800-160 MG tablet Take 1 tablet by mouth 2 (two) times daily. 02/11/24  Yes [provider]   TOVIAZ 8 MG TB24 tablet Take 8 mg by mouth daily. 03/02/20  Yes [provider]    Allergies as of 01/17/2024 - Review Complete 12/26/2023  Allergen Reaction Noted   Ibuprofen Other (See Comments) 05/30/2021   Nsaids Other (See Comments) 01/17/2016   Tuberculin tests Dermatitis 01/18/2015    Family History  Problem Relation Age of Onset   Diabetes Father    Heart disease Father    Hypertension Sister    Heart disease Sister    Stroke Paternal Aunt    Dementia Paternal Aunt    Hypertension Paternal Aunt    Heart disease Paternal Aunt    Seizures Paternal Aunt    Breast cancer Paternal Aunt    Stroke Paternal Grandmother    Heart disease Paternal Grandmother    Depression Daughter    Depression Daughter    Depression Son    ADD / ADHD Son    Colon cancer Neg Hx    Colon polyps Neg Hx     Social History   Socioeconomic History   Marital status: Divorced    Spouse name: Not on file   Number of children: Not on file   Years of education: Not on file   Highest education level: Not on file  Occupational History   Occupation: disability  Tobacco Use   Smoking status: Never   Smokeless tobacco: Never  Vaping Use   Vaping status: Never Used  Substance and Sexual Activity   Alcohol use: No    Alcohol/week: 0.0 standard drinks of alcohol   Drug use: No   Sexual activity: Not Currently    Birth control/protection: Surgical    Comment: tubal and ablation  Other Topics Concern   Not on file  Social History Narrative   Are you right handed or left handed?    Are you currently employed ?    What is your current occupation?   Do you live at home alone?   Who lives with you?    What type of home do you live in: 1 story or 2 story? 1       Social Drivers of Corporate investment banker Strain: Low Risk  (11/02/2022)   Overall Financial Resource Strain (CARDIA)    Difficulty of Paying Living Expenses: Not hard at all  Food Insecurity: Low Risk  (05/01/2023)    Received from [facility]   Hunger Vital Sign    Within the past 12 months, you worried that your food would run out before you got money to buy more: Never true    Within the past 12 months, the food you bought just didn't last and you didn't have money to get more. : Never true  Transportation Needs: No Transportation Needs (05/01/2023)   Received from [facility]    In the past 12 months, has lack of reliable transportation kept you from medical appointments, meetings, work or from getting things needed for daily living? : No  Physical Activity: Inactive (11/02/2022)  Exercise Vital Sign    Days of Exercise per Week: 0 days    Minutes of Exercise per Session: 10 min  Stress: Stress Concern Present (11/02/2022)   [employer] of Occupational Health - Occupational Stress Questionnaire    Feeling of Stress : To some extent  Social Connections: Socially Isolated (11/02/2022)   Social Connection and Isolation Panel    Frequency of Communication with Friends and Family: Three times a week    Frequency of Social Gatherings with Friends and Family: Once a week    Attends Religious Services: Never    Database administrator or Organizations: No    Attends Banker Meetings: Never    Marital Status: Divorced  Catering manager Violence: Not At Risk (11/02/2022)   Humiliation, Afraid, Rape, and Kick questionnaire    Fear of Current or Ex-Partner: No    Emotionally Abused: No    Physically Abused: No    Sexually Abused: No    Review of Systems: General: Negative for fever, chills, fatigue, weakness. Eyes: Negative for vision changes.  ENT: Negative for hoarseness, difficulty swallowing , nasal congestion. CV: Negative for chest pain, angina, palpitations, dyspnea on exertion, peripheral edema.  Respiratory: Negative for dyspnea at rest, dyspnea on exertion, cough, sputum, wheezing.  GI: See history of present illness. GU:  Negative for dysuria, hematuria,  urinary incontinence, urinary frequency, nocturnal urination.  MS: Negative for joint pain, low back pain.  Derm: Negative for rash or itching.  Neuro: Negative for weakness, abnormal sensation, seizure, frequent headaches, memory loss, confusion.  Psych: Negative for anxiety, depression Endo: Negative for unusual weight change.  Heme: Negative for bruising or bleeding. Allergy: Negative for rash or hives.  Physical Exam: Vital signs in last 24 hours: Temp:  [98.3 F (36.8 C)] 98.3 F (36.8 C) (07/22 0954) Pulse Rate:  [64] 64 (07/22 0954) Resp:  [20] 20 (07/22 0954) BP: (119)/(74) 119/74 (07/22 0954) SpO2:  [99 %] 99 % (07/22 0954)   General:   Alert,  Well-developed, well-nourished, pleasant and cooperative in NAD Head:  Normocephalic and atraumatic. Eyes:  Sclera clear, no icterus.   Conjunctiva pink. Ears:  Normal auditory acuity. Nose:  No deformity, discharge,  or lesions. Msk:  Symmetrical without gross deformities. Normal posture. Extremities:  Without clubbing or edema. Neurologic:  Alert and  oriented x4;  grossly normal neurologically. Skin:  Intact without significant lesions or rashes. Psych:  Alert and cooperative. Normal mood and affect.   Impression/Plan: Dawn Barnes is here for a colonoscopy to be performed for iron deficiency anemia. Relatively recent upper endoscopy March 2025 so we will forego this procedure.  Risks, benefits, limitations, imponderables and alternatives regarding procedure have been reviewed with the patient. Questions have been answered. All parties agreeable.

## 2024-02-19 NOTE — Anesthesia Procedure Notes (Signed)
 Date/Time: 02/19/2024 11:52 AM  Performed by: Barbarann Verneita RAMAN, CRNAPre-anesthesia Checklist: Patient identified, Emergency Drugs available, Suction available, Timeout performed and Patient being monitored Patient Re-evaluated:Patient Re-evaluated prior to induction Oxygen Delivery Method: Nasal cannula Comments: Optiflow

## 2024-02-19 NOTE — Anesthesia Preprocedure Evaluation (Signed)
 Anesthesia Evaluation  Patient identified by MRN, date of birth, ID band Patient awake    Reviewed: Allergy & Precautions, H&P , NPO status , Patient's Chart, lab work & pertinent test results, reviewed documented beta blocker date and time   History of Anesthesia Complications (+) PONV and history of anesthetic complications  Airway Mallampati: II  TM Distance: >3 FB Neck ROM: full    Dental no notable dental hx.    Pulmonary neg pulmonary ROS   Pulmonary exam normal breath sounds clear to auscultation       Cardiovascular Exercise Tolerance: Good hypertension,  Rhythm:regular Rate:Normal     Neuro/Psych  PSYCHIATRIC DISORDERS Anxiety Depression    negative neurological ROS     GI/Hepatic Neg liver ROS,GERD  ,,  Endo/Other  diabetesHypothyroidism    Renal/GU negative Renal ROS  negative genitourinary   Musculoskeletal   Abdominal   Peds  Hematology  (+) Blood dyscrasia, anemia   Anesthesia Other Findings   Reproductive/Obstetrics negative OB ROS                              Anesthesia Physical Anesthesia Plan  ASA: 2  Anesthesia Plan: General   Post-op Pain Management:    Induction:   PONV Risk Score and Plan: Propofol  infusion  Airway Management Planned:   Additional Equipment:   Intra-op Plan:   Post-operative Plan:   Informed Consent: I have reviewed the patients History and Physical, chart, labs and discussed the procedure including the risks, benefits and alternatives for the proposed anesthesia with the patient or authorized representative who has indicated his/her understanding and acceptance.     Dental Advisory Given  Plan Discussed with: CRNA  Anesthesia Plan Comments:         Anesthesia Quick Evaluation

## 2024-02-19 NOTE — Discharge Instructions (Addendum)
  Colonoscopy Discharge Instructions  Read the instructions outlined below and refer to this sheet in the next few weeks. These discharge instructions provide you with general information on caring for yourself after you leave the hospital. Your doctor may also give you specific instructions. While your treatment has been planned according to the most current medical practices available, unavoidable complications occasionally occur.   ACTIVITY You may resume your regular activity, but move at a slower pace for the next 24 hours.  Take frequent rest periods for the next 24 hours.  Walking will help get rid of the air and reduce the bloated feeling in your belly (abdomen).  No driving for 24 hours (because of the medicine (anesthesia) used during the test).   Do not sign any important legal documents or operate any machinery for 24 hours (because of the anesthesia used during the test).  NUTRITION Drink plenty of fluids.  You may resume your normal diet as instructed by your doctor.  Begin with a light meal and progress to your normal diet. Heavy or fried foods are harder to digest and may make you feel sick to your stomach (nauseated).  Avoid alcoholic beverages for 24 hours or as instructed.  MEDICATIONS You may resume your normal medications unless your doctor tells you otherwise.  WHAT YOU CAN EXPECT TODAY Some feelings of bloating in the abdomen.  Passage of more gas than usual.  Spotting of blood in your stool or on the toilet paper.  IF YOU HAD POLYPS REMOVED DURING THE COLONOSCOPY: No aspirin  products for 7 days or as instructed.  No alcohol for 7 days or as instructed.  Eat a soft diet for the next 24 hours.  FINDING OUT THE RESULTS OF YOUR TEST Not all test results are available during your visit. If your test results are not back during the visit, make an appointment with your caregiver to find out the results. Do not assume everything is normal if you have not heard from your  caregiver or the medical facility. It is important for you to follow up on all of your test results.  SEEK IMMEDIATE MEDICAL ATTENTION IF: You have more than a spotting of blood in your stool.  Your belly is swollen (abdominal distention).  You are nauseated or vomiting.  You have a temperature over 101.  You have abdominal pain or discomfort that is severe or gets worse throughout the day.   Unfortunately, your colon was not adequately prepped today for colonoscopy.  Would recommend repeat colonoscopy in 3-6 months with a different colon prep. Follow up in GI office in 4-6 weeks.    I hope you have a great rest of your week!  Dawn Barnes. Dawn Barnes, D.O. Gastroenterology and Hepatology [facility]

## 2024-02-19 NOTE — Transfer of Care (Signed)
 Immediate Anesthesia Transfer of Care Note  Patient: Dawn Barnes  Procedure(s) Performed: KINGSTON SIDE  Patient Location: Endoscopy Unit  Anesthesia Type:General  Level of Consciousness: awake and patient cooperative  Airway & Oxygen Therapy: Patient Spontanous Breathing  Post-op Assessment: Report given to RN and Post -op Vital signs reviewed and stable  Post vital signs: Reviewed and stable  Last Vitals:  Vitals Value Taken Time  BP 96/58 02/19/24 12:06  Temp 36.9 C 02/19/24 12:06  Pulse 50 02/19/24 12:06  Resp 14 02/19/24 12:06  SpO2 93 % 02/19/24 12:06    Last Pain:  Vitals:   02/19/24 1206  TempSrc: Oral  PainSc: 0-No pain      Patients Stated Pain Goal: 5 (02/19/24 0954)  Complications: No notable events documented.

## 2024-02-20 ENCOUNTER — Encounter (HOSPITAL_COMMUNITY): Payer: Self-pay | Admitting: Internal Medicine

## 2024-02-20 NOTE — Anesthesia Postprocedure Evaluation (Signed)
 Anesthesia Post Note  Patient: Dawn Barnes  Procedure(s) Performed: KINGSTON SIDE  Patient location during evaluation: Phase II Anesthesia Type: General Level of consciousness: awake Pain management: pain level controlled Vital Signs Assessment: post-procedure vital signs reviewed and stable Respiratory status: spontaneous breathing and respiratory function stable Cardiovascular status: blood pressure returned to baseline and stable Postop Assessment: no headache and no apparent nausea or vomiting Anesthetic complications: no Comments: Late entry   No notable events documented.   Last Vitals:  Vitals:   02/19/24 0954 02/19/24 1206  BP: 119/74 (!) 96/58  Pulse: 64 (!) 50  Resp: 20 14  Temp: 36.8 C 36.9 C  SpO2: 99% 93%    Last Pain:  Vitals:   02/19/24 1206  TempSrc: Oral  PainSc: 0-No pain                 Yvonna JINNY Bosworth

## 2024-04-01 ENCOUNTER — Ambulatory Visit: Admitting: Gastroenterology

## 2024-04-01 VITALS — BP 131/86 | HR 71 | Temp 98.2°F | Ht 64.0 in | Wt 204.0 lb

## 2024-04-01 DIAGNOSIS — K5909 Other constipation: Secondary | ICD-10-CM

## 2024-04-01 DIAGNOSIS — D649 Anemia, unspecified: Secondary | ICD-10-CM | POA: Diagnosis not present

## 2024-04-01 DIAGNOSIS — K760 Fatty (change of) liver, not elsewhere classified: Secondary | ICD-10-CM

## 2024-04-01 NOTE — Progress Notes (Signed)
 Gastroenterology Office Note     Primary Care Physician:  Carlette Benita Area, MD  Primary Gastroenterologist: Dr. [name]   Chief Complaint   Chief Complaint  Patient presents with   Follow-up    Follow up abd US  and anemia     History of Present Illness   Dawn Barnes is a 58 y.o. female presenting today with a history of GERD, chronic dysphagia, chronic constipation, hepatic steatosis, anemia with IDA component, inadequate prep on recent colonoscopy and needs to have early interval. Attempted colonoscopy July 2025. Here for follow-up to discuss.    Felt sick with bowel prep. Nulytely. Only drank half of this.   Amitiza  24 mcg po BID. BM a few times a week. Hasn't taken Miralax with this. Was on Linzess  in the past but then stopped working. Eats a lot of pasta. Tries to eat beans and veggies. Not drinking lots of water .   Hepatic steatosis: not ideal elastography due to data quality, no obvious fibrosis. ELF was 9.32 ( need a second NIT to confirm). Transaminases normal. FIB-4 0.96, advanced fibrosis excluded.   Colonoscopy July 2025: inadequate prep, needs repeat  EGD March 2025: small hiatal hernia, normal stomach and duodenum, empiric dilation performed.   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    Past Medical History:  Diagnosis Date   Anemia    Anxiety    Constipation 04/30/2013   Depression    Diabetes mellitus without complication (HCC)    borderline   Fibroids, intramural 01/21/2016   GERD (gastroesophageal reflux disease)    Heartburn    Hot flashes 01/11/2016   Hypertension    Hypothyroidism    Mixed incontinence    Pancreatitis    PONV (postoperative nausea and vomiting)    Pre-diabetes    PTSD (post-traumatic stress disorder)    Thyroid  disease    Trauma    Urge urinary incontinence 05/10/2015   Vaginal bleeding  01/11/2016    Past Surgical History:  Procedure Laterality Date   BALLOON DILATION N/A 10/22/2023   Procedure: BALLOON DILATION;  Surgeon: [name] [name], DO;  Location: [facility];  Service: Endoscopy;  Laterality: N/A;   BIOPSY  12/07/2015   Procedure: BIOPSY;  Surgeon: Margo LITTIE Haddock, MD;  Location: [facility];  Service: Endoscopy;;  right and left colon biopsies, gastric bx's    BLADDER REPAIR     COLONOSCOPY WITH PROPOFOL  N/A 12/07/2015   Dr. Haddock: mild proctitis, hyperplastic polyp    ENDOMETRIAL ABLATION     ESOPHAGOGASTRODUODENOSCOPY (EGD) WITH PROPOFOL  N/A 12/07/2015   Dr. 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    ESOPHAGOGASTRODUODENOSCOPY (EGD) WITH PROPOFOL  N/A 10/22/2023   Procedure: ESOPHAGOGASTRODUODENOSCOPY (EGD) WITH PROPOFOL ;  Surgeon: [name] [name], DO;  Location: [facility];  Service: Endoscopy;  Laterality: N/A;  11:30 AM, ASA 3   FLEXIBLE SIGMOIDOSCOPY  02/19/2024   Procedure: SIGMOIDOSCOPY, FLEXIBLE;  Surgeon: [name] [name], DO;  Location: [facility];  Service: Endoscopy;;   INCONTINENCE SURGERY     KNEE ARTHROSCOPY WITH MEDIAL MENISECTOMY Left 11/17/2021   Procedure: KNEE ARTHROSCOPY WITH MEDIAL MENISECTOMY;  Surgeon: Onesimo Oneil LABOR, MD;  Location: [facility];  Service: Orthopedics;  Laterality: Left;   MULTIPLE TOOTH EXTRACTIONS     SAVORY DILATION N/A 12/07/2015   Procedure: SAVORY DILATION;  Surgeon: Margo LITTIE Haddock, MD;  Location: [facility];  Service: Endoscopy;  Laterality: N/A;   TUBAL LIGATION     TYMPANOPLASTY Right 01/28/2024   Procedure: TYMPANOPLASTY;  Surgeon: Jesus Oliphant, MD;  Location: [facility];  Service: ENT;  Laterality: Right;    Current Outpatient Medications  Medication Sig Dispense Refill   cetirizine (ZYRTEC) 10 MG tablet Take 10 mg by mouth daily.     Cholecalciferol (VITAMIN D -3) 125 MCG (5000 UT) TABS Take 5,000 Units by mouth daily.      cyanocobalamin  (VITAMIN B12) 1000 MCG tablet Take 1,000 mcg by mouth daily.     escitalopram (LEXAPRO) 20 MG tablet Take 20 mg by mouth daily.     hydrochlorothiazide (HYDRODIURIL) 12.5 MG tablet Take 12.5 mg by mouth daily.     levothyroxine  (SYNTHROID ) 75 MCG tablet Take 1 tablet (75 mcg total) by mouth daily before breakfast. 90 tablet 3   lubiprostone  (AMITIZA ) 24 MCG capsule TAKE ONE CAPSULE BY MOUTH TWICE DAILY WITH MEALS 60 capsule 3   nitrofurantoin , macrocrystal-monohydrate, (MACROBID ) 100 MG capsule Take 1 capsule (100 mg total) by mouth 2 (two) times daily. 10 capsule 0   omeprazole (PRILOSEC) 20 MG capsule Take 40 mg by mouth daily.     polyethylene glycol (MIRALAX / GLYCOLAX) 17 g packet Take 17 g by mouth daily as needed for moderate constipation.     sulfamethoxazole -trimethoprim  (BACTRIM  DS) 800-160 MG tablet Take 1 tablet by mouth 2 (two) times daily.     TOVIAZ 8 MG TB24 tablet Take 8 mg by mouth daily.     No current facility-administered medications for this visit.    Allergies as of 04/01/2024 - Review Complete 04/01/2024  Allergen Reaction Noted   Ibuprofen Other (See Comments) 05/30/2021   Nsaids Other (See Comments) 01/17/2016   Tuberculin tests Dermatitis 01/18/2015    Family History  Problem Relation Age of Onset   Diabetes Father    Heart disease Father    Hypertension Sister    Heart disease Sister    Stroke Paternal Aunt    Dementia Paternal Aunt    Hypertension Paternal Aunt    Heart disease Paternal Aunt    Seizures Paternal Aunt    Breast cancer Paternal Aunt    Stroke Paternal Grandmother    Heart disease Paternal Grandmother    Depression Daughter    Depression Daughter    Depression Son    ADD / ADHD Son    Colon cancer Neg Hx    Colon polyps Neg Hx     Social History   Socioeconomic History   Marital status: Divorced    Spouse name: Not on file   Number of children: Not on file   Years of education: Not on file   Highest education  level: Not on file  Occupational History   Occupation: disability  Tobacco Use   Smoking status: Never   Smokeless tobacco: Never  Vaping Use   Vaping status: Never Used  Substance and Sexual Activity   Alcohol use: No    Alcohol/week: 0.0 standard drinks of alcohol   Drug use: No   Sexual activity: Not Currently    Birth control/protection: Surgical    Comment: tubal and ablation  Other Topics Concern   Not on file  Social History Narrative   Are you right handed or left handed?    Are you currently employed ?    What is your current occupation?   Do you live at home alone?   Who lives with  you?    What type of home do you live in: 1 story or 2 story? 1       Social Drivers of Corporate investment banker Strain: Low Risk  (11/02/2022)   Overall Financial Resource Strain (CARDIA)    Difficulty of Paying Living Expenses: Not hard at all  Food Insecurity: Low Risk  (05/01/2023)   Received from [facility]   Hunger Vital Sign    Within the past 12 months, you worried that your food would run out before you got money to buy more: Never true    Within the past 12 months, the food you bought just didn't last and you didn't have money to get more. : Never true  Transportation Needs: No Transportation Needs (05/01/2023)   Received from [facility]    In the past 12 months, has lack of reliable transportation kept you from medical appointments, meetings, work or from getting things needed for daily living? : No  Physical Activity: Inactive (11/02/2022)   Exercise Vital Sign    Days of Exercise per Week: 0 days    Minutes of Exercise per Session: 10 min  Stress: Stress Concern Present (11/02/2022)   [employer] of Occupational Health - Occupational Stress Questionnaire    Feeling of Stress : To some extent  Social Connections: Socially Isolated (11/02/2022)   Social Connection and Isolation Panel    Frequency of Communication with Friends and Family: Three  times a week    Frequency of Social Gatherings with Friends and Family: Once a week    Attends Religious Services: Never    Database administrator or Organizations: No    Attends Banker Meetings: Never    Marital Status: Divorced  Catering manager Violence: Not At Risk (11/02/2022)   Humiliation, Afraid, Rape, and Kick questionnaire    Fear of Current or Ex-Partner: No    Emotionally Abused: No    Physically Abused: No    Sexually Abused: No     Review of Systems   Gen: Denies any fever, chills, fatigue, weight loss, lack of appetite.  CV: Denies chest pain, heart palpitations, peripheral edema, syncope.  Resp: Denies shortness of breath at rest or with exertion. Denies wheezing or cough.  GI: Denies dysphagia or odynophagia. Denies jaundice, hematemesis, fecal incontinence. GU : Denies urinary burning, urinary frequency, urinary hesitancy MS: Denies joint pain, muscle weakness, cramps, or limitation of movement.  Derm: Denies rash, itching, dry skin Psych: Denies depression, anxiety, memory loss, and confusion Heme: Denies bruising, bleeding, and enlarged lymph nodes.   Physical Exam   BP 131/86   Pulse 71   Temp 98.2 F (36.8 C)   Ht 5' 4 (1.626 m)   Wt 204 lb (92.5 kg)   BMI 35.02 kg/m  General:   Alert and oriented. Pleasant and cooperative. Well-nourished and well-developed.  Head:  Normocephalic and atraumatic. Eyes:  Without icterus Abdomen:  +BS, soft, non-tender and non-distended. No HSM noted. No guarding or rebound. No masses appreciated.  Rectal:  Deferred  Msk:  Symmetrical without gross deformities. Normal posture. Extremities:  Without edema. Neurologic:  Alert and  oriented x4;  grossly normal neurologically. Skin:  Intact without significant lesions or rashes. Psych:  Alert and cooperative. Normal mood and affect.   Assessment   Dawn Barnes is a 58 y.o. female presenting today with a history of GERD, chronic dysphagia, chronic  constipation, hepatic steatosis, anemia with IDA component, inadequate prep  on recent colonoscopy and needs to have early interval. Attempted colonoscopy July 2025. Here for follow-up to discuss.   Anemia with worsening ferritin in June 2025. EGD on file March 2025 s/p empiric dilation. Attempt at colonoscopy in July unsuccessful due to feeling nauseated with bowel prep and not completing. Underlying constipation also contributed to poor prep. Needs colonoscopy with modified prep in near future. Will also update CBC, iron studies. Will need to address constipation prior to prepping.   Hepatic steatosis: poor data quality on elastography, ELF 9.32, FIB 4 with advanced fibrosis excluded. Check fibrosure.   Constipation: failing Linzess , Amitiza  24 mcg BID. Trial Ibsrela . May need to consider Motegrity.      PLAN    Trial Ibsrela  BID Update labs for CBC, iron studies, Fibrosure Return in 4-6 weeks to ensure constipation ideally managed and arrange colonoscopy with modified prep at that time.    Therisa MICAEL Stager, PhD, ANP-BC [facility]

## 2024-04-01 NOTE — Patient Instructions (Signed)
 I would like for you to stop Amitiza . Instead, start Ibsrela one tablet twice a day, just before eating. Please let me know how this works for you!  I have ordered updated labs to check your hemoglobin, iron, and fibrosis scores.  I will see you back in about 4-6 weeks to make sure constipation is improved, and we can arrange the colonoscopy!  I enjoyed seeing you again today! I value our relationship and want to provide genuine, compassionate, and quality care. You may receive a survey regarding your visit with me, and I welcome your feedback! Thanks so much for taking the time to complete this. I look forward to seeing you again.      Therisa MICAEL Stager, PhD, ANP-BC [facility]

## 2024-04-03 LAB — CBC WITH DIFFERENTIAL/PLATELET
Basophils Absolute: 0 x10E3/uL (ref 0.0–0.2)
Basos: 1 %
EOS (ABSOLUTE): 0.1 x10E3/uL (ref 0.0–0.4)
Eos: 1 %
Hematocrit: 39.6 % (ref 34.0–46.6)
Hemoglobin: 12.4 g/dL (ref 11.1–15.9)
Immature Grans (Abs): 0 x10E3/uL (ref 0.0–0.1)
Immature Granulocytes: 0 %
Lymphocytes Absolute: 1.7 x10E3/uL (ref 0.7–3.1)
Lymphs: 33 %
MCH: 29.3 pg (ref 26.6–33.0)
MCHC: 31.3 g/dL — ABNORMAL LOW (ref 31.5–35.7)
MCV: 94 fL (ref 79–97)
Monocytes Absolute: 0.4 x10E3/uL (ref 0.1–0.9)
Monocytes: 7 %
Neutrophils Absolute: 2.9 x10E3/uL (ref 1.4–7.0)
Neutrophils: 57 %
Platelets: 308 x10E3/uL (ref 150–450)
RBC: 4.23 x10E6/uL (ref 3.77–5.28)
RDW: 14.1 % (ref 11.7–15.4)
WBC: 5.1 x10E3/uL (ref 3.4–10.8)

## 2024-04-03 LAB — IRON,TIBC AND FERRITIN PANEL
Ferritin: 18 ng/mL (ref 15–150)
Iron Saturation: 12 % — ABNORMAL LOW (ref 15–55)
Iron: 55 ug/dL (ref 27–159)
Total Iron Binding Capacity: 442 ug/dL (ref 250–450)
UIBC: 387 ug/dL (ref 131–425)

## 2024-04-04 LAB — NASH FIBROSURE(R) PLUS
ALPHA 2-MACROGLOBULINS, QN: 196 mg/dL (ref 110–276)
ALT (SGPT) P5P: 12 IU/L (ref 0–40)
AST (SGOT) P5P: 18 IU/L (ref 0–40)
Apolipoprotein A-1: 143 mg/dL (ref 116–209)
Bilirubin, Total: 0.3 mg/dL (ref 0.0–1.2)
Cholesterol, Total: 227 mg/dL — ABNORMAL HIGH (ref 100–199)
Fibrosis Score: 0.07 (ref 0.00–0.21)
GGT: 11 IU/L (ref 0–60)
Glucose: 99 mg/dL (ref 70–99)
Haptoglobin: 252 mg/dL (ref 33–346)
NASH Score: 0 (ref 0.00–0.25)
Steatosis Score: 0.36 (ref 0.00–0.40)
Triglycerides: 150 mg/dL — ABNORMAL HIGH (ref 0–149)

## 2024-04-18 ENCOUNTER — Telehealth: Payer: Self-pay

## 2024-04-18 DIAGNOSIS — D649 Anemia, unspecified: Secondary | ICD-10-CM

## 2024-04-18 NOTE — Telephone Encounter (Signed)
 Pt phoned and advised that the IBSrela samples are working. Please send in a script to her pharmacy please.

## 2024-04-21 NOTE — Telephone Encounter (Signed)
 Pt returned call and advised she only has one pill of IBSrela  left and she wants a Rx sent to her pharmacy. Pt also wants her lab results. Pt stated she had diarrhea all day Friday but none since Friday. Please advise.

## 2024-04-22 ENCOUNTER — Ambulatory Visit: Payer: Self-pay | Admitting: Gastroenterology

## 2024-04-22 MED ORDER — IBSRELA 50 MG PO TABS: 1.0000 | ORAL_TABLET | Freq: Two times a day (BID) | ORAL | 5 refills | Status: DC

## 2024-04-22 NOTE — Telephone Encounter (Signed)
 I sent ibsrela  to a specialty pharmacy called [facility] as this is where it is filled. We can provide more samples if needed.  Please also refer her to Hematology due to IDA if she is willing.

## 2024-04-22 NOTE — Addendum Note (Signed)
 Addended by: SHIRLEAN THERISA ORN on: 04/22/2024 04:44 PM   Modules accepted: Orders

## 2024-04-23 ENCOUNTER — Telehealth: Payer: Self-pay

## 2024-04-23 ENCOUNTER — Other Ambulatory Visit: Payer: Self-pay

## 2024-04-23 DIAGNOSIS — K5909 Other constipation: Secondary | ICD-10-CM

## 2024-04-23 MED ORDER — IBSRELA 50 MG PO TABS
50.0000 mg | ORAL_TABLET | Freq: Two times a day (BID) | ORAL | Status: DC
Start: 2024-04-23 — End: 2024-05-29

## 2024-04-23 NOTE — Telephone Encounter (Signed)
 Documentation from [facility] regarding the IBSrela  you sent in for pt. Scanned to media

## 2024-04-23 NOTE — Progress Notes (Signed)
 Medication Samples have been provided to the patient.  Drug name: IBSRELA        Strength: 50MG         Qty: 4  LOT: [id number]  Exp.Date: 2025/08/30  Dosing instructions: 1 PILL X 2 A DAY  The patient has been instructed regarding the correct time, dose, and frequency of taking this medication, including desired effects and most common side effects.   Simone Tuckey I Virgie Chery 9:26 AM 04/23/2024

## 2024-04-23 NOTE — Telephone Encounter (Signed)
 Noted. It looks like patient assistance has been started with the program if we can just let her know.

## 2024-04-23 NOTE — Telephone Encounter (Signed)
 Dena, let me know if she willing for referral and can send if she is. Thanks!

## 2024-04-23 NOTE — Telephone Encounter (Signed)
 Noted

## 2024-04-24 NOTE — Telephone Encounter (Signed)
 Pt is willing to have referral done to Hematology.   Pt made aware of referral and IBSrela  documentation to help the pt to get medication

## 2024-04-24 NOTE — Addendum Note (Signed)
 Addended by: JEANELL GRAEME RAMAN on: 04/24/2024 01:48 PM   Modules accepted: Orders

## 2024-04-24 NOTE — Telephone Encounter (Signed)
 Referral placed.

## 2024-04-26 LAB — GLUCOSE, POCT (MANUAL RESULT ENTRY): Glucose Fasting, POC: 105 mg/dL — AB (ref 70–99)

## 2024-04-26 NOTE — Progress Notes (Signed)
 Pt has a pcp, has insurance, no sdoh and doesn't smoke.

## 2024-04-28 ENCOUNTER — Telehealth: Payer: Self-pay

## 2024-04-28 NOTE — Telephone Encounter (Signed)
 PA done on Cover My Meds for IBSrela  50mg  tab. Dx used: X41.1. pt has tried / failed: Lubiprostone  24 mcg and Liness 290 mcg. Pt ov notes were faxed as well. Waiting on a response.

## 2024-04-29 NOTE — Telephone Encounter (Signed)
 Phoned the pt and was advised of approval of IBSrela  04/28/2024---04/28/2025. Pt is aware and she has already spoke with [facility] and they are sending pt her medication. Her co-pay is $4.00.  Her appt with Hematology is October 10th.  Documentation for approval of IBSrela  scanned to the pt's chart

## 2024-04-29 NOTE — Telephone Encounter (Signed)
 Please refer to Hematology due to IDA.  Dawn Barnes  was approved. # [id number].  Be on the lookout for this documentation. It was sent to [facility]. I believe they will be sending to patient. Please reach out to [phone] (pharmacy) or have patient reach out to them to arrange delivery.

## 2024-05-08 NOTE — Progress Notes (Unsigned)
 [facility]   [phone] Fax:([phone]   Clinic New Consult Note   Patient Care Team: Carlette Benita Area, MD as PCP - General (Internal Medicine) Harvey Margo CROME, MD (Inactive) as Consulting Physician (Gastroenterology) Skeet Juliene SAUNDERS, DO as Consulting Physician (Neurology) 05/08/2024  CHIEF COMPLAINTS/PURPOSE OF CONSULTATION:  Anemia   REFERRING PHYSICIAN: GI Shirlean Therisa ORN, NP   Discussed the use of AI scribe software for clinical note transcription with the patient, who gave verbal consent to proceed.  History of Present Illness      MEDICAL HISTORY:  Past Medical History:  Diagnosis Date   Anemia    Anxiety    Constipation 04/30/2013   Depression    Diabetes mellitus without complication (HCC)    borderline   Fibroids, intramural 01/21/2016   GERD (gastroesophageal reflux disease)    Heartburn    Hot flashes 01/11/2016   Hypertension    Hypothyroidism    Mixed incontinence    Pancreatitis    PONV (postoperative nausea and vomiting)    Pre-diabetes    PTSD (post-traumatic stress disorder)    Thyroid  disease    Trauma    Urge urinary incontinence 05/10/2015   Vaginal bleeding 01/11/2016    SURGICAL HISTORY: Past Surgical History:  Procedure Laterality Date   BALLOON DILATION N/A 10/22/2023   Procedure: BALLOON DILATION;  Surgeon: [name] [name], DO;  [facility];  Service: Endoscopy;  Laterality: N/A;   BIOPSY  12/07/2015   Procedure: BIOPSY;  Surgeon: Margo CROME Harvey, MD;  [facility]: [facility];  Service: Endoscopy;;  right and left colon biopsies, gastric bx's    BLADDER REPAIR     COLONOSCOPY WITH PROPOFOL  N/A 12/07/2015   Dr. Harvey: mild proctitis, hyperplastic polyp    ENDOMETRIAL ABLATION     ESOPHAGOGASTRODUODENOSCOPY (EGD) WITH PROPOFOL  N/A 12/07/2015   Dr. Harvey: possible web in proximal esophagus s/p dilation, erosive gastritis (negative H.pylori).    ESOPHAGOGASTRODUODENOSCOPY (EGD) WITH  PROPOFOL  N/A 10/22/2023   Procedure: ESOPHAGOGASTRODUODENOSCOPY (EGD) WITH PROPOFOL ;  Surgeon: [name] [name], DO;  [facility];  Service: Endoscopy;  Laterality: N/A;  11:30 AM, ASA 3   FLEXIBLE SIGMOIDOSCOPY  02/19/2024   Procedure: SIGMOIDOSCOPY, FLEXIBLE;  Surgeon: [name] [name], DO;  [facility]: [facility];  Service: Endoscopy;;   INCONTINENCE SURGERY     KNEE ARTHROSCOPY WITH MEDIAL MENISECTOMY Left 11/17/2021   Procedure: KNEE ARTHROSCOPY WITH MEDIAL MENISECTOMY;  Surgeon: Onesimo Oneil LABOR, MD;  Location: [facility];  Service: Orthopedics;  Laterality: Left;   MULTIPLE TOOTH EXTRACTIONS     SAVORY DILATION N/A 12/07/2015   Procedure: SAVORY DILATION;  Surgeon: Margo CROME Harvey, MD;  [facility]: [facility];  Service: Endoscopy;  Laterality: N/A;   TUBAL LIGATION     TYMPANOPLASTY Right 01/28/2024   Procedure: TYMPANOPLASTY;  Surgeon: Jesus Oliphant, MD;  Location: [facility];  Service: ENT;  Laterality: Right;    SOCIAL HISTORY: Social History   Socioeconomic History   Marital status: Divorced    Spouse name: Not on file   Number of children: Not on file   Years of education: Not on file   Highest education level: Not on file  Occupational History   Occupation: disability  Tobacco Use   Smoking status: Never   Smokeless tobacco: Never  Vaping Use   Vaping status: Never Used  Substance and Sexual Activity   Alcohol use: No    Alcohol/week: 0.0 standard drinks of alcohol  Drug use: No   Sexual activity: Not Currently    Birth control/protection: Surgical    Comment: tubal and ablation  Other Topics Concern   Not on file  Social History Narrative   Are you right handed or left handed?    Are you currently employed ?    What is your current occupation?   Do you live at home alone?   Who lives with you?    What type of home do you live in: 1 story or 2 story? 1       Social Drivers of Corporate investment banker Strain: Low Risk  (11/02/2022)    Overall Financial Resource Strain (CARDIA)    Difficulty of Paying Living Expenses: Not hard at all  Food Insecurity: No Food Insecurity (04/26/2024)   Hunger Vital Sign    Worried About Running Out of Food in the Last Year: Never true    Ran Out of Food in the Last Year: Never true  Transportation Needs: No Transportation Needs (04/26/2024)   PRAPARE - Administrator, Civil Service (Medical): No    Lack of Transportation (Non-Medical): No  Physical Activity: Inactive (11/02/2022)   Exercise Vital Sign    Days of Exercise per Week: 0 days    Minutes of Exercise per Session: 10 min  Stress: Stress Concern Present (11/02/2022)   [employer] of Occupational Health - Occupational Stress Questionnaire    Feeling of Stress : To some extent  Social Connections: Socially Isolated (11/02/2022)   Social Connection and Isolation Panel    Frequency of Communication with Friends and Family: Three times a week    Frequency of Social Gatherings with Friends and Family: Once a week    Attends Religious Services: Never    Database administrator or Organizations: No    Attends Banker Meetings: Never    Marital Status: Divorced  Catering manager Violence: Not At Risk (04/26/2024)   Humiliation, Afraid, Rape, and Kick questionnaire    Fear of Current or Ex-Partner: No    Emotionally Abused: No    Physically Abused: No    Sexually Abused: No    FAMILY HISTORY: Family History  Problem Relation Age of Onset   Diabetes Father    Heart disease Father    Hypertension Sister    Heart disease Sister    Stroke Paternal Aunt    Dementia Paternal Aunt    Hypertension Paternal Aunt    Heart disease Paternal Aunt    Seizures Paternal Aunt    Breast cancer Paternal Aunt    Stroke Paternal Grandmother    Heart disease Paternal Grandmother    Depression Daughter    Depression Daughter    Depression Son    ADD / ADHD Son    Colon cancer Neg Hx    Colon polyps Neg Hx      ALLERGIES:  is allergic to ibuprofen, nsaids, and tuberculin tests.  MEDICATIONS:  Current Outpatient Medications  Medication Sig Dispense Refill   cetirizine (ZYRTEC) 10 MG tablet Take 10 mg by mouth daily.     Cholecalciferol (VITAMIN D -3) 125 MCG (5000 UT) TABS Take 5,000 Units by mouth daily.     cyanocobalamin  (VITAMIN B12) 1000 MCG tablet Take 1,000 mcg by mouth daily.     escitalopram (LEXAPRO) 20 MG tablet Take 20 mg by mouth daily.     hydrochlorothiazide (HYDRODIURIL) 12.5 MG tablet Take 12.5 mg by mouth daily.     levothyroxine  (  SYNTHROID ) 75 MCG tablet Take 1 tablet (75 mcg total) by mouth daily before breakfast. 90 tablet 3   lubiprostone  (AMITIZA ) 24 MCG capsule TAKE ONE CAPSULE BY MOUTH TWICE DAILY WITH MEALS 60 capsule 3   nitrofurantoin , macrocrystal-monohydrate, (MACROBID ) 100 MG capsule Take 1 capsule (100 mg total) by mouth 2 (two) times daily. 10 capsule 0   omeprazole (PRILOSEC) 20 MG capsule Take 40 mg by mouth daily.     polyethylene glycol (MIRALAX / GLYCOLAX) 17 g packet Take 17 g by mouth daily as needed for moderate constipation.     sulfamethoxazole -trimethoprim  (BACTRIM  DS) 800-160 MG tablet Take 1 tablet by mouth 2 (two) times daily.     Tenapanor HCl (IBSRELA ) 50 MG TABS Take 1 tablet by mouth 2 (two) times daily before a meal. Take just before eating 60 tablet 5   Tenapanor HCl (IBSRELA ) 50 MG TABS Take 50 mg by mouth in the morning and at bedtime.     TOVIAZ 8 MG TB24 tablet Take 8 mg by mouth daily.     No current facility-administered medications for this visit.    REVIEW OF SYSTEMS:   Constitutional: Denies fevers, chills or abnormal night sweats Eyes: Denies blurriness of vision, double vision or watery eyes Ears, nose, mouth, throat, and face: Denies mucositis or sore throat Respiratory: Denies cough, dyspnea or wheezes Cardiovascular: Denies palpitation, chest discomfort or lower extremity swelling Gastrointestinal:  Denies nausea, heartburn  or change in bowel habits Skin: Denies abnormal skin rashes Lymphatics: Denies new lymphadenopathy or easy bruising Neurological:Denies numbness, tingling or new weaknesses Behavioral/Psych: Mood is stable, no new changes  All other systems were reviewed with the patient and are negative.  PHYSICAL EXAMINATION: ECOG PERFORMANCE STATUS: {CHL ONC ECOG PS:[id number]}  There were no vitals filed for this visit. There were no vitals filed for this visit.  GENERAL:alert, no distress and comfortable SKIN: skin color, texture, turgor are normal, no rashes or significant lesions EYES: normal, conjunctiva are pink and non-injected, sclera clear OROPHARYNX:no exudate, no erythema and lips, buccal mucosa, and tongue normal  NECK: supple, thyroid  normal size, non-tender, without nodularity LYMPH:  no palpable lymphadenopathy in the cervical, axillary or inguinal LUNGS: clear to auscultation and percussion with normal breathing effort HEART: regular rate & rhythm and no murmurs and no lower extremity edema ABDOMEN:abdomen soft, non-tender and normal bowel sounds Musculoskeletal:no cyanosis of digits and no clubbing  PSYCH: alert & oriented x 3 with fluent speech NEURO: no focal motor/sensory deficits  Physical Exam   LABORATORY DATA:  I have reviewed the data as listed    Latest Ref Rng & Units 04/02/2024    1:54 PM 12/25/2023    3:17 PM 10/18/2023    1:02 PM  CBC  WBC 3.4 - 10.8 x10E3/uL 5.1  6.2  4.6   Hemoglobin 11.1 - 15.9 g/dL 87.5  87.4  88.0   Hematocrit 34.0 - 46.6 % 39.6  39.3  37.2   Platelets 150 - 450 x10E3/uL 308  287  238     @cmpl @  RADIOGRAPHIC STUDIES: I have personally reviewed the radiological images as listed and agreed with the findings in the report. No results found.  ASSESSMENT & PLAN:   Assessment and Plan Assessment & Plan      No orders of the defined types were placed in this encounter.   All questions were answered. The patient knows to call the  clinic with any problems, questions or concerns. I spent {CHL ONC TIME VISIT - DTPQU:[id number]}  counseling the patient face to face. The total time spent in the appointment was {CHL ONC TIME VISIT - DTPQU:[id number]} including review of chart and various tests results, discussions about plan of care and coordination of care plan.     Onita Mattock, MD 05/08/2024 9:51 PM

## 2024-05-09 ENCOUNTER — Ambulatory Visit: Payer: Self-pay | Admitting: Hematology

## 2024-05-09 ENCOUNTER — Encounter: Payer: Self-pay | Admitting: Hematology

## 2024-05-09 ENCOUNTER — Inpatient Hospital Stay

## 2024-05-09 ENCOUNTER — Inpatient Hospital Stay: Attending: Hematology | Admitting: Hematology

## 2024-05-09 ENCOUNTER — Other Ambulatory Visit: Payer: Self-pay | Admitting: Hematology

## 2024-05-09 VITALS — BP 133/86 | HR 64 | Temp 98.0°F | Resp 19 | Wt 199.0 lb

## 2024-05-09 DIAGNOSIS — Z803 Family history of malignant neoplasm of breast: Secondary | ICD-10-CM | POA: Diagnosis not present

## 2024-05-09 DIAGNOSIS — D649 Anemia, unspecified: Secondary | ICD-10-CM

## 2024-05-09 DIAGNOSIS — E039 Hypothyroidism, unspecified: Secondary | ICD-10-CM | POA: Diagnosis not present

## 2024-05-09 DIAGNOSIS — E538 Deficiency of other specified B group vitamins: Secondary | ICD-10-CM

## 2024-05-09 DIAGNOSIS — E119 Type 2 diabetes mellitus without complications: Secondary | ICD-10-CM | POA: Insufficient documentation

## 2024-05-09 DIAGNOSIS — Z79899 Other long term (current) drug therapy: Secondary | ICD-10-CM | POA: Insufficient documentation

## 2024-05-09 DIAGNOSIS — Z800 Family history of malignant neoplasm of digestive organs: Secondary | ICD-10-CM | POA: Diagnosis not present

## 2024-05-09 DIAGNOSIS — D509 Iron deficiency anemia, unspecified: Secondary | ICD-10-CM | POA: Insufficient documentation

## 2024-05-09 DIAGNOSIS — F418 Other specified anxiety disorders: Secondary | ICD-10-CM | POA: Diagnosis not present

## 2024-05-09 DIAGNOSIS — I10 Essential (primary) hypertension: Secondary | ICD-10-CM | POA: Diagnosis not present

## 2024-05-09 LAB — FOLATE: Folate: 5.4 ng/mL — ABNORMAL LOW (ref >5.9)

## 2024-05-09 LAB — RETICULOCYTES
Immature Retic Fract: 12.3 % (ref 2.3–15.9)
RBC.: 4.16 MIL/uL (ref 3.87–5.11)
Retic Count, Absolute: 38.3 K/uL (ref 19.0–186.0)
Retic Ct Pct: 0.9 % (ref 0.4–3.1)

## 2024-05-09 LAB — VITAMIN B12: Vitamin B-12: 1300 pg/mL — ABNORMAL HIGH (ref 180–914)

## 2024-05-09 MED ORDER — FOLIC ACID 1 MG PO TABS: 1.0000 mg | ORAL_TABLET | Freq: Every day | ORAL | 5 refills | Status: AC

## 2024-05-09 NOTE — Telephone Encounter (Signed)
 Patient called and aware of lab results. Patient aware of Folic Acid 1mg  at pharmacy and will take as direceted.

## 2024-05-13 LAB — CELIAC PANEL 10
Antigliadin Abs, IgA: 8 U (ref 0–19)
Endomysial Ab, IgA: NEGATIVE
Gliadin IgG: 3 U (ref 0–19)
IgA: 470 mg/dL — ABNORMAL HIGH (ref 87–352)
Tissue Transglut Ab: 3 U/mL (ref 0–5)
Tissue Transglutaminase Ab, IgA: <2 U/mL (ref 0–3)

## 2024-05-19 LAB — METHYLMALONIC ACID, SERUM: Methylmalonic Acid, Quantitative: 151 nmol/L (ref 0–378)

## 2024-05-29 ENCOUNTER — Telehealth: Payer: Self-pay | Admitting: *Deleted

## 2024-05-29 ENCOUNTER — Ambulatory Visit: Admitting: Gastroenterology

## 2024-05-29 ENCOUNTER — Encounter: Payer: Self-pay | Admitting: Gastroenterology

## 2024-05-29 VITALS — BP 121/79 | HR 76 | Temp 98.2°F | Ht 64.0 in | Wt 197.6 lb

## 2024-05-29 DIAGNOSIS — K5900 Constipation, unspecified: Secondary | ICD-10-CM

## 2024-05-29 DIAGNOSIS — D509 Iron deficiency anemia, unspecified: Secondary | ICD-10-CM

## 2024-05-29 MED ORDER — TRULANCE 3 MG PO TABS: 3.0000 mg | ORAL_TABLET | Freq: Every day | ORAL | Status: DC

## 2024-05-29 NOTE — Progress Notes (Signed)
 Gastroenterology Office Note     Primary Care Physician:  Carlette Benita Area, MD  Primary Gastroenterologist: Dr [name]   Chief Complaint   Chief Complaint  Patient presents with   Follow-up     History of Present Illness   Dawn Barnes is a 58 y.o. female presenting today with a history of GERD, chronic dysphagia, chronic constipation, hepatic steatosis, anemia with IDA component, inadequate prep on recent colonoscopy and needs to have early interval. Attempted colonoscopy July 2025. Last seen Sept 2025 and prescribed Ibsrela . Here for early interval follow-up and hopefully to arrange colonoscopy in near future.  Constipation: failing Linzess , Amitiza  24 mcg BID. Ibsrela  has been causing looser stools. She stopped taking this for awhile and reduced to just once per day yesterday and this morning. She hasn't had a BM yet. She initially did well with Ibsrela  but if not taking, she has no BM. She is not sure she wants to continue taking this. She hasn't been drinking increased water . Stomach will rumble after Ibsrela . Afraid to eat as she may have diarrhea with ibsrela . Doesn't want to continue this.   Nulytely was not palatable, felt nauseated.    Anemia with worsening ferritin in June 2025. Ferritin low at 18 in Sept 2025, iron sats low. Celiac negative. She was referred to Hematology due to recurrent IDA. She also has hx of  VIt B 12 deficiency.    Hepatic steatosis: not ideal elastography due to data quality, no obvious fibrosis. ELF was 9.32 ( need a second NIT to confirm). Transaminases normal. FIB-4 0.96, advanced fibrosis excluded. Fibrosure without fibrosis.    Colonoscopy July 2025: inadequate prep, needs repeat   EGD March 2025: small hiatal hernia, normal stomach and duodenum, empiric dilation performed.    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       Past Medical History:  Diagnosis Date   Anemia    Anxiety    Constipation 04/30/2013   Depression    Diabetes mellitus without complication (HCC)    borderline   Fibroids, intramural 01/21/2016   GERD (gastroesophageal reflux disease)    Heartburn    Hot flashes 01/11/2016   Hypertension    Hypothyroidism    Mixed incontinence    Pancreatitis    PONV (postoperative nausea and vomiting)    Pre-diabetes    PTSD (post-traumatic stress disorder)    Thyroid  disease    Trauma    Urge urinary incontinence 05/10/2015   Vaginal bleeding 01/11/2016    Past Surgical History:  Procedure Laterality Date   BALLOON DILATION N/A 10/22/2023   Procedure: BALLOON DILATION;  Surgeon: [name] [name], DO;  [facility];  Service: Endoscopy;  Laterality: N/A;   BIOPSY  12/07/2015   Procedure: BIOPSY;  Surgeon: Margo LITTIE Haddock, MD;  [facility]: [facility];  Service: Endoscopy;;  right and left colon biopsies, gastric bx's    BLADDER REPAIR     COLONOSCOPY WITH PROPOFOL  N/A 12/07/2015   Dr. Haddock: mild proctitis, hyperplastic polyp    ENDOMETRIAL ABLATION     ESOPHAGOGASTRODUODENOSCOPY (EGD) WITH PROPOFOL  N/A 12/07/2015   Dr. 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    ESOPHAGOGASTRODUODENOSCOPY (EGD) WITH PROPOFOL  N/A 10/22/2023   Procedure: ESOPHAGOGASTRODUODENOSCOPY (EGD) WITH PROPOFOL ;  Surgeon: [name] [name], DO;  [facility];  Service: Endoscopy;  Laterality: N/A;  11:30 AM,  ASA 3   FLEXIBLE SIGMOIDOSCOPY  02/19/2024   Procedure: KINGSTON SIDE;  Surgeon: [name] [name], DO;  [facility];  Service: Endoscopy;;   INCONTINENCE SURGERY     KNEE ARTHROSCOPY WITH MEDIAL MENISECTOMY Left 11/17/2021   Procedure: KNEE ARTHROSCOPY WITH MEDIAL MENISECTOMY;  Surgeon: Onesimo Oneil LABOR, MD;  Location: [facility];  Service: Orthopedics;  Laterality: Left;   MULTIPLE TOOTH EXTRACTIONS     SAVORY  DILATION N/A 12/07/2015   Procedure: SAVORY DILATION;  Surgeon: Margo LITTIE Haddock, MD;  [facility];  Service: Endoscopy;  Laterality: N/A;   TUBAL LIGATION     TYMPANOPLASTY Right 01/28/2024   Procedure: TYMPANOPLASTY;  Surgeon: Jesus Oliphant, MD;  Location: [facility];  Service: ENT;  Laterality: Right;    Current Outpatient Medications  Medication Sig Dispense Refill   Cholecalciferol (VITAMIN D -3) 125 MCG (5000 UT) TABS Take 5,000 Units by mouth daily.     cyanocobalamin  (VITAMIN B12) 1000 MCG tablet Take 1,000 mcg by mouth daily.     escitalopram (LEXAPRO) 20 MG tablet Take 20 mg by mouth daily.     folic acid (FOLVITE) 1 MG tablet Take 1 tablet (1 mg total) by mouth daily. 30 tablet 5   hydrochlorothiazide (HYDRODIURIL) 12.5 MG tablet Take 12.5 mg by mouth daily.     levothyroxine  (SYNTHROID ) 75 MCG tablet Take 1 tablet (75 mcg total) by mouth daily before breakfast. 90 tablet 3   omeprazole (PRILOSEC) 20 MG capsule Take 40 mg by mouth daily.     Tenapanor HCl (IBSRELA ) 50 MG TABS Take 50 mg by mouth in the morning and at bedtime.     TOVIAZ 8 MG TB24 tablet Take 8 mg by mouth daily.     No current facility-administered medications for this visit.    Allergies as of 05/29/2024 - Review Complete 05/29/2024  Allergen Reaction Noted   Ibuprofen Other (See Comments) 05/30/2021   Nsaids Other (See Comments) 01/17/2016   Tuberculin tests Dermatitis 01/18/2015    Family History  Problem Relation Age of Onset   Diabetes Father    Heart disease Father    Hypertension Sister    Heart disease Sister    Anemia Sister    Cancer Maternal Uncle        pancreatic cancer   Cancer Paternal Aunt        breast cancer   Stroke Paternal Aunt    Dementia Paternal Aunt    Hypertension Paternal Aunt    Heart disease Paternal Aunt    Seizures Paternal Aunt    Breast cancer Paternal Aunt    Stroke Paternal Grandmother    Heart disease Paternal Grandmother     Depression Daughter    Depression Daughter    Depression Son    ADD / ADHD Son    Colon cancer Neg Hx    Colon polyps Neg Hx     Social History   Socioeconomic History   Marital status: Divorced    Spouse name: Not on file   Number of children: Not on file   Years of education: Not on file   Highest education level: Not on file  Occupational History   Occupation: disability  Tobacco Use   Smoking status: Never   Smokeless tobacco: Never  Vaping Use   Vaping status: Never Used  Substance and Sexual Activity   Alcohol use: No    Alcohol/week: 0.0 standard drinks of alcohol   Drug use: No  Sexual activity: Not Currently    Birth control/protection: Surgical    Comment: tubal and ablation  Other Topics Concern   Not on file  Social History Narrative   Are you right handed or left handed?    Are you currently employed ?    What is your current occupation?   Do you live at home alone?   Who lives with you?    What type of home do you live in: 1 story or 2 story? 1       Social Drivers of Corporate Investment Banker Strain: Low Risk  (11/02/2022)   Overall Financial Resource Strain (CARDIA)    Difficulty of Paying Living Expenses: Not hard at all  Food Insecurity: No Food Insecurity (05/09/2024)   Hunger Vital Sign    Worried About Running Out of Food in the Last Year: Never true    Ran Out of Food in the Last Year: Never true  Transportation Needs: No Transportation Needs (05/09/2024)   PRAPARE - Administrator, Civil Service (Medical): No    Lack of Transportation (Non-Medical): No  Physical Activity: Inactive (11/02/2022)   Exercise Vital Sign    Days of Exercise per Week: 0 days    Minutes of Exercise per Session: 10 min  Stress: Stress Concern Present (11/02/2022)   [employer] of Occupational Health - Occupational Stress Questionnaire    Feeling of Stress : To some extent  Social Connections: Socially Isolated (11/02/2022)   Social Connection and  Isolation Panel    Frequency of Communication with Friends and Family: Three times a week    Frequency of Social Gatherings with Friends and Family: Once a week    Attends Religious Services: Never    Database Administrator or Organizations: No    Attends Banker Meetings: Never    Marital Status: Divorced  Catering Manager Violence: Not At Risk (05/09/2024)   Humiliation, Afraid, Rape, and Kick questionnaire    Fear of Current or Ex-Partner: No    Emotionally Abused: No    Physically Abused: No    Sexually Abused: No     Review of Systems   Gen: Denies any fever, chills, fatigue, weight loss, lack of appetite.  CV: Denies chest pain, heart palpitations, peripheral edema, syncope.  Resp: Denies shortness of breath at rest or with exertion. Denies wheezing or cough.  GI: Denies dysphagia or odynophagia. Denies jaundice, hematemesis, fecal incontinence. GU : Denies urinary burning, urinary frequency, urinary hesitancy MS: Denies joint pain, muscle weakness, cramps, or limitation of movement.  Derm: Denies rash, itching, dry skin Psych: Denies depression, anxiety, memory loss, and confusion Heme: Denies bruising, bleeding, and enlarged lymph nodes.   Physical Exam   BP 121/79 (BP Location: Right Arm, Patient Position: Sitting, Cuff Size: Large)   Pulse 76   Temp 98.2 F (36.8 C) (Oral)   Ht 5' 4 (1.626 m)   Wt 197 lb 9.6 oz (89.6 kg)   LMP  (LMP Unknown)   SpO2 96%   BMI 33.92 kg/m  General:   Alert and oriented. Pleasant and cooperative. Well-nourished and well-developed.  Head:  Normocephalic and atraumatic. Eyes:  Without icterus Abdomen:  +BS, soft, non-tender and non-distended. No HSM noted. No guarding or rebound. No masses appreciated.  Rectal:  Deferred  Msk:  Symmetrical without gross deformities. Normal posture. Extremities:  Without edema. Neurologic:  Alert and  oriented x4;  grossly normal neurologically. Skin:  Intact without significant  lesions or rashes. Psych:  Alert and cooperative. Normal mood and affect.   Assessment   Dawn Barnes is a 58 y.o. female presenting today with a history of GERD, chronic dysphagia, chronic constipation, hepatic steatosis without fibrosis, anemia with IDA component, inadequate prep on recent colonoscopy and needs to have early interval.   IDA: EGD March 2025 as above. Needs colonoscopy but had inadequate prep over the summer. We are maximizing bowel regimen to assist with this. Continue with Hematology visits. Colonoscopy in near future. May need capsule if colonoscopy negative.   Constipation: failed Linzess  and Amitiza  24 mcg BID. Ibsrela  BID overshooting the mark. She was hesitant to lower this to just once daily. Recommend Trulance daliy.   PLAN    Extra day of clear liquids prior to prep Proceed with colonoscopy by Dr. [name]  in near future: the risks, benefits, and alternatives have been discussed with the patient in detail. The patient states understanding and desires to proceed.  Trial of Trulance: no improvement after visit. She then resumed Ibsrela  but at once day dosing and is doing well with this.  Return in follow-up thereafter May need capsule if colonoscopy unrevealing due to IDA   Dawn MICAEL Stager, PhD, ANP-BC [facility]

## 2024-05-29 NOTE — Progress Notes (Signed)
 Medication Samples have been provided to the patient.  Drug name: Trulance       Strength: 3 mg        Qty: 3  LOT: [id number]  Exp.Date: 11/27/2024  Dosing instructions: Take one tablet daily  The patient has been instructed regarding the correct time, dose, and frequency of taking this medication, including desired effects and most common side effects.   Dawn Barnes Dawn Barnes 2:02 PM 05/29/2024

## 2024-05-29 NOTE — Telephone Encounter (Signed)
 PA pending [facility] for colonoscopy The prior authorization/notification reference number is: [id number].

## 2024-05-29 NOTE — H&P (View-Only) (Signed)
 Gastroenterology Office Note     Primary Care Physician:  Carlette Benita Area, MD  Primary Gastroenterologist: Dr [name]   Chief Complaint   Chief Complaint  Patient presents with   Follow-up     History of Present Illness   Dawn Barnes is a 58 y.o. female presenting today with a history of GERD, chronic dysphagia, chronic constipation, hepatic steatosis, anemia with IDA component, inadequate prep on recent colonoscopy and needs to have early interval. Attempted colonoscopy July 2025. Last seen Sept 2025 and prescribed Ibsrela . Here for early interval follow-up and hopefully to arrange colonoscopy in near future.  Constipation: failing Linzess , Amitiza  24 mcg BID. Ibsrela  has been causing looser stools. She stopped taking this for awhile and reduced to just once per day yesterday and this morning. She hasn't had a BM yet. She initially did well with Ibsrela  but if not taking, she has no BM. She is not sure she wants to continue taking this. She hasn't been drinking increased water . Stomach will rumble after Ibsrela . Afraid to eat as she may have diarrhea with ibsrela . Doesn't want to continue this.   Nulytely was not palatable, felt nauseated.    Anemia with worsening ferritin in June 2025. Ferritin low at 18 in Sept 2025, iron sats low. Celiac negative. She was referred to Hematology due to recurrent IDA. She also has hx of  VIt B 12 deficiency.    Hepatic steatosis: not ideal elastography due to data quality, no obvious fibrosis. ELF was 9.32 ( need a second NIT to confirm). Transaminases normal. FIB-4 0.96, advanced fibrosis excluded. Fibrosure without fibrosis.    Colonoscopy July 2025: inadequate prep, needs repeat   EGD March 2025: small hiatal hernia, normal stomach and duodenum, empiric dilation performed.    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       Past Medical History:  Diagnosis Date   Anemia    Anxiety    Constipation 04/30/2013   Depression    Diabetes mellitus without complication (HCC)    borderline   Fibroids, intramural 01/21/2016   GERD (gastroesophageal reflux disease)    Heartburn    Hot flashes 01/11/2016   Hypertension    Hypothyroidism    Mixed incontinence    Pancreatitis    PONV (postoperative nausea and vomiting)    Pre-diabetes    PTSD (post-traumatic stress disorder)    Thyroid  disease    Trauma    Urge urinary incontinence 05/10/2015   Vaginal bleeding 01/11/2016    Past Surgical History:  Procedure Laterality Date   BALLOON DILATION N/A 10/22/2023   Procedure: BALLOON DILATION;  Surgeon: [name] [name], DO;  Location: [facility];  Service: Endoscopy;  Laterality: N/A;   BIOPSY  12/07/2015   Procedure: BIOPSY;  Surgeon: Margo LITTIE Haddock, MD;  Location: [facility];  Service: Endoscopy;;  right and left colon biopsies, gastric bx's    BLADDER REPAIR     COLONOSCOPY WITH PROPOFOL  N/A 12/07/2015   Dr. Haddock: mild proctitis, hyperplastic polyp    ENDOMETRIAL ABLATION     ESOPHAGOGASTRODUODENOSCOPY (EGD) WITH PROPOFOL  N/A 12/07/2015   Dr. 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    ESOPHAGOGASTRODUODENOSCOPY (EGD) WITH PROPOFOL  N/A 10/22/2023   Procedure: ESOPHAGOGASTRODUODENOSCOPY (EGD) WITH PROPOFOL ;  Surgeon: [name] [name], DO;  Location: [facility];  Service: Endoscopy;  Laterality: N/A;  11:30 AM,  ASA 3   FLEXIBLE SIGMOIDOSCOPY  02/19/2024   Procedure: KINGSTON SIDE;  Surgeon: [name] [name], DO;  Location: [facility];  Service: Endoscopy;;   INCONTINENCE SURGERY     KNEE ARTHROSCOPY WITH MEDIAL MENISECTOMY Left 11/17/2021   Procedure: KNEE ARTHROSCOPY WITH MEDIAL MENISECTOMY;  Surgeon: Onesimo Oneil LABOR, MD;  Location: [facility];  Service: Orthopedics;  Laterality: Left;   MULTIPLE TOOTH EXTRACTIONS     SAVORY  DILATION N/A 12/07/2015   Procedure: SAVORY DILATION;  Surgeon: Margo LITTIE Haddock, MD;  Location: [facility];  Service: Endoscopy;  Laterality: N/A;   TUBAL LIGATION     TYMPANOPLASTY Right 01/28/2024   Procedure: TYMPANOPLASTY;  Surgeon: Jesus Oliphant, MD;  Location: [facility];  Service: ENT;  Laterality: Right;    Current Outpatient Medications  Medication Sig Dispense Refill   Cholecalciferol (VITAMIN D -3) 125 MCG (5000 UT) TABS Take 5,000 Units by mouth daily.     cyanocobalamin  (VITAMIN B12) 1000 MCG tablet Take 1,000 mcg by mouth daily.     escitalopram (LEXAPRO) 20 MG tablet Take 20 mg by mouth daily.     folic acid (FOLVITE) 1 MG tablet Take 1 tablet (1 mg total) by mouth daily. 30 tablet 5   hydrochlorothiazide (HYDRODIURIL) 12.5 MG tablet Take 12.5 mg by mouth daily.     levothyroxine  (SYNTHROID ) 75 MCG tablet Take 1 tablet (75 mcg total) by mouth daily before breakfast. 90 tablet 3   omeprazole (PRILOSEC) 20 MG capsule Take 40 mg by mouth daily.     Tenapanor HCl (IBSRELA ) 50 MG TABS Take 50 mg by mouth in the morning and at bedtime.     TOVIAZ 8 MG TB24 tablet Take 8 mg by mouth daily.     No current facility-administered medications for this visit.    Allergies as of 05/29/2024 - Review Complete 05/29/2024  Allergen Reaction Noted   Ibuprofen Other (See Comments) 05/30/2021   Nsaids Other (See Comments) 01/17/2016   Tuberculin tests Dermatitis 01/18/2015    Family History  Problem Relation Age of Onset   Diabetes Father    Heart disease Father    Hypertension Sister    Heart disease Sister    Anemia Sister    Cancer Maternal Uncle        pancreatic cancer   Cancer Paternal Aunt        breast cancer   Stroke Paternal Aunt    Dementia Paternal Aunt    Hypertension Paternal Aunt    Heart disease Paternal Aunt    Seizures Paternal Aunt    Breast cancer Paternal Aunt    Stroke Paternal Grandmother    Heart disease Paternal Grandmother     Depression Daughter    Depression Daughter    Depression Son    ADD / ADHD Son    Colon cancer Neg Hx    Colon polyps Neg Hx     Social History   Socioeconomic History   Marital status: Divorced    Spouse name: Not on file   Number of children: Not on file   Years of education: Not on file   Highest education level: Not on file  Occupational History   Occupation: disability  Tobacco Use   Smoking status: Never   Smokeless tobacco: Never  Vaping Use   Vaping status: Never Used  Substance and Sexual Activity   Alcohol use: No    Alcohol/week: 0.0 standard drinks of alcohol   Drug use: No  Sexual activity: Not Currently    Birth control/protection: Surgical    Comment: tubal and ablation  Other Topics Concern   Not on file  Social History Narrative   Are you right handed or left handed?    Are you currently employed ?    What is your current occupation?   Do you live at home alone?   Who lives with you?    What type of home do you live in: 1 story or 2 story? 1       Social Drivers of Corporate Investment Banker Strain: Low Risk  (11/02/2022)   Overall Financial Resource Strain (CARDIA)    Difficulty of Paying Living Expenses: Not hard at all  Food Insecurity: No Food Insecurity (05/09/2024)   Hunger Vital Sign    Worried About Running Out of Food in the Last Year: Never true    Ran Out of Food in the Last Year: Never true  Transportation Needs: No Transportation Needs (05/09/2024)   PRAPARE - Administrator, Civil Service (Medical): No    Lack of Transportation (Non-Medical): No  Physical Activity: Inactive (11/02/2022)   Exercise Vital Sign    Days of Exercise per Week: 0 days    Minutes of Exercise per Session: 10 min  Stress: Stress Concern Present (11/02/2022)   [employer] of Occupational Health - Occupational Stress Questionnaire    Feeling of Stress : To some extent  Social Connections: Socially Isolated (11/02/2022)   Social Connection and  Isolation Panel    Frequency of Communication with Friends and Family: Three times a week    Frequency of Social Gatherings with Friends and Family: Once a week    Attends Religious Services: Never    Database Administrator or Organizations: No    Attends Banker Meetings: Never    Marital Status: Divorced  Catering Manager Violence: Not At Risk (05/09/2024)   Humiliation, Afraid, Rape, and Kick questionnaire    Fear of Current or Ex-Partner: No    Emotionally Abused: No    Physically Abused: No    Sexually Abused: No     Review of Systems   Gen: Denies any fever, chills, fatigue, weight loss, lack of appetite.  CV: Denies chest pain, heart palpitations, peripheral edema, syncope.  Resp: Denies shortness of breath at rest or with exertion. Denies wheezing or cough.  GI: Denies dysphagia or odynophagia. Denies jaundice, hematemesis, fecal incontinence. GU : Denies urinary burning, urinary frequency, urinary hesitancy MS: Denies joint pain, muscle weakness, cramps, or limitation of movement.  Derm: Denies rash, itching, dry skin Psych: Denies depression, anxiety, memory loss, and confusion Heme: Denies bruising, bleeding, and enlarged lymph nodes.   Physical Exam   BP 121/79 (BP Location: Right Arm, Patient Position: Sitting, Cuff Size: Large)   Pulse 76   Temp 98.2 F (36.8 C) (Oral)   Ht 5' 4 (1.626 m)   Wt 197 lb 9.6 oz (89.6 kg)   LMP  (LMP Unknown)   SpO2 96%   BMI 33.92 kg/m  General:   Alert and oriented. Pleasant and cooperative. Well-nourished and well-developed.  Head:  Normocephalic and atraumatic. Eyes:  Without icterus Abdomen:  +BS, soft, non-tender and non-distended. No HSM noted. No guarding or rebound. No masses appreciated.  Rectal:  Deferred  Msk:  Symmetrical without gross deformities. Normal posture. Extremities:  Without edema. Neurologic:  Alert and  oriented x4;  grossly normal neurologically. Skin:  Intact without significant  lesions or rashes. Psych:  Alert and cooperative. Normal mood and affect.   Assessment   Dawn Barnes is a 58 y.o. female presenting today with a history of GERD, chronic dysphagia, chronic constipation, hepatic steatosis without fibrosis, anemia with IDA component, inadequate prep on recent colonoscopy and needs to have early interval.   IDA: EGD March 2025 as above. Needs colonoscopy but had inadequate prep over the summer. We are maximizing bowel regimen to assist with this. Continue with Hematology visits. Colonoscopy in near future. May need capsule if colonoscopy negative.   Constipation: failed Linzess  and Amitiza  24 mcg BID. Ibsrela  BID overshooting the mark. She was hesitant to lower this to just once daily. Recommend Trulance daliy.   PLAN    Extra day of clear liquids prior to prep Proceed with colonoscopy by Dr. [name]  in near future: the risks, benefits, and alternatives have been discussed with the patient in detail. The patient states understanding and desires to proceed.  Trial of Trulance: no improvement after visit. She then resumed Ibsrela  but at once day dosing and is doing well with this.  Return in follow-up thereafter May need capsule if colonoscopy unrevealing due to IDA   Therisa MICAEL Stager, PhD, ANP-BC [facility]

## 2024-05-29 NOTE — Patient Instructions (Signed)
 Let's stop Ibsrela . Instead, let's try Trulance once daily. Let me know how this works for you! We will need to send in a prescription if this works well.   We are arranging a colonoscopy in the near future with Dr. [name].   We will see you in follow-up after that!  I enjoyed seeing you again today! I value our relationship and want to provide genuine, compassionate, and quality care. You may receive a survey regarding your visit with me, and I welcome your feedback! Thanks so much for taking the time to complete this. I look forward to seeing you again.      Therisa MICAEL Stager, PhD, ANP-BC [facility]

## 2024-06-04 ENCOUNTER — Telehealth: Payer: Self-pay

## 2024-06-04 ENCOUNTER — Inpatient Hospital Stay: Attending: Oncology | Admitting: Oncology

## 2024-06-04 VITALS — BP 131/82 | HR 69 | Temp 97.1°F | Resp 18 | Ht 64.0 in | Wt 197.0 lb

## 2024-06-04 DIAGNOSIS — D509 Iron deficiency anemia, unspecified: Secondary | ICD-10-CM | POA: Insufficient documentation

## 2024-06-04 DIAGNOSIS — Z79899 Other long term (current) drug therapy: Secondary | ICD-10-CM | POA: Insufficient documentation

## 2024-06-04 DIAGNOSIS — D649 Anemia, unspecified: Secondary | ICD-10-CM | POA: Diagnosis not present

## 2024-06-04 DIAGNOSIS — E538 Deficiency of other specified B group vitamins: Secondary | ICD-10-CM

## 2024-06-04 DIAGNOSIS — E039 Hypothyroidism, unspecified: Secondary | ICD-10-CM | POA: Insufficient documentation

## 2024-06-04 DIAGNOSIS — D519 Vitamin B12 deficiency anemia, unspecified: Secondary | ICD-10-CM | POA: Insufficient documentation

## 2024-06-04 NOTE — Telephone Encounter (Signed)
 FYI:  Returned the pt 's call and was advised by the pt that the Trulance has not worked. I asked her how many BM's has she had and she said none. I advised the pt that we have tried numerous things on her and she is failing them all. So I asked her how much water  does she drink she none. I asked her what does she drink on a daily she said soda.I advised the pt to drink at least 4 bottles of water  starting today and call or MyChart me Friday to see how things worked. I told the pt that water  must be consumed because she is putting all that constipation meds in and nothing is happening. She is scheduled for a colonoscopy on the 18th of this month and at this rate it will not be a clean one so I'm trying to help her to get cleaned out. Please advise

## 2024-06-04 NOTE — Telephone Encounter (Signed)
 Let's have her try taking the Ibsrela  just once a day. Twice a day caused looser stool.  Let us  know how she does with this. We might have to postpone colonoscopy if no improvement.   Agree with increasing water  intake.

## 2024-06-04 NOTE — Progress Notes (Signed)
 [facility]   [phone] [phone]   Clinic New Consult Note   Patient Care Team: Carlette Benita Area, MD as PCP - General (Internal Medicine) Harvey Margo CROME, MD (Inactive) as Consulting Physician (Gastroenterology) Skeet Juliene SAUNDERS, DO as Consulting Physician (Neurology) 06/04/2024  CHIEF COMPLAINTS/PURPOSE OF CONSULTATION:  Anemia   REFERRING PHYSICIAN: GI Shirlean Therisa ORN, NP   Discussed the use of AI scribe software for clinical note transcription with the patient, who gave verbal consent to proceed.  History of Present Illness Dawn Barnes is a 58 year old female who presents for evaluation of anemia. She was referred by her GI NP Therisa for evaluation of anemia.  She experiences occasional dark stools, which she associates with her gallbladder surgery in April. She reports low energy levels and fatigue, worsening over the past few months, and feels cold more often. She struggles to complete daily activities due to lack of energy.  She has a history of vitamin B12 deficiency and takes an over-the-counter B12 supplement daily. Her last B12 level check was in 2024, and she has not had any B12 injections.  Iron study a month ago showed a normal serum iron and TIBC, slightly low iron saturation 12%.  Ferritin was 18.  She has not taken any iron pills or IV iron.  Family history is significant for  various cancers, including breast cancer on her father's side and other unspecified cancers on both sides of the family. She does not smoke or drink alcohol and lives alone.  Reports overall feeling fair.  She has been taking B12 and folic acid supplements as prescribed.  Reports she continues to have numbness in her legs feet and thighs.  She denies any bright red blood per rectum, melena or hematochezia.  She is scheduled for a colonoscopy on 06/17/2024 to evaluate.  Reports persistent fatigue despite supplementation.     MEDICAL HISTORY:  Past Medical  History:  Diagnosis Date   Anemia    Anxiety    Constipation 04/30/2013   Depression    Diabetes mellitus without complication (HCC)    borderline   Fibroids, intramural 01/21/2016   GERD (gastroesophageal reflux disease)    Heartburn    Hot flashes 01/11/2016   Hypertension    Hypothyroidism    Mixed incontinence    Pancreatitis    PONV (postoperative nausea and vomiting)    Pre-diabetes    PTSD (post-traumatic stress disorder)    Thyroid  disease    Trauma    Urge urinary incontinence 05/10/2015   Vaginal bleeding 01/11/2016    SURGICAL HISTORY: Past Surgical History:  Procedure Laterality Date   BALLOON DILATION N/A 10/22/2023   Procedure: BALLOON DILATION;  Surgeon: [name] [name], DO;  [facility];  Service: Endoscopy;  Laterality: N/A;   BIOPSY  12/07/2015   Procedure: BIOPSY;  Surgeon: Margo CROME Harvey, MD;  [facility];  Service: Endoscopy;;  right and left colon biopsies, gastric bx's    BLADDER REPAIR     COLONOSCOPY WITH PROPOFOL  N/A 12/07/2015   Dr. Harvey: mild proctitis, hyperplastic polyp    ENDOMETRIAL ABLATION     ESOPHAGOGASTRODUODENOSCOPY (EGD) WITH PROPOFOL  N/A 12/07/2015   Dr. Harvey: possible web in proximal esophagus s/p dilation, erosive gastritis (negative H.pylori).    ESOPHAGOGASTRODUODENOSCOPY (EGD) WITH PROPOFOL  N/A 10/22/2023   Procedure: ESOPHAGOGASTRODUODENOSCOPY (EGD) WITH PROPOFOL ;  Surgeon: [name] [name], DO;  [facility];  Service: Endoscopy;  Laterality: N/A;  11:30 AM,  ASA 3   FLEXIBLE SIGMOIDOSCOPY  02/19/2024   Procedure: KINGSTON SIDE;  Surgeon: [name] [name], DO;  [facility];  Service: Endoscopy;;   INCONTINENCE SURGERY     KNEE ARTHROSCOPY WITH MEDIAL MENISECTOMY Left 11/17/2021   Procedure: KNEE ARTHROSCOPY WITH MEDIAL MENISECTOMY;  Surgeon: Onesimo Oneil LABOR, MD;  Location: [facility];  Service: Orthopedics;  Laterality: Left;   MULTIPLE TOOTH EXTRACTIONS     SAVORY  DILATION N/A 12/07/2015   Procedure: SAVORY DILATION;  Surgeon: Margo LITTIE Haddock, MD;  [facility];  Service: Endoscopy;  Laterality: N/A;   TUBAL LIGATION     TYMPANOPLASTY Right 01/28/2024   Procedure: TYMPANOPLASTY;  Surgeon: Jesus Oliphant, MD;  Location: [facility];  Service: ENT;  Laterality: Right;    SOCIAL HISTORY: Social History   Socioeconomic History   Marital status: Divorced    Spouse name: Not on file   Number of children: Not on file   Years of education: Not on file   Highest education level: Not on file  Occupational History   Occupation: disability  Tobacco Use   Smoking status: Never   Smokeless tobacco: Never  Vaping Use   Vaping status: Never Used  Substance and Sexual Activity   Alcohol use: No    Alcohol/week: 0.0 standard drinks of alcohol   Drug use: No   Sexual activity: Not Currently    Birth control/protection: Surgical    Comment: tubal and ablation  Other Topics Concern   Not on file  Social History Narrative   Are you right handed or left handed?    Are you currently employed ?    What is your current occupation?   Do you live at home alone?   Who lives with you?    What type of home do you live in: 1 story or 2 story? 1       Social Drivers of Corporate Investment Banker Strain: Low Risk  (11/02/2022)   Overall Financial Resource Strain (CARDIA)    Difficulty of Paying Living Expenses: Not hard at all  Food Insecurity: No Food Insecurity (05/09/2024)   Hunger Vital Sign    Worried About Running Out of Food in the Last Year: Never true    Ran Out of Food in the Last Year: Never true  Transportation Needs: No Transportation Needs (05/09/2024)   PRAPARE - Administrator, Civil Service (Medical): No    Lack of Transportation (Non-Medical): No  Physical Activity: Inactive (11/02/2022)   Exercise Vital Sign    Days of Exercise per Week: 0 days    Minutes of Exercise per Session: 10 min  Stress: Stress  Concern Present (11/02/2022)   [employer] of Occupational Health - Occupational Stress Questionnaire    Feeling of Stress : To some extent  Social Connections: Socially Isolated (11/02/2022)   Social Connection and Isolation Panel    Frequency of Communication with Friends and Family: Three times a week    Frequency of Social Gatherings with Friends and Family: Once a week    Attends Religious Services: Never    Database Administrator or Organizations: No    Attends Banker Meetings: Never    Marital Status: Divorced  Catering Manager Violence: Not At Risk (05/09/2024)   Humiliation, Afraid, Rape, and Kick questionnaire    Fear of Current or Ex-Partner: No    Emotionally Abused: No    Physically Abused: No  Sexually Abused: No    FAMILY HISTORY: Family History  Problem Relation Age of Onset   Diabetes Father    Heart disease Father    Hypertension Sister    Heart disease Sister    Anemia Sister    Cancer Maternal Uncle        pancreatic cancer   Cancer Paternal Aunt        breast cancer   Stroke Paternal Aunt    Dementia Paternal Aunt    Hypertension Paternal Aunt    Heart disease Paternal Aunt    Seizures Paternal Aunt    Breast cancer Paternal Aunt    Stroke Paternal Grandmother    Heart disease Paternal Grandmother    Depression Daughter    Depression Daughter    Depression Son    ADD / ADHD Son    Colon cancer Neg Hx    Colon polyps Neg Hx     ALLERGIES:  is allergic to ibuprofen, nsaids, and tuberculin tests.  MEDICATIONS:  Current Outpatient Medications  Medication Sig Dispense Refill   cetirizine (ZYRTEC) 10 MG tablet Take 10 mg by mouth daily.     Cholecalciferol (VITAMIN D -3) 125 MCG (5000 UT) TABS Take 5,000 Units by mouth daily.     cyanocobalamin  (VITAMIN B12) 1000 MCG tablet Take 1,000 mcg by mouth daily.     escitalopram (LEXAPRO) 20 MG tablet Take 20 mg by mouth daily.     folic acid (FOLVITE) 1 MG tablet Take 1 tablet (1 mg  total) by mouth daily. 30 tablet 5   hydrochlorothiazide (HYDRODIURIL) 12.5 MG tablet Take 12.5 mg by mouth daily.     levothyroxine  (SYNTHROID ) 75 MCG tablet Take 1 tablet (75 mcg total) by mouth daily before breakfast. 90 tablet 3   omeprazole (PRILOSEC) 20 MG capsule Take 40 mg by mouth daily.     Plecanatide (TRULANCE) 3 MG TABS Take 1 tablet (3 mg total) by mouth daily.     TOVIAZ 8 MG TB24 tablet Take 8 mg by mouth daily.     No current facility-administered medications for this visit.    REVIEW OF SYSTEMS:   Review of Systems  Constitutional:  Positive for malaise/fatigue and weight loss.  Cardiovascular:  Positive for chest pain and palpitations.  Gastrointestinal:  Positive for constipation.  Genitourinary:  Positive for dysuria.  Neurological:  Positive for tingling, sensory change and headaches.     PHYSICAL EXAMINATION: ECOG PERFORMANCE STATUS: 1 - Symptomatic but completely ambulatory  Vitals:   06/04/24 1118  BP: 131/82  Pulse: 69  Resp: 18  Temp: (!) 97.1 F (36.2 C)  SpO2: 100%   Filed Weights   06/04/24 1118  Weight: 197 lb (89.4 kg)    Physical Exam Constitutional:      Appearance: Normal appearance.  Cardiovascular:     Rate and Rhythm: Normal rate and regular rhythm.  Pulmonary:     Effort: Pulmonary effort is normal.     Breath sounds: Normal breath sounds.  Abdominal:     General: Bowel sounds are normal.     Palpations: Abdomen is soft.  Musculoskeletal:        General: No swelling. Normal range of motion.  Neurological:     Mental Status: She is alert and oriented to person, place, and time. Mental status is at baseline.      Physical Exam   LABORATORY DATA:  I have reviewed the data as listed    Latest Ref Rng & Units 04/02/2024  1:54 PM 12/25/2023    3:17 PM 10/18/2023    1:02 PM  CBC  WBC 3.4 - 10.8 x10E3/uL 5.1  6.2  4.6   Hemoglobin 11.1 - 15.9 g/dL 87.5  87.4  88.0   Hematocrit 34.0 - 46.6 % 39.6  39.3  37.2   Platelets  150 - 450 x10E3/uL 308  287  238        Latest Ref Rng & Units 02/11/2024   12:01 PM 01/21/2024    1:30 PM 12/25/2023    3:17 PM  CMP  Glucose 70 - 99 mg/dL 892  899    BUN 6 - 20 mg/dL 10  12    Creatinine 9.55 - 1.00 mg/dL 8.96  9.11    Sodium 864 - 145 mmol/L 139  140    Potassium 3.5 - 5.1 mmol/L 3.4  4.1    Chloride 98 - 111 mmol/L 101  105    CO2 22 - 32 mmol/L 25  25    Calcium 8.9 - 10.3 mg/dL 9.3  9.0    Total Protein 6.0 - 8.5 g/dL   7.1   Total Bilirubin 0.0 - 1.2 mg/dL   <9.7   Alkaline Phos 44 - 121 IU/L   95   AST 0 - 40 IU/L   19   ALT 0 - 32 IU/L   15      RADIOGRAPHIC STUDIES: I have personally reviewed the radiological images as listed and agreed with the findings in the report. No results found.   Assessment & Plan Anemia with iron and vitamin B12 deficiency Mild intermittent anemia with iron and vitamin B12 deficiency. Recent labs show slightly low iron and low B12 levels in 2024. She reports fatigue, likely related to B12 deficiency. Post-menopausal status suggests atypical iron deficiency, warranting further investigation for potential causes, including celiac disease. Gastrointestinal health is under evaluation by her GI specialist. Celiac panel was negative. Repeat B12 and MMA levels show no evidence of B12 deficiency at this time.  Continue B12 supplements. Folate level low.  She started on folic acid 1 mg daily. She is currently not taking iron supplements but is interested in IV iron. Discussed 1 g INFeD through her IV.  We discussed side effects of iron infusions including rare cases of anaphylaxis. Recheck labs in approximately 3 to 4 months. Until then, she will continue folic acid and B12 supplements.   Orders Placed This Encounter  Procedures   Vitamin B12    Standing Status:   Future    Expected Date:   10/02/2024    Expiration Date:   12/31/2024   Methylmalonic acid, serum    Standing Status:   Future    Expected Date:   10/02/2024     Expiration Date:   12/31/2024   Folate, Serum    Standing Status:   Future    Expected Date:   10/02/2024    Expiration Date:   12/31/2024   CBC with Differential    Standing Status:   Future    Expected Date:   10/02/2024    Expiration Date:   12/31/2024   Iron and TIBC ([facility] ONLY)    Standing Status:   Future    Expected Date:   10/02/2024    Expiration Date:   12/31/2024   Ferritin    Standing Status:   Future    Expected Date:   10/02/2024    Expiration Date:   12/31/2024    All questions were  answered. The patient knows to call the clinic with any problems, questions or concerns. I spent 25 minutes counseling the patient face to face. The total time spent in the appointment was 30 minutes including review of chart and various tests results, discussions about plan of care and coordination of care plan.     Delon FORBES Hope, NP 06/04/2024 12:47 PM

## 2024-06-05 NOTE — Progress Notes (Signed)
 Pt attended 04/26/2024 screening event with BP of 125/88 and blood sugar was 105. Pt noted at event that she does have a PCP. At event pt did not indicate any SDOH needs. Pt also noted that she is not a smoker and listed Medicaid as her insurance at the event.   Per chart review pt does have a PCP (Tesfaye D.Fanta) , insurance, and is not a smoker. Pt's last appt was 10/25/2023. Pt does not indicate any SDOH needs at this time.  No additional pt f/u to be scheduled at this time per health equity protocol.

## 2024-06-06 NOTE — Telephone Encounter (Signed)
 Phoned and advised the pt of your recommendations. Pt expressed understanding and agrees to advise us  next week.

## 2024-06-06 NOTE — Telephone Encounter (Signed)
 PA approved, DOS: 06/17/24-09/15/24

## 2024-06-09 ENCOUNTER — Inpatient Hospital Stay

## 2024-06-09 VITALS — BP 128/84 | HR 73 | Temp 97.8°F | Resp 18

## 2024-06-09 DIAGNOSIS — D508 Other iron deficiency anemias: Secondary | ICD-10-CM

## 2024-06-09 MED ORDER — METHYLPREDNISOLONE SODIUM SUCC 125 MG IJ SOLR
125.0000 mg | Freq: Once | INTRAMUSCULAR | Status: AC
  Administered 2024-06-09: 125 mg via INTRAVENOUS
  Filled 2024-06-09: qty 2

## 2024-06-09 MED ORDER — SODIUM CHLORIDE 0.9 % IV SOLN: INTRAVENOUS | Status: DC

## 2024-06-09 MED ORDER — SODIUM CHLORIDE 0.9 % IV SOLN
50.0000 mg | Freq: Once | INTRAVENOUS | Status: AC
  Administered 2024-06-09: 50 mg via INTRAVENOUS
  Filled 2024-06-09: qty 1

## 2024-06-09 MED ORDER — SODIUM CHLORIDE 0.9 % IV SOLN
950.0000 mg | Freq: Once | INTRAVENOUS | Status: AC
  Administered 2024-06-09: 950 mg via INTRAVENOUS
  Filled 2024-06-09: qty 19

## 2024-06-09 MED ORDER — ACETAMINOPHEN 325 MG PO TABS
650.0000 mg | ORAL_TABLET | Freq: Once | ORAL | Status: AC
  Administered 2024-06-09: 650 mg via ORAL
  Filled 2024-06-09: qty 2

## 2024-06-09 MED ORDER — CETIRIZINE HCL 10 MG/ML IV SOLN
10.0000 mg | Freq: Once | INTRAVENOUS | Status: AC
  Administered 2024-06-09: 5 mg via INTRAVENOUS
  Filled 2024-06-09: qty 1

## 2024-06-09 MED ORDER — FAMOTIDINE IN NACL 20-0.9 MG/50ML-% IV SOLN
20.0000 mg | Freq: Once | INTRAVENOUS | Status: AC
  Administered 2024-06-09: 20 mg via INTRAVENOUS
  Filled 2024-06-09: qty 50

## 2024-06-09 NOTE — Progress Notes (Signed)
 Patient presents today for first iron infusion.  Patient is in satisfactory condition with no new complaints voiced.  Vital signs are stable.  We will proceed with infusion per provider orders.    Patient will receive 5mg  of Cetirizine per Niels Molt, PharmD  instead of 10 mg due to patient taking Zyrtec PO with her morning medications.   Infed 1,000 mg given today per MD orders. Tolerated infusion without adverse affects. Vital signs stable. No complaints at this time. Discharged from clinic ambulatory in stable condition. Alert and oriented x 3. F/U with [facility] as scheduled.

## 2024-06-09 NOTE — Patient Instructions (Signed)
 [facility] [place] - A DEPT OF [facility]  Discharge Instructions: Thank you for choosing [facility] to provide your oncology and hematology care.  If you have a lab appointment with the Cancer Center - please note that after April 8th, 2024, all labs will be drawn in the cancer center.  You do not have to check in or register with the main entrance as you have in the past but will complete your check-in in the cancer center.  Wear comfortable clothing and clothing appropriate for easy access to any Portacath or PICC line.   We strive to give you quality time with your provider. You may need to reschedule your appointment if you arrive late (15 or more minutes).  Arriving late affects you and other patients whose appointments are after yours.  Also, if you miss three or more appointments without notifying the office, you may be dismissed from the clinic at the provider's discretion.      For prescription refill requests, have your pharmacy contact our office and allow 72 hours for refills to be completed.    Today you received Infed IV iron infusion.    BELOW ARE SYMPTOMS THAT SHOULD BE REPORTED IMMEDIATELY: *FEVER GREATER THAN 100.4 F (38 C) OR HIGHER *CHILLS OR SWEATING *NAUSEA AND VOMITING THAT IS NOT CONTROLLED WITH YOUR NAUSEA MEDICATION *UNUSUAL SHORTNESS OF BREATH *UNUSUAL BRUISING OR BLEEDING *URINARY PROBLEMS (pain or burning when urinating, or frequent urination) *BOWEL PROBLEMS (unusual diarrhea, constipation, pain near the anus) TENDERNESS IN MOUTH AND THROAT WITH OR WITHOUT PRESENCE OF ULCERS (sore throat, sores in mouth, or a toothache) UNUSUAL RASH, SWELLING OR PAIN  UNUSUAL VAGINAL DISCHARGE OR ITCHING   Items with * indicate a potential emergency and should be followed up as soon as possible or go to the Emergency Department if any problems should occur.  Please show the CHEMOTHERAPY ALERT CARD or IMMUNOTHERAPY ALERT CARD at  check-in to the Emergency Department and triage nurse.  Should you have questions after your visit or need to cancel or reschedule your appointment, please contact [facility] [place] - A DEPT OF [facility] [phone]  and follow the prompts.  Office hours are 8:00 a.m. to 4:30 p.m. Monday - Friday. Please note that voicemails left after 4:00 p.m. may not be returned until the following business day.  We are closed weekends and major holidays. You have access to a nurse at all times for urgent questions. Please call the main number to the clinic [phone] and follow the prompts.  For any non-urgent questions, you may also contact your provider using MyChart. We now offer e-Visits for anyone 42 and older to request care online for non-urgent symptoms. For details visit [email or web address].   Also download the MyChart app! Go to the app store, search "MyChart", open the app, select [facility], and log in with your MyChart username and password.

## 2024-06-10 NOTE — Telephone Encounter (Signed)
 Noted

## 2024-06-10 NOTE — Telephone Encounter (Signed)
 Dawn Barnes,  Pt phoned to advise that changing the IBSrela  to once a day really helped with the constipation. She is having good BM's now.

## 2024-06-10 NOTE — Telephone Encounter (Signed)
 Great!

## 2024-06-17 ENCOUNTER — Ambulatory Visit (HOSPITAL_COMMUNITY)
Admission: RE | Admit: 2024-06-17 | Discharge: 2024-06-17 | Disposition: A | Discharge status: Home / Self Care | Attending: Internal Medicine | Admitting: Internal Medicine

## 2024-06-17 ENCOUNTER — Other Ambulatory Visit: Payer: Self-pay

## 2024-06-17 ENCOUNTER — Ambulatory Visit (HOSPITAL_COMMUNITY): Admitting: Anesthesiology

## 2024-06-17 ENCOUNTER — Encounter (HOSPITAL_COMMUNITY): Payer: Self-pay | Admitting: Internal Medicine

## 2024-06-17 ENCOUNTER — Encounter (HOSPITAL_COMMUNITY)
Admission: RE | Disposition: A | Discharge status: Home / Self Care | Payer: Self-pay | Source: Home / Self Care | Attending: Internal Medicine

## 2024-06-17 DIAGNOSIS — I10 Essential (primary) hypertension: Secondary | ICD-10-CM | POA: Insufficient documentation

## 2024-06-17 DIAGNOSIS — F32A Depression, unspecified: Secondary | ICD-10-CM | POA: Diagnosis not present

## 2024-06-17 DIAGNOSIS — F419 Anxiety disorder, unspecified: Secondary | ICD-10-CM | POA: Insufficient documentation

## 2024-06-17 DIAGNOSIS — D122 Benign neoplasm of ascending colon: Secondary | ICD-10-CM

## 2024-06-17 DIAGNOSIS — K648 Other hemorrhoids: Secondary | ICD-10-CM

## 2024-06-17 DIAGNOSIS — K219 Gastro-esophageal reflux disease without esophagitis: Secondary | ICD-10-CM | POA: Insufficient documentation

## 2024-06-17 DIAGNOSIS — D120 Benign neoplasm of cecum: Secondary | ICD-10-CM | POA: Diagnosis not present

## 2024-06-17 DIAGNOSIS — E119 Type 2 diabetes mellitus without complications: Secondary | ICD-10-CM | POA: Diagnosis not present

## 2024-06-17 DIAGNOSIS — D509 Iron deficiency anemia, unspecified: Secondary | ICD-10-CM

## 2024-06-17 DIAGNOSIS — Z79899 Other long term (current) drug therapy: Secondary | ICD-10-CM | POA: Insufficient documentation

## 2024-06-17 DIAGNOSIS — E039 Hypothyroidism, unspecified: Secondary | ICD-10-CM | POA: Insufficient documentation

## 2024-06-17 DIAGNOSIS — D123 Benign neoplasm of transverse colon: Secondary | ICD-10-CM

## 2024-06-17 DIAGNOSIS — K5909 Other constipation: Secondary | ICD-10-CM | POA: Insufficient documentation

## 2024-06-17 DIAGNOSIS — K760 Fatty (change of) liver, not elsewhere classified: Secondary | ICD-10-CM | POA: Insufficient documentation

## 2024-06-17 SURGERY — COLONOSCOPY
Anesthesia: General

## 2024-06-17 MED ORDER — LACTATED RINGERS IV SOLN: INTRAVENOUS | Status: DC

## 2024-06-17 MED ORDER — LIDOCAINE 2% (20 MG/ML) 5 ML SYRINGE
INTRAMUSCULAR | Status: DC | PRN
  Administered 2024-06-17: 60 mg via INTRAVENOUS

## 2024-06-17 MED ORDER — PHENYLEPHRINE 80 MCG/ML (10ML) SYRINGE FOR IV PUSH (FOR BLOOD PRESSURE SUPPORT)
PREFILLED_SYRINGE | INTRAVENOUS | Status: DC | PRN
  Administered 2024-06-17: 160 ug via INTRAVENOUS
  Administered 2024-06-17: 80 ug via INTRAVENOUS
  Administered 2024-06-17 (×2): 160 ug via INTRAVENOUS
  Administered 2024-06-17: 80 ug via INTRAVENOUS

## 2024-06-17 MED ORDER — PROPOFOL 10 MG/ML IV BOLUS
INTRAVENOUS | Status: DC | PRN
  Administered 2024-06-17: 30 mg via INTRAVENOUS
  Administered 2024-06-17: 70 mg via INTRAVENOUS
  Administered 2024-06-17: 125 ug/kg/min via INTRAVENOUS

## 2024-06-17 MED ORDER — ONDANSETRON HCL 4 MG/2ML IJ SOLN
INTRAMUSCULAR | Status: DC | PRN
  Administered 2024-06-17: 4 mg via INTRAVENOUS

## 2024-06-17 MED ORDER — EPHEDRINE SULFATE (PRESSORS) 25 MG/5ML IV SOSY
PREFILLED_SYRINGE | INTRAVENOUS | Status: DC | PRN
  Administered 2024-06-17 (×2): 10 mg via INTRAVENOUS

## 2024-06-17 NOTE — Interval H&P Note (Signed)
 History and Physical Interval Note:  06/17/2024 11:37 AM  Dawn Barnes  has presented today for surgery, with the diagnosis of ida.  The various methods of treatment have been discussed with the patient and family. After consideration of risks, benefits and other options for treatment, the patient has consented to  Procedure(s) with comments: COLONOSCOPY (N/A) - 1245pm, asa 2 as a surgical intervention.  The patient's history has been reviewed, patient examined, no change in status, stable for surgery.  I have reviewed the patient's chart and labs.  Questions were answered to the patient's satisfaction.     [name]

## 2024-06-17 NOTE — Addendum Note (Signed)
 Addendum  created 06/17/24 1405 by Para Jerelene CROME, CRNA   Clinical Note Signed

## 2024-06-17 NOTE — Anesthesia Preprocedure Evaluation (Addendum)
 Anesthesia Evaluation  Patient identified by MRN, date of birth, ID band Patient awake    Reviewed: Allergy & Precautions, H&P , NPO status , Patient's Chart, lab work & pertinent test results, reviewed documented beta blocker date and time   History of Anesthesia Complications (+) PONV and history of anesthetic complications  Airway Mallampati: II  TM Distance: >3 FB Neck ROM: full    Dental no notable dental hx. (+) Dental Advisory Given, Teeth Intact   Pulmonary neg pulmonary ROS   Pulmonary exam normal breath sounds clear to auscultation       Cardiovascular Exercise Tolerance: Good hypertension, Normal cardiovascular exam Rhythm:regular Rate:Normal     Neuro/Psych  PSYCHIATRIC DISORDERS Anxiety Depression    negative neurological ROS     GI/Hepatic Neg liver ROS,GERD  ,,pancreatitis   Endo/Other  diabetes, Type 2Hypothyroidism  Class 3 obesity  Renal/GU negative Renal ROS  negative genitourinary   Musculoskeletal   Abdominal  (+) + obese  Peds  Hematology  (+) Blood dyscrasia, anemia   Anesthesia Other Findings   Reproductive/Obstetrics negative OB ROS                              Anesthesia Physical Anesthesia Plan  ASA: 2  Anesthesia Plan: General   Post-op Pain Management: Minimal or no pain anticipated   Induction: Intravenous  PONV Risk Score and Plan: Propofol  infusion  Airway Management Planned: Natural Airway and Nasal Cannula  Additional Equipment: None  Intra-op Plan:   Post-operative Plan:   Informed Consent: I have reviewed the patients History and Physical, chart, labs and discussed the procedure including the risks, benefits and alternatives for the proposed anesthesia with the patient or authorized representative who has indicated his/her understanding and acceptance.     Dental Advisory Given  Plan Discussed with: CRNA  Anesthesia Plan Comments:           Anesthesia Quick Evaluation

## 2024-06-17 NOTE — Anesthesia Procedure Notes (Signed)
 Date/Time: 06/17/2024 12:10 PM  Performed by: Para Jerelene CROME, CRNAOxygen Delivery Method: Nasal cannula

## 2024-06-17 NOTE — Discharge Instructions (Signed)
  Colonoscopy Discharge Instructions  Read the instructions outlined below and refer to this sheet in the next few weeks. These discharge instructions provide you with general information on caring for yourself after you leave the hospital. Your doctor may also give you specific instructions. While your treatment has been planned according to the most current medical practices available, unavoidable complications occasionally occur.   ACTIVITY You may resume your regular activity, but move at a slower pace for the next 24 hours.  Take frequent rest periods for the next 24 hours.  Walking will help get rid of the air and reduce the bloated feeling in your belly (abdomen).  No driving for 24 hours (because of the medicine (anesthesia) used during the test).   Do not sign any important legal documents or operate any machinery for 24 hours (because of the anesthesia used during the test).  NUTRITION Drink plenty of fluids.  You may resume your normal diet as instructed by your doctor.  Begin with a light meal and progress to your normal diet. Heavy or fried foods are harder to digest and may make you feel sick to your stomach (nauseated).  Avoid alcoholic beverages for 24 hours or as instructed.  MEDICATIONS You may resume your normal medications unless your doctor tells you otherwise.  WHAT YOU CAN EXPECT TODAY Some feelings of bloating in the abdomen.  Passage of more gas than usual.  Spotting of blood in your stool or on the toilet paper.  IF YOU HAD POLYPS REMOVED DURING THE COLONOSCOPY: No aspirin  products for 7 days or as instructed.  No alcohol for 7 days or as instructed.  Eat a soft diet for the next 24 hours.  FINDING OUT THE RESULTS OF YOUR TEST Not all test results are available during your visit. If your test results are not back during the visit, make an appointment with your caregiver to find out the results. Do not assume everything is normal if you have not heard from your  caregiver or the medical facility. It is important for you to follow up on all of your test results.  SEEK IMMEDIATE MEDICAL ATTENTION IF: You have more than a spotting of blood in your stool.  Your belly is swollen (abdominal distention).  You are nauseated or vomiting.  You have a temperature over 101.  You have abdominal pain or discomfort that is severe or gets worse throughout the day.   Your colonoscopy revealed 9 polyp(s) which I removed successfully. Some were quite large. Await pathology results, my office will contact you. I recommend repeating colonoscopy in 1 year for surveillance purposes, depending on pathology results.  Otherwise follow up with GI in 3 months.    I hope you have a great rest of your week!  Dawn Barnes. Dawn Barnes, D.O. Gastroenterology and Hepatology [facility]

## 2024-06-17 NOTE — Anesthesia Postprocedure Evaluation (Signed)
 Anesthesia Post Note  Patient: Dawn Barnes  Procedure(s) Performed: COLONOSCOPY  Patient location during evaluation: Endoscopy Anesthesia Type: General Level of consciousness: awake and alert Pain management: pain level controlled Vital Signs Assessment: post-procedure vital signs reviewed and stable Respiratory status: spontaneous breathing, nonlabored ventilation and respiratory function stable Cardiovascular status: stable Anesthetic complications: no   There were no known notable events for this encounter.   Last Vitals:  Vitals:   06/17/24 1248 06/17/24 1255  BP: (!) 80/43 (!) 90/50  Pulse: 73 76  Resp: 19 18  Temp: 36.5 C   SpO2: 100% 99%    Last Pain:  Vitals:   06/17/24 1255  TempSrc:   PainSc: 0-No pain                 Aasim Restivo L Gaylon Melchor

## 2024-06-17 NOTE — Op Note (Signed)
 [facility] Patient Name: Dawn Barnes Procedure Date: 06/17/2024 11:58 AM MRN: [id number] Date of Birth: 11-30-1965 Attending MD: [name]. [name] , [place], [id number] CSN: [id number] Age: 58 Admit Type: Outpatient Procedure:                Colonoscopy Indications:              Iron  deficiency anemia Providers:                [name]. [name], DO, Harlene Lips, [place] Lodge Referring MD:              Medicines:                See the Anesthesia note for documentation of the                            administered medications Complications:            No immediate complications. Estimated Blood Loss:     Estimated blood loss was minimal. Procedure:                Pre-Anesthesia Assessment:                           - The anesthesia plan was to use monitored                            anesthesia care (MAC).                           After obtaining informed consent, the colonoscope                            was passed under direct vision. Throughout the                            procedure, the patient's blood pressure, pulse, and                            oxygen saturations were monitored continuously. The                            [id number] ([id number]) Peds Colon was introduced                            through the anus and advanced to the the terminal                            ileum, with identification of the appendiceal                            orifice and IC valve. The colonoscopy was performed                            without difficulty. The patient tolerated the                            procedure well.  The quality of the bowel                            preparation was evaluated using the BBPS [facility]                            Bowel Preparation Scale) with scores of: Right                            Colon = 3, Transverse Colon = 3 and Left Colon = 3                            (entire mucosa seen well with no residual staining,                            small  fragments of stool or opaque liquid). The                            total BBPS score equals 9. Scope In: 12:10:49 PM Scope Out: 12:43:42 PM Scope Withdrawal Time: 0 hours 29 minutes 3 seconds  Total Procedure Duration: 0 hours 32 minutes 53 seconds  Findings:      Non-bleeding internal hemorrhoids were found.      Two flat polyps were found in the ascending colon and cecum. The polyps       were 10 to 12 mm in size. These polyps were removed with a cold snare.       Resection and retrieval were complete.      A 15 to 18 mm polyp was found in the proximal transverse colon. The       polyp was flat. Preparations were made for mucosal resection.       Demarcation of the lesion was performed with narrow band imaging to       clearly identify the boundaries of the lesion. 2 mL of Eleview was       injected with adequate lift of the lesion from the muscularis propria.       Snare mucosal resection was performed. Resection and retrieval were       complete. Resected tissue margins were examined and clear of polyp       tissue. There was no bleeding at the end of the procedure.      Six flat and sessile polyps were found in the transverse colon. The       polyps were 5 to 15 mm in size. These polyps were removed with a cold       snare. Resection and retrieval were complete.      The terminal ileum appeared normal. Impression:               - Non-bleeding internal hemorrhoids.                           - Two 10 to 12 mm polyps in the ascending colon and                            in the cecum, removed with a cold snare. Resected  and retrieved.                           - One 15 to 18 mm polyp in the proximal transverse                            colon, removed with mucosal resection. Resected and                            retrieved.                           - Six 5 to 15 mm polyps in the transverse colon,                            removed with a cold snare.  Resected and retrieved.                           - The examined portion of the ileum was normal.                           - Mucosal resection was performed. Resection and                            retrieval were complete. Moderate Sedation:      Per Anesthesia Care Recommendation:           - Patient has a contact number available for                            emergencies. The signs and symptoms of potential                            delayed complications were discussed with the                            patient. Return to normal activities tomorrow.                            Written discharge instructions were provided to the                            patient.                           - Resume previous diet.                           - Continue present medications.                           - Await pathology results.                           - Repeat colonoscopy in 1 year for surveillance.                           -  Return to GI clinic in 3 months. Procedure Code(s):        --- Professional ---                           [phone], Colonoscopy, flexible; with endoscopic                            mucosal resection                           [phone], 59, Colonoscopy, flexible; with removal of                            tumor(s), polyp(s), or other lesion(s) by snare                            technique Diagnosis Code(s):        --- Professional ---                           D12.2, Benign neoplasm of ascending colon                           D12.0, Benign neoplasm of cecum                           D12.3, Benign neoplasm of transverse colon (hepatic                            flexure or splenic flexure)                           K64.8, Other hemorrhoids                           D50.9, Iron  deficiency anemia, unspecified CPT copyright 2022 American Medical Association. All rights reserved. The codes documented in this report are preliminary and upon coder review may  be revised to  meet current compliance requirements. [name]. [name], DO [name]. [name], DO 06/17/2024 1:41:04 PM This report has been signed electronically. Number of Addenda: 0

## 2024-06-17 NOTE — Transfer of Care (Addendum)
 Immediate Anesthesia Transfer of Care Note  Patient: Dawn Barnes  Procedure(s) Performed: COLONOSCOPY  Patient Location: Endoscopy Unit  Anesthesia Type:General  Level of Consciousness: awake and patient cooperative  Airway & Oxygen Therapy: Patient Spontanous Breathing  Post-op Assessment: Report given to RN and Post -op Vital signs reviewed and stable  Post vital signs: Reviewed and stable  Last Vitals:  Vitals Value Taken Time  BP 80/43 06/17/24   1248  Temp 36.5 06/17/24   1248  Pulse 73 06/17/24   1248  Resp 19 06/17/24   1248  SpO2 100% 06/17/24   1248    Last Pain:  Vitals:   06/17/24 1205  TempSrc:   PainSc: 0-No pain      Patients Stated Pain Goal: 3 (06/17/24 1146)  Complications: Subsequent blood pressure 90/50 on 06/17/24 at 1255.

## 2024-06-18 LAB — SURGICAL PATHOLOGY

## 2024-06-20 ENCOUNTER — Encounter (HOSPITAL_COMMUNITY): Payer: Self-pay | Admitting: Internal Medicine

## 2024-07-22 ENCOUNTER — Ambulatory Visit: Payer: Self-pay | Admitting: Internal Medicine

## 2024-07-28 ENCOUNTER — Encounter: Payer: Self-pay | Admitting: *Deleted

## 2024-07-28 NOTE — Progress Notes (Signed)
 NIC'd for colonoscopy in 1 year

## 2024-08-07 ENCOUNTER — Other Ambulatory Visit: Payer: Self-pay | Admitting: Nurse Practitioner

## 2024-08-07 NOTE — Telephone Encounter (Signed)
 No, that will go to her PCP for refill.

## 2024-08-28 ENCOUNTER — Ambulatory Visit: Admitting: Gastroenterology

## 2024-08-28 ENCOUNTER — Encounter: Payer: Self-pay | Admitting: Gastroenterology

## 2024-08-28 VITALS — BP 124/83 | HR 70 | Temp 98.6°F | Ht 64.0 in | Wt 195.2 lb

## 2024-08-28 DIAGNOSIS — K760 Fatty (change of) liver, not elsewhere classified: Secondary | ICD-10-CM | POA: Insufficient documentation

## 2024-08-28 DIAGNOSIS — K5909 Other constipation: Secondary | ICD-10-CM

## 2024-08-28 MED ORDER — IBSRELA 50 MG PO TABS: 1.0000 | ORAL_TABLET | Freq: Two times a day (BID) | ORAL | 5 refills | Status: AC

## 2024-08-28 NOTE — Progress Notes (Unsigned)
 "      Gastroenterology Office Note     Primary Care Physician:  Carlette Benita Area, MD  Primary Gastroenterologist:   Chief Complaint   Chief Complaint  Patient presents with   Follow-up    Follow up after colonoscopy and constipation. Pt has not had any constipation meds X a month. Pt has called and cancelled her Rx from [facility]. Pt doesn't know what constipation meds she is on and need help figuring it out. She has named off 3 or 4 different constipation medications. But she does know she called and cancelled one of them.     History of Present Illness   Dawn Barnes is a 59 y.o. female presenting today with a history of   GERD, chronic dysphagia, chronic constipation, hepatic steatosis, anemia with IDA component, recent colonoscopy with multiple sessile serrated adenomas with surveillance due in Nov 2026, here for follow-up.   Failed Linzess  and Amitiza  in the past. Ibsrela  too strong. Recommended Trulance . She didn't feel like this really helped. She then resumed ibsrela  at once daily dosing and did well. No diarrhea with once a day dosing.   Did ok with bowel movements after the colonoscopy but has been out of all IBS meds since that time. Has had evolving constipation since that time.    Hepatic steatosis: not ideal elastography due to data quality, no obvious fibrosis. ELF was 9.32 ( need a second NIT to confirm). Transaminases normal. FIB-4 0.96, advanced fibrosis excluded. Fibrosure without fibrosis. Wants to wait on fibroscan until we may have one here.   Anemia with worsening ferritin in June 2025. Ferritin low at 18 in Sept 2025, iron  sats low. Celiac negative. She was referred to Hematology due to recurrent IDA. She also has hx of  VIt B 12 deficiency.    Colonoscopy Nov 2025: internal hemorrhoids, two 10-12 mm polyps in ascending colon and cecum, one 15-18 mm polyp in proximal transverse colon, six 5-15 mm polyps in transverse, normal appearing  TI. Path: sessile serrated. Needs surveillance in 1 year.  EGD March 2025: small hiatal hernia, normal stomach and duodenum, empiric dilation performed.    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     Past Medical History:  Diagnosis Date   Anemia    Anxiety    Constipation 04/30/2013   Depression    Diabetes mellitus without complication (HCC)    borderline   Fibroids, intramural 01/21/2016   GERD (gastroesophageal reflux disease)    Heartburn    Hot flashes 01/11/2016   Hypertension    Hypothyroidism    Mixed incontinence    Pancreatitis    PONV (postoperative nausea and vomiting)    Pre-diabetes    PTSD (post-traumatic stress disorder)    Thyroid  disease    Trauma    Urge urinary incontinence 05/10/2015   Vaginal bleeding 01/11/2016    Past Surgical History:  Procedure Laterality Date   BALLOON DILATION N/A 10/22/2023   Procedure: BALLOON DILATION;  Surgeon: [name] [name], DO;  Location: [facility];  Service: Endoscopy;  Laterality: N/A;   BIOPSY  12/07/2015   Procedure: BIOPSY;  Surgeon: Margo LITTIE Haddock, MD;  Location: [facility];  Service: Endoscopy;;  right and left colon biopsies, gastric bx's    BLADDER REPAIR     COLONOSCOPY N/A 06/17/2024   Procedure: COLONOSCOPY;  Surgeon: [name] [name], DO;  [facility]: [facility];  Service: Endoscopy;  Laterality: N/A;  1245pm, asa 2   COLONOSCOPY     COLONOSCOPY WITH PROPOFOL  N/A 12/07/2015   Dr. Harvey: mild proctitis, hyperplastic polyp    ENDOMETRIAL ABLATION     ESOPHAGOGASTRODUODENOSCOPY (EGD) WITH PROPOFOL  N/A 12/07/2015   Dr. Harvey: possible web in proximal esophagus s/p dilation, erosive gastritis (negative H.pylori).    ESOPHAGOGASTRODUODENOSCOPY (EGD) WITH PROPOFOL  N/A 10/22/2023   Procedure: ESOPHAGOGASTRODUODENOSCOPY (EGD) WITH PROPOFOL ;  Surgeon: [name] [name], DO;   [facility]: [facility];  Service: Endoscopy;  Laterality: N/A;  11:30 AM, ASA 3   FLEXIBLE SIGMOIDOSCOPY  02/19/2024   Procedure: SIGMOIDOSCOPY, FLEXIBLE;  Surgeon: [name] [name], DO;  [facility]: [facility];  Service: Endoscopy;;   INCONTINENCE SURGERY     KNEE ARTHROSCOPY WITH MEDIAL MENISECTOMY Left 11/17/2021   Procedure: KNEE ARTHROSCOPY WITH MEDIAL MENISECTOMY;  Surgeon: Onesimo Oneil LABOR, MD;  Location: [facility];  Service: Orthopedics;  Laterality: Left;   MULTIPLE TOOTH EXTRACTIONS     SAVORY DILATION N/A 12/07/2015   Procedure: SAVORY DILATION;  Surgeon: Margo LITTIE Harvey, MD;  Location: [facility];  Service: Endoscopy;  Laterality: N/A;   TUBAL LIGATION     TYMPANOPLASTY Right 01/28/2024   Procedure: TYMPANOPLASTY;  Surgeon: Jesus Oliphant, MD;  Location: [facility];  Service: ENT;  Laterality: Right;    Current Outpatient Medications  Medication Sig Dispense Refill   cetirizine  (ZYRTEC ) 10 MG tablet Take 10 mg by mouth daily.     Cholecalciferol (VITAMIN D -3) 125 MCG (5000 UT) TABS Take 5,000 Units by mouth daily.     cyanocobalamin  (VITAMIN B12) 1000 MCG tablet Take 1,000 mcg by mouth daily.     escitalopram (LEXAPRO) 20 MG tablet Take 20 mg by mouth daily.     folic acid  (FOLVITE ) 1 MG tablet Take 1 tablet (1 mg total) by mouth daily. 30 tablet 5   hydrochlorothiazide (HYDRODIURIL) 12.5 MG tablet Take 12.5 mg by mouth daily.     levothyroxine  (SYNTHROID ) 75 MCG tablet Take 1 tablet (75 mcg total) by mouth daily before breakfast. 90 tablet 3   omeprazole (PRILOSEC) 20 MG capsule Take 40 mg by mouth daily.     Tenapanor HCl (IBSRELA ) 50 MG TABS Take 1 tablet by mouth 2 (two) times daily with a meal. 60 tablet 5   TOVIAZ 8 MG TB24 tablet Take 8 mg by mouth daily.     No current facility-administered medications for this visit.    Allergies as of 08/28/2024 - Review Complete 08/28/2024  Allergen Reaction Noted   Ibuprofen Other (See Comments) 05/30/2021    Nsaids Other (See Comments) 01/17/2016   Tuberculin tests Dermatitis 01/18/2015    Family History  Problem Relation Age of Onset   Diabetes Father    Heart disease Father    Hypertension Sister    Heart disease Sister    Anemia Sister    Cancer Maternal Uncle        pancreatic cancer   Cancer Paternal Aunt        breast cancer   Stroke Paternal Aunt    Dementia Paternal Aunt    Hypertension Paternal Aunt    Heart disease Paternal Aunt    Seizures Paternal Aunt    Breast cancer Paternal Aunt    Stroke Paternal Grandmother    Heart disease Paternal Grandmother    Depression Daughter    Depression Daughter    Depression Son    ADD / ADHD Son  Colon cancer Neg Hx    Colon polyps Neg Hx     Social History   Socioeconomic History   Marital status: Divorced    Spouse name: Not on file   Number of children: Not on file   Years of education: Not on file   Highest education level: Not on file  Occupational History   Occupation: disability  Tobacco Use   Smoking status: Never   Smokeless tobacco: Never  Vaping Use   Vaping status: Never Used  Substance and Sexual Activity   Alcohol use: No    Alcohol/week: 0.0 standard drinks of alcohol   Drug use: No   Sexual activity: Not Currently    Birth control/protection: Surgical    Comment: tubal and ablation  Other Topics Concern   Not on file  Social History Narrative   Are you right handed or left handed?    Are you currently employed ?    What is your current occupation?   Do you live at home alone?   Who lives with you?    What type of home do you live in: 1 story or 2 story? 1       Social Drivers of Health   Tobacco Use: Low Risk (08/28/2024)   Patient History    Smoking Tobacco Use: Never    Smokeless Tobacco Use: Never    Passive Exposure: Not on file  Financial Resource Strain: Low Risk (11/02/2022)   Overall Financial Resource Strain (CARDIA)    Difficulty of Paying Living Expenses: Not hard at all   Food Insecurity: No Food Insecurity (05/09/2024)   Epic    Worried About Programme Researcher, Broadcasting/film/video in the Last Year: Never true    Ran Out of Food in the Last Year: Never true  Transportation Needs: No Transportation Needs (05/09/2024)   Epic    Lack of Transportation (Medical): No    Lack of Transportation (Non-Medical): No  Physical Activity: Inactive (11/02/2022)   Exercise Vital Sign    Days of Exercise per Week: 0 days    Minutes of Exercise per Session: 10 min  Stress: Stress Concern Present (11/02/2022)   [employer] of Occupational Health - Occupational Stress Questionnaire    Feeling of Stress : To some extent  Social Connections: Socially Isolated (11/02/2022)   Social Connection and Isolation Panel    Frequency of Communication with Friends and Family: Three times a week    Frequency of Social Gatherings with Friends and Family: Once a week    Attends Religious Services: Never    Database Administrator or Organizations: No    Attends Banker Meetings: Never    Marital Status: Divorced  Catering Manager Violence: Not At Risk (05/09/2024)   Epic    Fear of Current or Ex-Partner: No    Emotionally Abused: No    Physically Abused: No    Sexually Abused: No  Depression (PHQ2-9): Low Risk (06/09/2024)   Depression (PHQ2-9)    PHQ-2 Score: 0  Alcohol Screen: Low Risk (11/02/2022)   Alcohol Screen    Last Alcohol Screening Score (AUDIT): 0  Housing: Low Risk (05/09/2024)   Epic    Unable to Pay for Housing in the Last Year: No    Number of Times Moved in the Last Year: 0    Homeless in the Last Year: No  Utilities: Not At Risk (05/09/2024)   Epic    Threatened with loss of utilities: No  Health Literacy: Not  on file     Review of Systems   Gen: Denies any fever, chills, fatigue, weight loss, lack of appetite.  CV: Denies chest pain, heart palpitations, peripheral edema, syncope.  Resp: Denies shortness of breath at rest or with exertion. Denies wheezing or  cough.  GI: Denies dysphagia or odynophagia. Denies jaundice, hematemesis, fecal incontinence. GU : Denies urinary burning, urinary frequency, urinary hesitancy MS: Denies joint pain, muscle weakness, cramps, or limitation of movement.  Derm: Denies rash, itching, dry skin Psych: Denies depression, anxiety, memory loss, and confusion Heme: Denies bruising, bleeding, and enlarged lymph nodes.   Physical Exam   BP 124/83   Pulse 70   Temp 98.6 F (37 C)   Ht 5' 4 (1.626 m)   Wt 195 lb 3.2 oz (88.5 kg)   BMI 33.51 kg/m  General:   Alert and oriented. Pleasant and cooperative. Well-nourished and well-developed.  Head:  Normocephalic and atraumatic. Eyes:  Without icterus Abdomen:  +BS, soft, non-tender and non-distended. No HSM noted. No guarding or rebound. No masses appreciated.  Rectal:  Deferred  Msk:  Symmetrical without gross deformities. Normal posture. Extremities:  Without edema. Neurologic:  Alert and  oriented x4;  grossly normal neurologically. Skin:  Intact without significant lesions or rashes. Psych:  Alert and cooperative. Normal mood and affect.   Assessment   Dawn Barnes is a 59 y.o. female presenting today with a history of    PLAN   *****    Therisa MICAEL Stager, PhD, ANP-BC [facility]     "

## 2024-08-28 NOTE — Patient Instructions (Signed)
 I have sent in Ibsrela  for you to take once each morning right before eating. We have provided samples until you can get this from the company!  We can talk about fatty liver in the future and do that special scan once we have that available here, hopefully this year.  Your next colonoscopy will be in Nov 2026  We will see you in 3 months or sooner if needed!   I enjoyed seeing you again today! I value our relationship and want to provide genuine, compassionate, and quality care. You may receive a survey regarding your visit with me, and I welcome your feedback! Thanks so much for taking the time to complete this. I look forward to seeing you again.      Therisa MICAEL Stager, PhD, ANP-BC [facility]

## 2024-08-29 ENCOUNTER — Telehealth: Payer: Self-pay

## 2024-08-29 ENCOUNTER — Other Ambulatory Visit: Payer: Self-pay

## 2024-08-29 DIAGNOSIS — K5900 Constipation, unspecified: Secondary | ICD-10-CM

## 2024-08-29 DIAGNOSIS — K5909 Other constipation: Secondary | ICD-10-CM

## 2024-08-29 DIAGNOSIS — K581 Irritable bowel syndrome with constipation: Secondary | ICD-10-CM

## 2024-08-29 MED ORDER — IBSRELA 50 MG PO TABS: 50.0000 mg | ORAL_TABLET | Freq: Every day | ORAL | Status: AC

## 2024-08-29 NOTE — Progress Notes (Unsigned)
 Medication Samples have been provided to the patient.  Drug name: IBSrela        Strength: 50mg         Qty: 2 bottles  LOT: [id number]  Exp.Date: 08/2026  Dosing instructions: 1 cap x 2 a day  The patient has been instructed regarding the correct time, dose, and frequency of taking this medication, including desired effects and most common side effects.   Jourdan Maldonado I Margurette Brener 8:32 AM 08/29/2024

## 2024-10-09 ENCOUNTER — Inpatient Hospital Stay: Attending: Oncology

## 2024-10-16 ENCOUNTER — Inpatient Hospital Stay: Admitting: Oncology

## 2024-10-21 ENCOUNTER — Inpatient Hospital Stay: Admitting: Oncology

## 2025-02-11 ENCOUNTER — Ambulatory Visit: Admitting: Nurse Practitioner
